# Patient Record
Sex: Male | Born: 1985
Health system: Southern US, Community
[De-identification: ages and names within clinical notes are randomized; demographics above are authoritative.]

## PROBLEM LIST (undated history)

## (undated) DIAGNOSIS — Z87442 Personal history of urinary calculi: Secondary | ICD-10-CM

## (undated) DIAGNOSIS — G4700 Insomnia, unspecified: Secondary | ICD-10-CM

## (undated) DIAGNOSIS — T7840XA Allergy, unspecified, initial encounter: Secondary | ICD-10-CM

## (undated) DIAGNOSIS — K219 Gastro-esophageal reflux disease without esophagitis: Secondary | ICD-10-CM

## (undated) HISTORY — DX: Gastro-esophageal reflux disease without esophagitis: K21.9

## (undated) HISTORY — DX: Personal history of urinary calculi: Z87.442

## (undated) HISTORY — DX: Allergy, unspecified, initial encounter: T78.40XA

---

## 2008-01-13 ENCOUNTER — Ambulatory Visit (HOSPITAL_COMMUNITY): Admission: EM | Admit: 2008-01-13 | Discharge: 2008-01-13 | Payer: Self-pay | Admitting: Emergency Medicine

## 2008-01-13 HISTORY — PX: FACIAL RECONSTRUCTION SURGERY: SHX631

## 2008-01-20 ENCOUNTER — Emergency Department (HOSPITAL_COMMUNITY): Admission: EM | Admit: 2008-01-20 | Discharge: 2008-01-20 | Payer: Self-pay | Admitting: Emergency Medicine

## 2010-06-07 NOTE — Op Note (Signed)
Timothy Fleming, Timothy Fleming                ACCOUNT NO.:  [id number]   MEDICAL RECORD NO.:  [id number]          PATIENT TYPE:  OBV   LOCATION:  2550                         FACILITY:  [facility]   PHYSICIAN:  Lucky Cowboy, MD         DATE OF BIRTH:  1985-12-20   DATE OF PROCEDURE:  01/13/2008  DATE OF DISCHARGE:  01/13/2008                               OPERATIVE REPORT   PREOPERATIVE DIAGNOSES:  Complex tip of nose laceration, right forehead  laceration in vertical direction, left lateral canthal lacerations  sparing the actual canthus, 1 cm deep to the muscle.   INDICATIONS:  The patient is a 25 year old male who was working on his  girlfriend's car in the [employer].  The car was suspended on  one jack and unfortunately became unstable falling on his nose, as he  was lying on the ground on his back side.  This resulted in a sharp  laceration up through both sides at the junction of the nasal ala and  the cheek.  It then transected the columella transecting the most  inferior portion of the medial crura footplate.  It also degloved the  dome from the septal cartilage.  There are lacerations extending on the  medial surfaces of the mucosa as well.  The left lateral eyelid  restoration was approximately 1 cm and through skin and underlying bone.  There was a functional component to it.  The right brow and forehead  laceration was 2 cm.   PROCEDURE:  The patient was taken to the operating room and placed on  the table in the supine position.  He was then placed under general  endotracheal anesthesia and the face was prepped with Betadine and  draped in the usual sterile fashion.  He was then rotated  counterclockwise 90 degrees.  Then 1% lidocaine with a 1:100,000 of  epinephrine was used to inject the perinasal space to ensure hemostasis.  Once this was performed, the area was copiously irrigated with normal  saline.  At this point, all cartilaginous structures were reapproximated  in a  simple interrupted fashion using 4-0 Monocryl.  This was also used  to approximate the deep subcutaneous tissues in the nasal ala and nasal  dome as well.  Other sutures used were 4-0 chromic as well as 5-0 and 6-  0 Prolene.  In this manner, the entire nose was brought back into  appropriate approximation.  On the left side, he did have an extension  of the laceration of 3 cm which was closed in a simple interrupted  fashion using 6-0 Prolene.  The forehead was closed in a simple  interrupted fashion using 5-0 Prolene.  The left eye was closed by  reapproximating subcutaneous muscle tissue using 4-0 Vicryl.  The skin  was closed in a interrupted stitch using 5-0 Prolene.  Bacitracin was  applied.  This was done to all wounds.  The patient was awakened from  anesthesia and taken to the postanesthesia care unit in stable  condition.  There were no complications.      Sera  Gerilyn Pilgrim, MD  Electronically Signed     SJ/MEDQ  D:  01/13/2008  T:  01/14/2008  Job:  [id number]   cc:   [facility]  Cherylynn Ridges, M.D.  Esther Hardy

## 2010-08-17 ENCOUNTER — Emergency Department: Payer: Self-pay | Admitting: Emergency Medicine

## 2010-10-28 LAB — POCT I-STAT 4, (NA,K, GLUC, HGB,HCT)
Hemoglobin: 15.6 g/dL (ref 13.0–17.0)
Potassium: 4.1 mEq/L (ref 3.5–5.1)
Sodium: 141 mEq/L (ref 135–145)

## 2012-04-05 ENCOUNTER — Emergency Department: Payer: Self-pay | Admitting: Emergency Medicine

## 2012-04-06 LAB — CBC
HCT: 44.8 % (ref 40.0–52.0)
HGB: 15.2 g/dL (ref 13.0–18.0)
Platelet: 407 10*3/uL (ref 150–440)
RDW: 13.3 % (ref 11.5–14.5)

## 2012-04-06 LAB — COMPREHENSIVE METABOLIC PANEL
BUN: 14 mg/dL (ref 7–18)
Bilirubin,Total: 0.6 mg/dL (ref 0.2–1.0)
Calcium, Total: 9.1 mg/dL (ref 8.5–10.1)
Creatinine: 1.27 mg/dL (ref 0.60–1.30)
EGFR (African American): >60
Osmolality: 270 (ref 275–301)
Total Protein: 8.3 g/dL — ABNORMAL HIGH (ref 6.4–8.2)

## 2012-04-06 LAB — SEDIMENTATION RATE: Erythrocyte Sed Rate: 2 mm/hr (ref 0–15)

## 2012-04-16 ENCOUNTER — Emergency Department: Payer: Self-pay | Admitting: Emergency Medicine

## 2012-04-16 LAB — HEPATIC FUNCTION PANEL A (ARMC): SGPT (ALT): 58 U/L (ref 12–78)

## 2012-04-16 LAB — BASIC METABOLIC PANEL
Anion Gap: 8 (ref 7–16)
BUN: 17 mg/dL (ref 7–18)
Chloride: 102 mmol/L (ref 98–107)
Co2: 26 mmol/L (ref 21–32)
Creatinine: 1.15 mg/dL (ref 0.60–1.30)
Glucose: 93 mg/dL (ref 65–99)
Sodium: 136 mmol/L (ref 136–145)

## 2012-04-16 LAB — CBC WITH DIFFERENTIAL/PLATELET
HCT: 45.6 % (ref 40.0–52.0)
HGB: 15.1 g/dL (ref 13.0–18.0)
Lymphocyte #: 7.8 10*3/uL — ABNORMAL HIGH (ref 1.0–3.6)
Lymphocyte %: 38.7 %
MCH: 29.6 pg (ref 26.0–34.0)
MCV: 89 fL (ref 80–100)
Neutrophil #: 10 10*3/uL — ABNORMAL HIGH (ref 1.4–6.5)

## 2012-05-20 ENCOUNTER — Ambulatory Visit: Payer: Self-pay | Admitting: Rheumatology

## 2012-11-18 ENCOUNTER — Emergency Department: Payer: Self-pay | Admitting: Emergency Medicine

## 2012-11-18 LAB — BASIC METABOLIC PANEL
Anion Gap: 7 (ref 7–16)
BUN: 14 mg/dL (ref 7–18)
Chloride: 105 mmol/L (ref 98–107)
Co2: 24 mmol/L (ref 21–32)
Creatinine: 1.21 mg/dL (ref 0.60–1.30)
EGFR (Non-African Amer.): >60
Glucose: 106 mg/dL — ABNORMAL HIGH (ref 65–99)
Osmolality: 273 (ref 275–301)
Sodium: 136 mmol/L (ref 136–145)

## 2012-11-18 LAB — CBC
MCH: 30 pg (ref 26.0–34.0)
MCHC: 34.3 g/dL (ref 32.0–36.0)
RBC: 5.06 10*6/uL (ref 4.40–5.90)
RDW: 13.2 % (ref 11.5–14.5)

## 2012-11-18 LAB — URINALYSIS, COMPLETE
Bilirubin,UR: NEGATIVE
Glucose,UR: NEGATIVE mg/dL (ref 0–75)
Hyaline Cast: 17
Squamous Epithelial: NONE SEEN

## 2014-05-15 NOTE — Consult Note (Signed)
PATIENT NAME:  Timothy Fleming, Timothy Fleming MR#:  [id number] DATE OF BIRTH:  1986/01/13  DATE OF CONSULTATION:  04/16/2012  CONSULTING PHYSICIAN:  Gladstone Lighter, MD  REQUESTING PHYSICIAN: Dr. Eula Listen.  PRIMARY CARE PHYSICIAN: [facility].   REASON FOR CONSULTATION: HELP WITH POSSIBLE ALLERGIC REACTION and dyspnea.   HISTORY OF PRESENT ILLNESS: The patient is a young, 29 year old, Caucasian male with no prior past medical history who has been having a rash on his lower extremities for the past couple of weeks, presents to the ER secondary to difficulty breathing that started this morning. The patient was doing fine up until 2 weeks ago, started with a sore throat which was presumed to be strep throat. He was started on PENICILLIN ANTIBIOTIC, IMMEDIATELY DEVELOPED URTICARIA, A RASH. He was started on a Z-Pak, but was stopped. Nonblanching, pruritic painful burning rash of his lower extremities going onto his lower abdomen. He presented to the ER. He was started on prednisone for a possible vasculitis and was advised to follow up with his PCP. His PCP has started tapering on the prednisone, and the patient just finished his prednisone yesterday. He said while on the prednisone his old rash seemed to be fading off, but he still started developing a new rash. He still does not have any rash on his upper torso, his arms, and his face. The rash on the lower extremities is burning and hurts his knees and ankles at times when he walks, and is also itching at times. He was doing fine yesterday, and this morning went to work. Felt like his throat was closing up, so he immediately went home, but he did not find anybody at home, so he panicked and felt more difficulty swallowing, could not breathe.  He does have an EpiPen at home because [place], so he immediately injected EpiPen, called his PCPs office and was advised to come to the ER. He does not have any swelling of his  oropharyngeal structures, his lips, or his throat on exam. He was tachycardic, presumably secondary to his EpiPen, but otherwise stable clinically. After a dose of Solu-Medrol, he felt he was feeling better and is able to eat solid food at this time. His labs do not show any abnormalities; and according to the patient, his rash has not progressed in the past few days. Of note, the rash is photosensitive as well.  A medical consult has been requested to see if the patient needs admission at this time. He does not have any fever or other symptoms.   PAST MEDICAL HISTORY: 1.  Lactose intolerance when a child.  2.  Seasonal allergies.  3.  Recent possible diagnosis of vasculitis with lower extremity rash.   PAST SURGICAL HISTORY: Facial reconstruction surgery after trauma.   ALLERGIES TO MEDICATIONS:  1.  PENICILLIN ALLERGY.  2.  BEE STINGS.  3.  RASH TO CODEINE.   HOME MEDICATIONS:  1.  Takes Vicodin p.r.n. for his joint pain that started in the last week.  2.  Was on prednisone and stopped yesterday as he finished his taper for possible vasculitis.  3.  Ambien at bedtime in the last couple of days, which was started for insomnia.    SOCIAL HISTORY: Lives at home with his wife. Smokes about 1/2 to 1 pack per day.  Social drinking. Works for the [employer].   FAMILY HISTORY: Seasonal allergies, hay fever in the family, and also diabetes. No stroke or cancer.  REVIEW OF SYSTEMS:  CONSTITUTIONAL: No fever, fatigue, or weakness.  EYES: No blurred vision, double vision, glaucoma or cataracts.  ENT: No tinnitus, ear pain, hearing loss, epistaxis or discharge.  RESPIRATORY: No cough, wheeze, hemoptysis or COPD.  CARDIOVASCULAR: No chest pain, orthopnea, edema, arrhythmia, palpitations, or syncope.  GASTROINTESTINAL: No nausea, vomiting, diarrhea, abdominal pain, hematemesis, or melena.  GENITOURINARY: No dysuria, hematuria, urinary incontinence or frequency.  ENDOCRINE: No polyuria,  nocturia, thyroid problems, heat or cold intolerance.  HEMATOLOGY: No anemia, easy bruising or bleeding.  SKIN: Positive for rash. No other lesions or ulcers.   MUSCULOSKELETAL: Location at ankle and knee pain. No low back pain, shoulder pain, or arthritis or gout.  NEUROLOGIC: No numbness, weakness, CVA, TIA, or seizures.  PSYCHOLOGICAL: No anxiety, insomnia, depression.   PHYSICAL EXAMINATION: VITAL SIGNS: Temperature 98.2 degrees Fahrenheit, pulse 96, respirations 32, blood pressure 152/62, pulse ox 98% on room air.  GENERAL: Well-built, well-nourished male sitting in bed, not in any acute distress.  HEENT: Normocephalic, atraumatic. Pupils equal, round, reacting to light. Anicteric sclerae. Extraocular movements intact.  OROPHARYNX: Clear. No erythema, mass or exudates. No swelling of his uvula, posterior pharyngeal structures. His tongue and his lips are within normal limits.  NECK: Supple. No thyromegaly, JVD or carotid bruits. No lymphadenopathy. No stridor.  LUNGS: Clear to auscultation. No wheeze or crackles. No use of accessory muscles for breathing.  CARDIOVASCULAR: S1, S2 regular rate and rhythm. No murmurs, rubs, or gallops.  ABDOMEN: Soft, nontender, nondistended. No hepatosplenomegaly. Normal bowel sounds.  EXTREMITIES: No pedal edema. No clubbing or cyanosis; 2+ dorsalis pedis pulses palpable bilaterally.  SKIN: There is non-blanching, purpuric rash on both lower extremities and also infraumbilical on his abdomen. Nontender to touch, but does have sensory change, sensory abnormalities including burning pain, especially on ambulating.  LYMPHATICS: No cervical or inguinal lymphadenopathy.  NEUROLOGICAL: Cranial nerves intact. No focal motor or sensory deficits.  PSYCHOLOGICAL: The patient is awake, alert, oriented x3.   LABORATORY DATA: WBC 20.1, hemoglobin 15.1, hematocrit 35.6, platelet count 511.   Sodium 136, potassium 3.4, chloride 102, bicarbonate 26, BUN 17, creatinine  1.15, glucose 93, calcium of 9.3.   Total bilirubin 0.5, ALT 58, AST 28, alk phos 84, and albumin of 4.3.   RECOMMENDATIONS: A 29 year old male with no significant medical problems other than recent diagnosis of possible vasculitis based on a rash on his lower extremities, who was on prednisone, comes to the ER secondary to difficulty breathing.  1.  Difficulty breathing: Possibly secondary to allergic reaction, rebound after he stopped prednisone, but he does not have any swelling on exam. No lip, tongue, or pharyngeal tissue swelling. Probably mild difficulty breathing, got worsened by his anxiety. Now he is able to eat well, breathe better. No abnormality on physical examination, so I am going to restart his prednisone, but he might need a vasculitis workup and possible referral to either dermatology or rheumatology if the rash does not improve. He already has a followup appointment with his PCP in 5 days, so I advised to keep that up. His labs are stable, and he is being discharged from the ER.  2.  Leukocytosis: Is secondary to steroids. Monitor while off of steroids. 3.  Possible vasculitis: Again, as mentioned above, started on prednisone. Will need referral if the rash does not improve to either dermatology or rheumatology. The patient already had PCP followup in 5 days.   Plan has been  has been discussed with the family, whether to admit  him under observation versus discharge home. Family wanted him to be discharged as they can monitor him at home, and he is otherwise clinically stable for discharge.   Total time spent on consultation: Fifty minutes.       ____________________________ Gladstone Lighter, MD rk:dm D: 04/16/2012 14:56:00 ET T: 04/16/2012 15:30:34 ET JOB#: [id number]  cc: Nelda Severe. Burt Ek, MD Gladstone Lighter, MD, <Dictator>   Gladstone Lighter MD ELECTRONICALLY SIGNED 04/26/2012 14:44

## 2014-11-04 LAB — LIPID PANEL
Cholesterol: 191 mg/dL (ref 0–200)
HDL: 37 mg/dL (ref 35–70)
LDL CALC: 125 mg/dL
TRIGLYCERIDES: 145 mg/dL (ref 40–160)

## 2014-11-04 LAB — BASIC METABOLIC PANEL: GLUCOSE: 82 mg/dL

## 2014-11-10 ENCOUNTER — Encounter: Payer: Self-pay | Admitting: Family Medicine

## 2014-11-10 ENCOUNTER — Ambulatory Visit (INDEPENDENT_AMBULATORY_CARE_PROVIDER_SITE_OTHER): Payer: BLUE CROSS/BLUE SHIELD | Admitting: Family Medicine

## 2014-11-10 ENCOUNTER — Encounter (INDEPENDENT_AMBULATORY_CARE_PROVIDER_SITE_OTHER): Payer: Self-pay

## 2014-11-10 VITALS — BP 132/84 | HR 82 | Temp 98.0°F | Resp 18 | Ht 72.0 in | Wt 253.2 lb

## 2014-11-10 DIAGNOSIS — E669 Obesity, unspecified: Secondary | ICD-10-CM | POA: Diagnosis not present

## 2014-11-10 DIAGNOSIS — Z91038 Other insect allergy status: Secondary | ICD-10-CM | POA: Diagnosis not present

## 2014-11-10 DIAGNOSIS — Z23 Encounter for immunization: Secondary | ICD-10-CM | POA: Diagnosis not present

## 2014-11-10 DIAGNOSIS — F329 Major depressive disorder, single episode, unspecified: Secondary | ICD-10-CM

## 2014-11-10 DIAGNOSIS — J302 Other seasonal allergic rhinitis: Secondary | ICD-10-CM | POA: Diagnosis not present

## 2014-11-10 DIAGNOSIS — Z720 Tobacco use: Secondary | ICD-10-CM

## 2014-11-10 DIAGNOSIS — K219 Gastro-esophageal reflux disease without esophagitis: Secondary | ICD-10-CM

## 2014-11-10 DIAGNOSIS — F418 Other specified anxiety disorders: Secondary | ICD-10-CM | POA: Diagnosis not present

## 2014-11-10 DIAGNOSIS — R0683 Snoring: Secondary | ICD-10-CM | POA: Diagnosis not present

## 2014-11-10 DIAGNOSIS — G4700 Insomnia, unspecified: Secondary | ICD-10-CM | POA: Diagnosis not present

## 2014-11-10 DIAGNOSIS — Z87442 Personal history of urinary calculi: Secondary | ICD-10-CM | POA: Insufficient documentation

## 2014-11-10 DIAGNOSIS — Z91030 Bee allergy status: Secondary | ICD-10-CM

## 2014-11-10 DIAGNOSIS — F32A Depression, unspecified: Secondary | ICD-10-CM

## 2014-11-10 DIAGNOSIS — F419 Anxiety disorder, unspecified: Secondary | ICD-10-CM

## 2014-11-10 MED ORDER — TRAZODONE HCL 50 MG PO TABS: 50.0000 mg | ORAL_TABLET | Freq: Every day | ORAL | Status: DC

## 2014-11-10 MED ORDER — BUPROPION HCL ER (XL) 150 MG PO TB24: 150.0000 mg | ORAL_TABLET | Freq: Every day | ORAL | Status: DC

## 2014-11-10 MED ORDER — EPINEPHRINE 0.3 MG/0.3ML IJ SOAJ: 0.3000 mg | Freq: Once | INTRAMUSCULAR | Status: DC

## 2014-11-10 MED ORDER — OMEPRAZOLE 20 MG PO CPDR: 20.0000 mg | DELAYED_RELEASE_CAPSULE | Freq: Two times a day (BID) | ORAL | Status: DC

## 2014-11-10 NOTE — Progress Notes (Signed)
Name: Timothy Fleming   MRN: [id number]    DOB: [birth year]   Date:11/10/2014       Progress Note  Subjective  Chief Complaint  Chief Complaint  Patient presents with  . Establish Care  . Gastroesophageal Reflux    About to run out of medication and needs refill-well controlled with medication    HPI  GERD: he states symptoms have been controlled, taking Omeprazole every am, and sometimes in pm. He denies recent heartburn or regurgitation. He tries to avoid spicy food, only has it occasionally  Tobacco use: he is trying to quit, smoking about 2-3 cigarettes daily and switching to e-cigarettes with the intension of quitting. Explained that there are no studies about e-cigarettes and tobacco cessation, but there are with nicotine patch and gum, also Zyban and also Chantix. He states the patch causes a rash, does not like to chew anything but is willing to try Chantix again.  He tried in the past, but was also working with somebody that smoked and it did not work at the time.   Seasonal Allergic Rhinitis: usually in the Spring and Fall, usually nasal and chest congestion, with itchy and watery eyes.  He usually takes prn otc medication or flonase. Not using at this time  Obesity: he eats out every day at lunch.  He does not pack his food. His BMI is high, bp slightly up but no HTN yet  Snoring: ESS of 3 - states snores at times, wakes up feeling tired  Insomnia: he has a long history of difficulty falling and staying asleep. He states it has been worse over the past 10 months when he switch jobs. 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 However more stress under the new boss. Regretting the move. Always thinking about his work. He denies being snappy at home, wife has not noticed a change in mood, but he feels stressed out.   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   Patient Active Problem List   Diagnosis Date Noted  . GERD without esophagitis 11/10/2014  . Seasonal allergic rhinitis 11/10/2014  . History of nephrolithiasis 11/10/2014  . Obesity (BMI 30-39.9) 11/10/2014  . Tobacco abuse 11/10/2014  . Snoring 11/10/2014  . Insomnia 11/10/2014    Past Surgical History  Procedure Laterality Date  . Facial reconstruction surgery  01/13/2008    After a car fell on him while working on it    Family History  Problem Relation Age of Onset  . Varicose Veins Mother   . Endometriosis Mother   . Drug abuse Father     Social History   Social History  . Marital Status: Married    Spouse Name: N/A  . Number of Children: N/A  . Years of Education: N/A   Occupational History  . Not on file.   Social History Main Topics  . Smoking status: Current Every Day Smoker -- 0.25 packs/day for 15 years    Types: Cigarettes    Start date: 11/10/1999  . Smokeless tobacco: Never Used  . Alcohol Use: No  . Drug Use: No  . Sexual Activity:    Partners: Female   Other Topics Concern  . Not on file   Social History Narrative  . No narrative on file     Current outpatient prescriptions:  .  omeprazole (PRILOSEC) 20 MG capsule, Take 1 capsule (20 mg total) by mouth 2 (two) times daily before a meal., Disp: 60 capsule, Rfl: 5 .  EPINEPHrine (EPIPEN 2-PAK) 0.3 mg/0.3 mL IJ SOAJ injection, Inject 0.3 mLs (0.3 mg total) into the muscle once., Disp: 1 Device, Rfl: 1  Allergies  Allergen Reactions  . Bee Venom Anaphylaxis, Shortness Of Breath and Swelling  . Penicillins Hives    vasculitis     ROS  Constitutional: Negative for fever or weight change.  Respiratory: Negative for cough and shortness of breath.   Cardiovascular: Negative for chest pain or palpitations.  Gastrointestinal: Negative for abdominal pain, no bowel changes.  Musculoskeletal: Negative for gait problem or joint swelling.  Skin:  Negative for rash.  Neurological: Negative for dizziness or headache.  No other specific complaints in a complete review of systems (except as listed in HPI above).  Objective  Filed Vitals:   11/10/14 1529  BP: 132/84  Pulse: 82  Temp: 98 F (36.7 C)  TempSrc: Oral  Resp: 18  Height: 6' (1.829 m)  Weight: 253 lb 3.2 oz (114.851 kg)  SpO2: 97%    Body mass index is 34.33 kg/(m^2).  Physical Exam  Constitutional: Patient appears well-developed and well-nourished. No distress.  HENT: Head: Normocephalic and atraumatic. Ears: B TMs ok, no erythema or effusion; Nose: Nose normal. Mouth/Throat: Oropharynx is clear and moist. No oropharyngeal exudate.  Eyes: Conjunctivae and EOM are normal. Pupils are equal, round, and reactive to light. No scleral icterus.  Neck: Normal range of motion. Neck supple. No JVD present. No thyromegaly present.  Cardiovascular: Normal rate, regular rhythm and normal heart sounds.  No murmur heard. No BLE edema. Pulmonary/Chest: Effort normal and breath sounds normal. No respiratory distress. Abdominal: Soft. Bowel sounds are normal, no distension. There is no tenderness. no masses Musculoskeletal: Normal range of motion, no joint effusions. No gross deformities Neurological: he is alert and oriented to person, place, and time. No cranial nerve deficit. Coordination, balance, strength, speech and gait are normal.  Skin: Skin is warm and dry. No rash noted. No erythema.  Psychiatric: Patient has a normal mood and affect. behavior is normal. Judgment and thought content normal.  PHQ2/9: Depression screen PHQ 2/9 11/10/2014  Decreased Interest 0  Down, Depressed, Hopeless 0  PHQ - 2 Score 0    Fall Risk: Fall Risk  11/10/2014  Falls in the past year? No    Functional Status Survey: Is the patient deaf or have difficulty hearing?: No Does the patient have difficulty seeing, even when wearing glasses/contacts?: Yes (glasses) Does the patient have  difficulty concentrating, remembering, or making decisions?: No Does the patient have difficulty walking or climbing stairs?: No Does the patient have difficulty dressing or bathing?: No Does the patient have difficulty doing errands alone such as visiting a doctor's office or shopping?: No   Assessment & Plan  1. GERD without esophagitis  Advised to try weaning self off, possible side effects discussed with patient - omeprazole (PRILOSEC) 20 MG capsule; Take 1 capsule (20 mg total) by mouth 2 (two) times daily before a meal.  Dispense: 60 capsule; Refill: 5  2. Needs flu shot   - Flu Vaccine QUAD 36+ mos PF IM (Fluarix & Fluzone Quad PF)  3. Seasonal allergic rhinitis  Doing well with otc medication   4. Obesity (BMI 30-39.9)  Discussed with the patient the risk posed by an increased BMI. Discussed importance of portion control, calorie counting and at  least 150 minutes of physical activity weekly. Avoid sweet beverages and drink more water. Eat at least 6 servings of fruit and vegetables daily   5. Tobacco abuse  Discussed medication, but because of stress/mild depression, we will try Wellbutrin instead of Chantix for now. Advised to pick a quit date and motivate wife to quit smoking also  6. Snoring  ESS of 3 no need for sleep study at this time  7. Insomnia  - traZODone (DESYREL) 50 MG tablet; Take 1-2 tablets (50-100 mg total) by mouth at bedtime.  Dispense: 60 tablet; Refill: 0  8. Anxiety and depression  - buPROPion (WELLBUTRIN XL) 150 MG 24 hr tablet; Take 1 tablet (150 mg total) by mouth daily.  Dispense: 30 tablet; Refill: 0  9. Bee sting allergy  - EPINEPHrine (EPIPEN 2-PAK) 0.3 mg/0.3 mL IJ SOAJ injection; Inject 0.3 mLs (0.3 mg total) into the muscle once.  Dispense: 1 Device; Refill: 1

## 2014-11-12 ENCOUNTER — Encounter: Payer: Self-pay | Admitting: Family Medicine

## 2014-11-30 DIAGNOSIS — S63502A Unspecified sprain of left wrist, initial encounter: Secondary | ICD-10-CM | POA: Insufficient documentation

## 2014-12-02 ENCOUNTER — Other Ambulatory Visit: Payer: Self-pay | Admitting: Family Medicine

## 2014-12-02 DIAGNOSIS — G4700 Insomnia, unspecified: Secondary | ICD-10-CM

## 2014-12-02 DIAGNOSIS — F419 Anxiety disorder, unspecified: Secondary | ICD-10-CM

## 2014-12-02 DIAGNOSIS — Z720 Tobacco use: Secondary | ICD-10-CM

## 2014-12-02 DIAGNOSIS — F329 Major depressive disorder, single episode, unspecified: Secondary | ICD-10-CM

## 2014-12-02 DIAGNOSIS — F32A Depression, unspecified: Secondary | ICD-10-CM

## 2014-12-02 MED ORDER — BUPROPION HCL ER (XL) 150 MG PO TB24: 150.0000 mg | ORAL_TABLET | Freq: Every day | ORAL | Status: DC

## 2014-12-02 MED ORDER — TRAZODONE HCL 50 MG PO TABS: 50.0000 mg | ORAL_TABLET | Freq: Every day | ORAL | Status: DC

## 2014-12-02 NOTE — Telephone Encounter (Signed)
Patient had appointment but had to reschedule for December due to doctor not being in the office. He will be completely out of Wellbutrin and Trazadone by his appointment time. [facility]

## 2014-12-10 ENCOUNTER — Ambulatory Visit: Payer: BLUE CROSS/BLUE SHIELD | Admitting: Family Medicine

## 2015-01-08 ENCOUNTER — Encounter: Payer: Self-pay | Admitting: Family Medicine

## 2015-01-08 ENCOUNTER — Ambulatory Visit (INDEPENDENT_AMBULATORY_CARE_PROVIDER_SITE_OTHER): Payer: BLUE CROSS/BLUE SHIELD | Admitting: Family Medicine

## 2015-01-08 DIAGNOSIS — K219 Gastro-esophageal reflux disease without esophagitis: Secondary | ICD-10-CM | POA: Diagnosis not present

## 2015-01-08 DIAGNOSIS — F32A Depression, unspecified: Secondary | ICD-10-CM

## 2015-01-08 DIAGNOSIS — Z720 Tobacco use: Secondary | ICD-10-CM | POA: Diagnosis not present

## 2015-01-08 DIAGNOSIS — F418 Other specified anxiety disorders: Secondary | ICD-10-CM

## 2015-01-08 DIAGNOSIS — G4700 Insomnia, unspecified: Secondary | ICD-10-CM

## 2015-01-08 DIAGNOSIS — F419 Anxiety disorder, unspecified: Secondary | ICD-10-CM

## 2015-01-08 DIAGNOSIS — F329 Major depressive disorder, single episode, unspecified: Secondary | ICD-10-CM

## 2015-01-08 MED ORDER — BUPROPION HCL ER (XL) 300 MG PO TB24: 300.0000 mg | ORAL_TABLET | Freq: Every day | ORAL | Status: DC

## 2015-01-08 MED ORDER — TRAZODONE HCL 50 MG PO TABS: 50.0000 mg | ORAL_TABLET | Freq: Every day | ORAL | Status: DC

## 2015-01-08 MED ORDER — OMEPRAZOLE 20 MG PO CPDR: 20.0000 mg | DELAYED_RELEASE_CAPSULE | Freq: Two times a day (BID) | ORAL | Status: DC

## 2015-01-08 MED ORDER — OMEPRAZOLE 20 MG PO CPDR: 20.0000 mg | DELAYED_RELEASE_CAPSULE | Freq: Every day | ORAL | Status: DC

## 2015-01-08 NOTE — Progress Notes (Signed)
Name: Timothy Fleming   MRN: [id number]    DOB: 10/30/1985   Date:01/08/2015       Progress Note  Subjective  Chief Complaint  Chief Complaint  Patient presents with  . Medication Refill    follow-up  . Depression    injcreased stress at work, feels med is not working well  . Gastroesophageal Reflux    Patient states working great  . Insomnia    HPI  GERD: he states symptoms have been controlled, taking Omeprazole every am, he denies any heartburn or regurgitation.  Tobacco use: he is trying to quit, smoking about 1 cigarettes daily and  e-cigarettes with the intension of quitting. Explained that there are no studies about e-cigarettes and tobacco cessation, but there are with nicotine patch and gum, also Zyban and also Chantix. He states the patch causes a rash, does not like to chew anything but is willing to try Chantix again. He was started on Wellbutrin 2 months ago and states it seems to help decrease the urge to smoke.  Insomnia: he has a long history of difficulty falling and staying asleep. He states it has been worse over the past 1 years  when he switch jobs. 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 However more stress under the new boss. Regretting the move. Always thinking about his work. He denies being snappy at home, wife has not noticed a change in mood, but he feels stressed out. He states since started on Trazodone he is able to fall and stay asleep.  Anxiety/Depression: he states symptoms have gotten bad over the past few months, they had a meeting at work with different standards and he stood up and discussed why it could not be done, since than his USS has been giving him a hard time. He is still getting along with his immediate supervisor and his direct co-worker, but is feeling threatened and paranoid about losing his job. He is trying to switch positions at work.  Explained to him it is a city job and as long as he is following the rules he will be fine. His wife is very supportive. He is worrying all the time, no energy, lack of motivation when he gets home. He had one crying spell.   Patient Active Problem List   Diagnosis Date Noted  . Sprain of left wrist 11/30/2014  . GERD without esophagitis 11/10/2014  . Seasonal allergic rhinitis 11/10/2014  . History of nephrolithiasis 11/10/2014  . Obesity (BMI 30-39.9) 11/10/2014  . Tobacco abuse 11/10/2014  . Snoring 11/10/2014  . Insomnia 11/10/2014    Past Surgical History  Procedure Laterality Date  . Facial reconstruction surgery  01/13/2008    After a car fell on him while working on it    Family History  Problem Relation Age of Onset  . Varicose Veins Mother   . Endometriosis Mother   . Drug abuse Father     Social History   Social History  . Marital Status: Married    Spouse Name: N/A  . Number of Children: N/A  . Years of Education: N/A   Occupational History  . Not on file.   Social History Main Topics  . Smoking status: Current Every Day Smoker -- 0.25 packs/day for 15 years    Types: Cigarettes    Start date: 11/10/1999  . Smokeless tobacco: Never Used  . Alcohol  Use: No  . Drug Use: No  . Sexual Activity:    Partners: Female   Other Topics Concern  . Not on file   Social History Narrative     Current outpatient prescriptions:  .  buPROPion (WELLBUTRIN XL) 300 MG 24 hr tablet, Take 1 tablet (300 mg total) by mouth daily., Disp: 90 tablet, Rfl: 0 .  EPINEPHrine (EPIPEN 2-PAK) 0.3 mg/0.3 mL IJ SOAJ injection, Inject 0.3 mLs (0.3 mg total) into the muscle once., Disp: 1 Device, Rfl: 1 .  omeprazole (PRILOSEC) 20 MG capsule, Take 1 capsule (20 mg total) by mouth 2 (two) times daily before a meal., Disp: 180 capsule, Rfl: 1 .  traZODone (DESYREL) 50 MG tablet, Take 1-2 tablets (50-100 mg total) by mouth at bedtime., Disp: 180 tablet, Rfl: 0  Allergies  Allergen  Reactions  . Bee Venom Anaphylaxis, Shortness Of Breath and Swelling  . Penicillins Hives    vasculitis     ROS  Constitutional: Negative for fever, positive for mild  weight change.  Respiratory: Negative for cough and shortness of breath.   Cardiovascular: Negative for chest pain or palpitations.  Gastrointestinal: Negative for abdominal pain, no bowel changes.  Musculoskeletal: Negative for gait problem or joint swelling.  Skin: Negative for rash.  Neurological: Negative for dizziness or headache.  No other specific complaints in a complete review of systems (except as listed in HPI above).  Objective  Filed Vitals:   01/08/15 1551  BP: 128/82  Pulse: 84  Temp: 97.5 F (36.4 C)  TempSrc: Oral  Resp: 18  Height: 6' (1.829 m)  Weight: 255 lb 1.6 oz (115.713 kg)  SpO2: 96%    Body mass index is 34.59 kg/(m^2).  Physical Exam  Constitutional: Patient appears well-developed and well-nourished. Obese  No distress.  HEENT: head atraumatic, normocephalic, pupils equal and reactive to light neck supple, throat within normal limits Cardiovascular: Normal rate, regular rhythm and normal heart sounds.  No murmur heard. No BLE edema. Pulmonary/Chest: Effort normal and breath sounds normal. No respiratory distress. Abdominal: Soft.  There is no tenderness. Psychiatric: Patient has a normal mood and affect. behavior is normal. Judgment and thought content normal.  Recent Results (from the past 2160 hour(s))  Basic metabolic panel     Status: None   Collection Time: 11/04/14 12:00 AM  Result Value Ref Range   Glucose 82 mg/dL  Lipid panel     Status: None   Collection Time: 11/04/14 12:00 AM  Result Value Ref Range   Triglycerides 145 40 - 160 mg/dL   Cholesterol 161 0 - 096 mg/dL   HDL 37 35 - 70 mg/dL   LDL Cholesterol 045 mg/dL     WUJ8/1: Depression screen PHQ 2/9 11/10/2014  Decreased Interest 0  Down, Depressed, Hopeless 0  PHQ - 2 Score 0     Fall  Risk: Fall Risk  11/10/2014  Falls in the past year? No    Assessment & Plan  1. Insomnia  Doing well - traZODone (DESYREL) 50 MG tablet; Take 1-2 tablets (50-100 mg total) by mouth at bedtime.  Dispense: 180 tablet; Refill: 0  2. Tobacco abuse  Trying to quit - buPROPion (WELLBUTRIN XL) 300 MG 24 hr tablet; Take 1 tablet (300 mg total) by mouth daily.  Dispense: 90 tablet; Refill: 0  3. Anxiety and depression  We will try higher dose of Wellbutrin XL  - buPROPion (WELLBUTRIN XL) 300 MG 24 hr tablet; Take 1 tablet (300 mg total)  by mouth daily.  Dispense: 90 tablet; Refill: 0  4. GERD without esophagitis  Well controlled - omeprazole (PRILOSEC) 20 MG capsule; Take 1 capsule (20 mg total) by mouth daily.  Dispense: 90 capsule; Refill: 0

## 2015-01-20 ENCOUNTER — Ambulatory Visit (INDEPENDENT_AMBULATORY_CARE_PROVIDER_SITE_OTHER): Payer: BLUE CROSS/BLUE SHIELD | Admitting: Family Medicine

## 2015-01-20 ENCOUNTER — Encounter: Payer: Self-pay | Admitting: Family Medicine

## 2015-01-20 VITALS — BP 120/70 | HR 84 | Temp 97.6°F | Resp 18 | Ht 72.0 in | Wt 249.3 lb

## 2015-01-20 DIAGNOSIS — J0100 Acute maxillary sinusitis, unspecified: Secondary | ICD-10-CM

## 2015-01-20 DIAGNOSIS — J209 Acute bronchitis, unspecified: Secondary | ICD-10-CM | POA: Insufficient documentation

## 2015-01-20 DIAGNOSIS — J40 Bronchitis, not specified as acute or chronic: Secondary | ICD-10-CM | POA: Diagnosis not present

## 2015-01-20 MED ORDER — HYDROCOD POLST-CPM POLST ER 10-8 MG/5ML PO SUER: 5.0000 mL | Freq: Two times a day (BID) | ORAL | Status: DC | PRN

## 2015-01-20 MED ORDER — AZITHROMYCIN 500 MG PO TABS: 500.0000 mg | ORAL_TABLET | Freq: Every day | ORAL | Status: DC

## 2015-01-20 NOTE — Patient Instructions (Signed)

## 2015-01-20 NOTE — Progress Notes (Signed)
Name: Timothy Fleming   MRN: [id number]    DOB: 02/03/1985   Date:01/20/2015       Progress Note  Subjective  Chief Complaint  Chief Complaint  Patient presents with  . URI    cough, congested, vomiting, chills for 5 days    HPI  Patient is here today with concerns regarding the following symptoms congestion, ear pressure, sinus pressure, productive cough and achiness that started 5 days ago.  Associated with fatigue and malaise. Not associated with rash, recent travel, sick contacts, nausea, diarrhea. Did cough so hard she threw up once. Has tried the following home remedies: none. Did not go to work yesterday.    Past Medical History  Diagnosis Date  . GERD (gastroesophageal reflux disease)   . Allergy     Seasonal  . History of kidney stones     x4    Social History  Substance Use Topics  . Smoking status: Current Every Day Smoker -- 0.25 packs/day for 15 years    Types: Cigarettes    Start date: 11/10/1999  . Smokeless tobacco: Never Used  . Alcohol Use: No     Current outpatient prescriptions:  .  buPROPion (WELLBUTRIN XL) 300 MG 24 hr tablet, Take 1 tablet (300 mg total) by mouth daily., Disp: 90 tablet, Rfl: 0 .  EPINEPHrine (EPIPEN 2-PAK) 0.3 mg/0.3 mL IJ SOAJ injection, Inject 0.3 mLs (0.3 mg total) into the muscle once., Disp: 1 Device, Rfl: 1 .  omeprazole (PRILOSEC) 20 MG capsule, Take 1 capsule (20 mg total) by mouth daily., Disp: 90 capsule, Rfl: 0 .  traZODone (DESYREL) 50 MG tablet, Take 1-2 tablets (50-100 mg total) by mouth at bedtime., Disp: 180 tablet, Rfl: 0  Allergies  Allergen Reactions  . Bee Venom Anaphylaxis, Shortness Of Breath and Swelling  . Penicillins Hives    vasculitis    ROS  Positive for fatigue, nasal congestion, sinus pressure, ear fullness, cough as mentioned in HPI, otherwise all systems reviewed and are negative.  Objective  Filed Vitals:   01/20/15 1009  BP: 120/70  Pulse: 84  Temp: 97.6 F (36.4 C)  TempSrc: Oral   Resp: 18  Height: 6' (1.829 m)  Weight: 249 lb 4.8 oz (113.082 kg)  SpO2: 97%   Body mass index is 33.8 kg/(m^2).   Physical Exam  Constitutional: Patient appears well-developed and well-nourished. In no acute distress but does appear to be fatigued from acute illness. HEENT:  - Head: Normocephalic and atraumatic.  - Ears: RIGHT TM bulging with minimal clear exudate, LEFT TM bulging with minimal clear exudate.  - Nose: Nasal mucosa boggy and congested.  - Mouth/Throat: Oropharynx is moist with slight erythema of bilateral tonsils without hypertrophy or exudates. Post nasal drainage present.  - Eyes: Conjunctivae clear, EOM movements normal. PERRLA. No scleral icterus.  Neck: Normal range of motion. Neck supple. No JVD present. No thyromegaly present. No local lymphadenopathy. Cardiovascular: Regular rate, regular rhythm with no murmurs heard.  Pulmonary/Chest: Effort normal and breath sounds clear in all lung fields with exception to rhonchi in upper anterior airways. Musculoskeletal: Normal range of motion bilateral UE and LE, no joint effusions. Skin: Skin is warm and dry. No rash noted. Psychiatric: Patient has a normal mood and affect. Behavior is normal in office today. Judgment and thought content normal in office today.   Assessment & Plan  1. Acute maxillary sinusitis, recurrence not specified Etiologies include initial allergic rhinitis or viral infection progressing to superimposed bacterial infection.  Instructed patient on increasing hydration, nasal saline spray, steam inhalation, NSAID if tolerated and not contraindicated. If not already doing so start taking daily anti-histamine and use a steroid nasal spray.  - azithromycin (ZITHROMAX) 500 MG tablet; Take 1 tablet (500 mg total) by mouth daily.  Dispense: 10 tablet; Refill: 0 - chlorpheniramine-HYDROcodone (TUSSIONEX PENNKINETIC ER) 10-8 MG/5ML SUER; Take 5 mLs by mouth every 12 (twelve) hours as needed for cough.   Dispense: 115 mL; Refill: 0  2. Bronchitis with bronchospasm Rest, hydration, cough medication prn.  - azithromycin (ZITHROMAX) 500 MG tablet; Take 1 tablet (500 mg total) by mouth daily.  Dispense: 10 tablet; Refill: 0 - chlorpheniramine-HYDROcodone (TUSSIONEX PENNKINETIC ER) 10-8 MG/5ML SUER; Take 5 mLs by mouth every 12 (twelve) hours as needed for cough.  Dispense: 115 mL; Refill: 0

## 2015-02-19 ENCOUNTER — Encounter: Payer: Self-pay | Admitting: Family Medicine

## 2015-02-19 ENCOUNTER — Ambulatory Visit (INDEPENDENT_AMBULATORY_CARE_PROVIDER_SITE_OTHER): Payer: BLUE CROSS/BLUE SHIELD | Admitting: Family Medicine

## 2015-02-19 VITALS — BP 116/64 | HR 96 | Temp 98.1°F | Resp 18 | Ht 72.0 in | Wt 249.7 lb

## 2015-02-19 DIAGNOSIS — K219 Gastro-esophageal reflux disease without esophagitis: Secondary | ICD-10-CM

## 2015-02-19 DIAGNOSIS — R112 Nausea with vomiting, unspecified: Secondary | ICD-10-CM

## 2015-02-19 DIAGNOSIS — G4700 Insomnia, unspecified: Secondary | ICD-10-CM

## 2015-02-19 DIAGNOSIS — F320 Major depressive disorder, single episode, mild: Secondary | ICD-10-CM | POA: Diagnosis not present

## 2015-02-19 MED ORDER — SUVOREXANT 10 MG PO TABS: 1.0000 | ORAL_TABLET | Freq: Every evening | ORAL | Status: DC

## 2015-02-19 MED ORDER — DULOXETINE HCL 30 MG PO CPEP: 30.0000 mg | ORAL_CAPSULE | Freq: Every day | ORAL | Status: DC

## 2015-02-19 NOTE — Progress Notes (Signed)
Name: Timothy Fleming   MRN: [id number]    DOB: 06-25-1985   Date:02/19/2015       Progress Note  Subjective  Chief Complaint  Chief Complaint  Patient presents with  . Follow-up    6 week F/U  . Anxiety and Depression    Worst with stress from boss, medication is not helping symptoms, can only tell if he does not take-patient will get aggravated. Would like to try another kind of medication  . Insomnia    Medication helps him go to sleep but does not stay asleep, will still wake up 4-5 times a night.  . Gastroesophageal Reflux    Symptoms are well controlled, and has had been nausea thinks due to stress.  . Nicotine Dependence    went back to smoking due to work stress    HPI   GERD: he states symptoms have been controlled, taking Omeprazole every am, he denies any heartburn or regurgitation, he has been vomiting during work days, right after his lunch, he states seems to be related to stress, since it does happen at home or during the weekends.  Tobacco use: he is trying to quit, smoking about 1 cigarettes daily and e-cigarettes with the intension of quitting. Explained that there are no studies about e-cigarettes and tobacco cessation, but there are with nicotine patch and gum, also Zyban and also Chantix. He states the patch causes a rash, does not like to chew anything but is willing to try Chantix again. He was started on Wellbutrin 3 months ago and states it seems to help decrease the urge to smoke, but is very stressed an unable to quit so far.  Insomnia: he has a long history of difficulty falling and staying asleep. He states it has been worse over the past 1 years when he switch jobs. 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 However more stress under the new boss. Regretting the move. Always thinking about his work. He denies being snappy at home, wife has not noticed a change in  mood, but he feels stressed out. He states since started on Trazodone he is able to fall but not able to stay asleep. Discussed other options we will try Belsomra.  Major Depression: he states symptoms have gotten bad over the past few months, they had a meeting at work with different standards and he stood up and discussed why it could not be done, since than his USS has been giving him a hard time. He is still getting along with his immediate supervisor and his direct co-worker, but is feeling threatened and paranoid about losing his job. He is trying to switch positions at work, he had an interview a couple of weeks ago but did not get an answer about the position yet.  His wife is very supportive. He is worrying all the time, no energy, lack of motivation when he gets home, not sleeping well, he still has episodes of crying spells. He is taking Wellbutrin, we will try Cymbalta, discussed possible side effects. He denies suicidal thoughts or ideation.    Patient Active Problem List   Diagnosis Date Noted  . Bronchitis with bronchospasm 01/20/2015  . Sprain of left wrist 11/30/2014  . GERD without esophagitis 11/10/2014  . Seasonal allergic rhinitis 11/10/2014  . History of nephrolithiasis 11/10/2014  . Obesity (BMI 30-39.9) 11/10/2014  . Tobacco abuse 11/10/2014  .  Snoring 11/10/2014  . Insomnia 11/10/2014    Past Surgical History  Procedure Laterality Date  . Facial reconstruction surgery  01/13/2008    After a car fell on him while working on it    Family History  Problem Relation Age of Onset  . Varicose Veins Mother   . Endometriosis Mother   . Drug abuse Father     Social History   Social History  . Marital Status: Married    Spouse Name: N/A  . Number of Children: N/A  . Years of Education: N/A   Occupational History  . Not on file.   Social History Main Topics  . Smoking status: Current Every Day Smoker -- 0.25 packs/day for 15 years    Types: Cigarettes     Start date: 11/10/1999  . Smokeless tobacco: Never Used  . Alcohol Use: No  . Drug Use: No  . Sexual Activity:    Partners: Female   Other Topics Concern  . Not on file   Social History Narrative     Current outpatient prescriptions:  .  EPINEPHrine (EPIPEN 2-PAK) 0.3 mg/0.3 mL IJ SOAJ injection, Inject 0.3 mLs (0.3 mg total) into the muscle once., Disp: 1 Device, Rfl: 1 .  omeprazole (PRILOSEC) 20 MG capsule, Take 1 capsule (20 mg total) by mouth daily., Disp: 90 capsule, Rfl: 0 .  DULoxetine (CYMBALTA) 30 MG capsule, Take 1 capsule (30 mg total) by mouth daily., Disp: 60 capsule, Rfl: 0 .  Suvorexant (BELSOMRA) 10 MG TABS, Take 1-2 tablets by mouth every evening., Disp: 60 tablet, Rfl: 0  Allergies  Allergen Reactions  . Bee Venom Anaphylaxis, Shortness Of Breath and Swelling  . Penicillins Hives    vasculitis     ROS  Constitutional: Negative for fever or weight change.  Respiratory: Negative for cough and shortness of breath.   Cardiovascular: Negative for chest pain or palpitations.  Gastrointestinal: Negative for abdominal pain, no bowel changes.  Musculoskeletal: Negative for gait problem or joint swelling.  Skin: Negative for rash.  Neurological: Negative for dizziness or headache.  No other specific complaints in a complete review of systems (except as listed in HPI above).  Objective  Filed Vitals:   02/19/15 0930  BP: 116/64  Pulse: 96  Temp: 98.1 F (36.7 C)  TempSrc: Oral  Resp: 18  Height: 6' (1.829 m)  Weight: 249 lb 11.2 oz (113.263 kg)  SpO2: 97%    Body mass index is 33.86 kg/(m^2).  Physical Exam  Constitutional: Patient appears well-developed and well-nourished. Obese  No distress.  HEENT: head atraumatic, normocephalic, pupils equal and reactive to light, neck supple, throat within normal limits Cardiovascular: Normal rate, regular rhythm and normal heart sounds.  No murmur heard. No BLE edema. Pulmonary/Chest: Effort normal and breath  sounds normal. No respiratory distress. Abdominal: Soft.  There is RUQ pain Psychiatric: Patient has a normal mood and affect. behavior is normal. Judgment and thought content normal.  PHQ2/9: Depression screen [facility] 2/9 01/20/2015 11/10/2014  Decreased Interest 0 0  Down, Depressed, Hopeless 0 0  PHQ - 2 Score 0 0     Fall Risk: Fall Risk  01/20/2015 11/10/2014  Falls in the past year? No No     Assessment & Plan   1. Insomnia  - Suvorexant (BELSOMRA) 10 MG TABS; Take 1-2 tablets by mouth every evening.  Dispense: 60 tablet; Refill: 0  2. Mild major depression (HCC)  We will change to Cymbalta, titrate slowly, return in a few  weeks - DULoxetine (CYMBALTA) 30 MG capsule; Take 1 capsule (30 mg total) by mouth daily.  Dispense: 60 capsule; Refill: 0  3. GERD without esophagitis  Continue medication   4. Nausea and vomiting, vomiting of unspecified type  It may be secondary to stress, only happening at work, however he has pain on RUQ during palpation , we will check [place]  - US Abdomen Limited RUQ; Future

## 2015-02-19 NOTE — Addendum Note (Signed)
Addended by: Alba Cory F on: 02/19/2015 10:02 AM   Modules accepted: Orders

## 2015-02-25 ENCOUNTER — Telehealth: Payer: Self-pay | Admitting: Family Medicine

## 2015-02-25 MED ORDER — ZOLPIDEM TARTRATE 10 MG PO TABS: 10.0000 mg | ORAL_TABLET | Freq: Every evening | ORAL | Status: DC | PRN

## 2015-02-25 NOTE — Telephone Encounter (Signed)
Okay, changed. Please call it in

## 2015-02-25 NOTE — Telephone Encounter (Signed)
Patient wife states he has never tried Ambien and would like to try it due to Insurance requesting he try it first. Please fax into his pharmacy and let him know once it is ready for pick up. Thanks.

## 2015-02-25 NOTE — Telephone Encounter (Signed)
Patient's wife was returning Tiffany's call.  Please call back.

## 2015-03-08 ENCOUNTER — Ambulatory Visit
Admission: RE | Admit: 2015-03-08 | Discharge: 2015-03-08 | Disposition: A | Discharge status: Home / Self Care | Payer: BLUE CROSS/BLUE SHIELD | Source: Ambulatory Visit | Attending: Family Medicine | Admitting: Family Medicine

## 2015-03-08 ENCOUNTER — Other Ambulatory Visit: Payer: Self-pay | Admitting: Family Medicine

## 2015-03-08 DIAGNOSIS — K760 Fatty (change of) liver, not elsewhere classified: Secondary | ICD-10-CM | POA: Insufficient documentation

## 2015-03-08 DIAGNOSIS — R112 Nausea with vomiting, unspecified: Secondary | ICD-10-CM | POA: Insufficient documentation

## 2015-03-09 ENCOUNTER — Telehealth: Payer: Self-pay | Admitting: Gastroenterology

## 2015-03-09 NOTE — Telephone Encounter (Signed)
I have called patient to make an appointment per referral to see GI for Hepatic steatosis. Patient was not available and I spoke with wife. She stated that patient would like to wait to make an appointment due to him feeling much better. I have advised her to please call our office if he wishes to see [place] in the future.

## 2015-03-12 ENCOUNTER — Ambulatory Visit: Payer: BLUE CROSS/BLUE SHIELD | Admitting: Family Medicine

## 2015-03-19 ENCOUNTER — Ambulatory Visit (INDEPENDENT_AMBULATORY_CARE_PROVIDER_SITE_OTHER): Payer: BLUE CROSS/BLUE SHIELD | Admitting: Family Medicine

## 2015-03-19 ENCOUNTER — Encounter: Payer: Self-pay | Admitting: Family Medicine

## 2015-03-19 VITALS — BP 116/68 | HR 142 | Temp 98.2°F | Resp 18 | Ht 72.0 in | Wt 242.4 lb

## 2015-03-19 DIAGNOSIS — F320 Major depressive disorder, single episode, mild: Secondary | ICD-10-CM

## 2015-03-19 DIAGNOSIS — R6883 Chills (without fever): Secondary | ICD-10-CM | POA: Diagnosis not present

## 2015-03-19 DIAGNOSIS — R000 Tachycardia, unspecified: Secondary | ICD-10-CM

## 2015-03-19 DIAGNOSIS — R634 Abnormal weight loss: Secondary | ICD-10-CM | POA: Diagnosis not present

## 2015-03-19 MED ORDER — ATENOLOL 25 MG PO TABS: 12.5000 mg | ORAL_TABLET | Freq: Two times a day (BID) | ORAL | Status: DC

## 2015-03-19 MED ORDER — DULOXETINE HCL 60 MG PO CPEP: 60.0000 mg | ORAL_CAPSULE | Freq: Every day | ORAL | Status: DC

## 2015-03-19 NOTE — Progress Notes (Signed)
Name: Timothy Fleming   MRN: [id number]    DOB: May 10, 1985   Date:03/19/2015       Progress Note  Subjective  Chief Complaint  Chief Complaint  Patient presents with  . Follow-up    3 week F/U   . Insomnia    Patient states the Ambien has helped his symptoms, takes pill and will be asleep within 35 minutes. Improving all around sleep, sleeps a total of 8 or 9 hours nightly.   . Depression    Improving symptoms and not having trouble with worrying or keeping foods down.   . Excessive Sweating    Patient is just started to noticed symptoms of excessive sweating all over his body but states his feet are staying freezing cold. Also patient is yawning alot more and having heart palpations/racing.   . Gastroesophageal Reflux    Well controlled with medication    HPI  Insomnia: doing well on Ambien, denies side effects , sleeping all night  Major Depression: he is taking Cymbalta - currently on 60 mg daily , he states he is feeling better emotionally, no crying spells, not worrying as much, helping with his performance at work also. No side effects of medication  Palpitation and sweating: he states that for the past 5 days he has noticed that his heart is racing, always sweaty, feet feels cold. On our scale he also lost 8 lbs since last visit. Normal appetite, no nausea or vomiting, no change in bowel movements. He has one episode of sharp sensation on left side of chest, lasted about 5 minutes and resolved by itself.   GERD: only has regurgitation when he over eats or eats a greasy food.   Patient Active Problem List   Diagnosis Date Noted  . Sprain of left wrist 11/30/2014  . GERD without esophagitis 11/10/2014  . Seasonal allergic rhinitis 11/10/2014  . History of nephrolithiasis 11/10/2014  . Obesity (BMI 30-39.9) 11/10/2014  . Tobacco abuse 11/10/2014  . Snoring 11/10/2014  . Insomnia 11/10/2014    Past Surgical History  Procedure Laterality Date  . Facial reconstruction  surgery  01/13/2008    After a car fell on him while working on it    Family History  Problem Relation Age of Onset  . Varicose Veins Mother   . Endometriosis Mother   . Drug abuse Father     Social History   Social History  . Marital Status: Married    Spouse Name: N/A  . Number of Children: N/A  . Years of Education: N/A   Occupational History  . Not on file.   Social History Main Topics  . Smoking status: Current Every Day Smoker -- 0.25 packs/day for 15 years    Types: Cigarettes    Start date: 11/10/1999  . Smokeless tobacco: Never Used  . Alcohol Use: No  . Drug Use: No  . Sexual Activity:    Partners: Female   Other Topics Concern  . Not on file   Social History Narrative     Current outpatient prescriptions:  .  DULoxetine (CYMBALTA) 60 MG capsule, Take 1 capsule (60 mg total) by mouth daily. First week after that 2 daily, Disp: 30 capsule, Rfl: 2 .  EPINEPHrine (EPIPEN 2-PAK) 0.3 mg/0.3 mL IJ SOAJ injection, Inject 0.3 mLs (0.3 mg total) into the muscle once., Disp: 1 Device, Rfl: 1 .  omeprazole (PRILOSEC) 20 MG capsule, Take 1 capsule (20 mg total) by mouth daily., Disp: 90 capsule, Rfl: 0 .  zolpidem (AMBIEN) 10 MG tablet, Take 1 tablet (10 mg total) by mouth at bedtime as needed for sleep., Disp: 30 tablet, Rfl: 0  Allergies  Allergen Reactions  . Bee Venom Anaphylaxis, Shortness Of Breath and Swelling  . Penicillins Hives    vasculitis     ROS  Ten systems reviewed and is negative except as mentioned in HPI   Objective  Filed Vitals:   03/19/15 1443  BP: 116/68  Pulse: 142  Temp: 98.2 F (36.8 C)  TempSrc: Oral  Resp: 18  Height: 6' (1.829 m)  Weight: 242 lb 6.4 oz (109.952 kg)  SpO2: 97%    Body mass index is 32.87 kg/(m^2).  Physical Exam  Constitutional: Patient appears well-developed and well-nourished. Obese No distress. Warm and dry skin  HEENT: head atraumatic, normocephalic, pupils equal and reactive to light, , neck  supple, throat within normal limits Cardiovascular: Increase  rate, regular rhythm and normal heart sounds.  No murmur heard. No BLE edema. Pulmonary/Chest: Effort normal and breath sounds normal. No respiratory distress. Abdominal: Soft.  There is no tenderness. Psychiatric: Patient has a normal mood and affect. behavior is normal. Judgment and thought content normal.  PHQ2/9: Depression screen [facility] 2/9 01/20/2015 11/10/2014  Decreased Interest 0 0  Down, Depressed, Hopeless 0 0  PHQ - 2 Score 0 0     Fall Risk: Fall Risk  01/20/2015 11/10/2014  Falls in the past year? No No    Assessment & Plan  1. Tachycardia  We will try Atenolol, half dose twice daily, advised to stay hydrated, and get up slowly, go to [facility] if symptoms gets worse or if chest pain for longer than 10 minutes.  - atenolol (TENORMIN) 25 MG tablet; Take 0.5 tablets (12.5 mg total) by mouth 2 (two) times daily.  Dispense: 30 tablet; Refill: 0 - EKG 12-Lead - Thyroid Panel With TSH - CBC with Differential/Platelet - Comprehensive metabolic panel  2. Weight loss  With tachycardia, check TSH, CBC  3. Chills  tachycardia  4. Mild major depression (HCC)  - DULoxetine (CYMBALTA) 60 MG capsule; Take 1 capsule (60 mg total) by mouth daily. First week after that 2 daily  Dispense: 30 capsule; Refill: 2

## 2015-03-19 NOTE — Patient Instructions (Signed)
Hyperthyroidism Hyperthyroidism is when the thyroid is too active (overactive). Your thyroid is a large gland that is located in your neck. The thyroid helps to control how your body uses food (metabolism). When your thyroid is overactive, it produces too much of a hormone called thyroxine.  CAUSES Causes of hyperthyroidism may include:  Graves disease. This is when your immune system attacks the thyroid gland. This is the most common cause.  Inflammation of the thyroid gland.  Tumor in the thyroid gland or somewhere else.  Excessive use of thyroid medicines, including:  Prescription thyroid supplement.  Herbal supplements that mimic thyroid hormones.  Solid or fluid-filled lumps within your thyroid gland (thyroid nodules).  Excessive ingestion of iodine. RISK FACTORS  Being male.  Having a family history of thyroid conditions. SIGNS AND SYMPTOMS Signs and symptoms of hyperthyroidism may include:  Nervousness.  Inability to tolerate heat.  Unexplained weight loss.  Diarrhea.  Change in the texture of hair or skin.  Heart skipping beats or making extra beats.  Rapid heart rate.  Loss of menstruation.  Shaky hands.  Fatigue.  Restlessness.  Increased appetite.  Sleep problems.  Enlarged thyroid gland or nodules. DIAGNOSIS  Diagnosis of hyperthyroidism may include:  Medical history and physical exam.  Blood tests.  Ultrasound tests. TREATMENT Treatment may include:  Medicines to control your thyroid.  Surgery to remove your thyroid.  Radiation therapy. HOME CARE INSTRUCTIONS   Take medicines only as directed by your health care provider.  Do not use any tobacco products, including cigarettes, chewing tobacco, or electronic cigarettes. If you need help quitting, ask your health care provider.  Do not exercise or do physical activity until your health care provider approves.  Keep all follow-up appointments as directed by your health care  provider. This is important. SEEK MEDICAL CARE IF:  Your symptoms do not get better with treatment.  You have fever.  You are taking thyroid replacement medicine and you:  Have depression.  Feel mentally and physically slow.  Have weight gain. SEEK IMMEDIATE MEDICAL CARE IF:   You have decreased alertness or a change in your awareness.  You have abdominal pain.  You feel dizzy.  You have a rapid heartbeat.  You have an irregular heartbeat.   This information is not intended to replace advice given to you by your health care provider. Make sure you discuss any questions you have with your health care provider.   Document Released: 01/09/2005 Document Revised: 01/30/2014 Document Reviewed: 05/27/2013 Elsevier Interactive Patient Education 2016 [employer]  

## 2015-03-20 LAB — CBC WITH DIFFERENTIAL/PLATELET
BASOS: 0 %
Basophils Absolute: 0 10*3/uL (ref 0.0–0.2)
EOS (ABSOLUTE): 0.2 10*3/uL (ref 0.0–0.4)
EOS: 1 %
HEMATOCRIT: 44.5 % (ref 37.5–51.0)
Hemoglobin: 15.9 g/dL (ref 12.6–17.7)
IMMATURE GRANS (ABS): 0.1 10*3/uL (ref 0.0–0.1)
IMMATURE GRANULOCYTES: 1 %
LYMPHS: 21 %
Lymphocytes Absolute: 3.2 10*3/uL — ABNORMAL HIGH (ref 0.7–3.1)
MCH: 31.1 pg (ref 26.6–33.0)
MCHC: 35.7 g/dL (ref 31.5–35.7)
MCV: 87 fL (ref 79–97)
MONOCYTES: 9 %
MONOS ABS: 1.5 10*3/uL — AB (ref 0.1–0.9)
NEUTROS PCT: 68 %
Neutrophils Absolute: 10.5 10*3/uL — ABNORMAL HIGH (ref 1.4–7.0)
Platelets: 418 10*3/uL — ABNORMAL HIGH (ref 150–379)
RBC: 5.11 x10E6/uL (ref 4.14–5.80)
RDW: 12.4 % (ref 12.3–15.4)
WBC: 15.4 10*3/uL — AB (ref 3.4–10.8)

## 2015-03-20 LAB — COMPREHENSIVE METABOLIC PANEL
ALBUMIN: 4.9 g/dL (ref 3.5–5.5)
ALK PHOS: 73 IU/L (ref 39–117)
ALT: 32 IU/L (ref 0–44)
AST: 23 IU/L (ref 0–40)
Albumin/Globulin Ratio: 1.8 (ref 1.1–2.5)
BILIRUBIN TOTAL: 0.5 mg/dL (ref 0.0–1.2)
BUN / CREAT RATIO: 8 (ref 8–19)
BUN: 9 mg/dL (ref 6–20)
CHLORIDE: 101 mmol/L (ref 96–106)
CO2: 22 mmol/L (ref 18–29)
CREATININE: 1.13 mg/dL (ref 0.76–1.27)
Calcium: 10 mg/dL (ref 8.7–10.2)
GFR calc non Af Amer: 87 mL/min/{1.73_m2} (ref >59)
GFR, EST AFRICAN AMERICAN: 100 mL/min/{1.73_m2} (ref >59)
GLOBULIN, TOTAL: 2.7 g/dL (ref 1.5–4.5)
GLUCOSE: 97 mg/dL (ref 65–99)
Potassium: 4.8 mmol/L (ref 3.5–5.2)
SODIUM: 144 mmol/L (ref 134–144)
Total Protein: 7.6 g/dL (ref 6.0–8.5)

## 2015-03-20 LAB — THYROID PANEL WITH TSH
Free Thyroxine Index: 2.3 (ref 1.2–4.9)
T3 Uptake Ratio: 27 % (ref 24–39)
T4, Total: 8.4 ug/dL (ref 4.5–12.0)
TSH: 1.49 u[IU]/mL (ref 0.450–4.500)

## 2015-03-21 ENCOUNTER — Other Ambulatory Visit: Payer: Self-pay | Admitting: Family Medicine

## 2015-03-21 DIAGNOSIS — R61 Generalized hyperhidrosis: Secondary | ICD-10-CM

## 2015-03-21 DIAGNOSIS — R634 Abnormal weight loss: Secondary | ICD-10-CM

## 2015-03-21 DIAGNOSIS — D72829 Elevated white blood cell count, unspecified: Secondary | ICD-10-CM

## 2015-03-23 ENCOUNTER — Ambulatory Visit
Admission: RE | Admit: 2015-03-23 | Discharge: 2015-03-23 | Disposition: A | Discharge status: Home / Self Care | Payer: BLUE CROSS/BLUE SHIELD | Source: Ambulatory Visit | Attending: Family Medicine | Admitting: Family Medicine

## 2015-03-23 ENCOUNTER — Other Ambulatory Visit: Payer: Self-pay | Admitting: Family Medicine

## 2015-03-23 DIAGNOSIS — R634 Abnormal weight loss: Secondary | ICD-10-CM | POA: Insufficient documentation

## 2015-03-23 DIAGNOSIS — D72829 Elevated white blood cell count, unspecified: Secondary | ICD-10-CM | POA: Diagnosis present

## 2015-03-24 LAB — CBC WITH DIFFERENTIAL/PLATELET
BASOS: 0 %
Basophils Absolute: 0 10*3/uL (ref 0.0–0.2)
EOS (ABSOLUTE): 0.2 10*3/uL (ref 0.0–0.4)
EOS: 2 %
HEMATOCRIT: 43.9 % (ref 37.5–51.0)
Hemoglobin: 14.9 g/dL (ref 12.6–17.7)
IMMATURE GRANS (ABS): 0.1 10*3/uL (ref 0.0–0.1)
IMMATURE GRANULOCYTES: 1 %
LYMPHS: 27 %
Lymphocytes Absolute: 2.5 10*3/uL (ref 0.7–3.1)
MCH: 30.1 pg (ref 26.6–33.0)
MCHC: 33.9 g/dL (ref 31.5–35.7)
MCV: 89 fL (ref 79–97)
Monocytes Absolute: 1 10*3/uL — ABNORMAL HIGH (ref 0.1–0.9)
Monocytes: 11 %
NEUTROS PCT: 59 %
Neutrophils Absolute: 5.5 10*3/uL (ref 1.4–7.0)
Platelets: 355 10*3/uL (ref 150–379)
RBC: 4.95 x10E6/uL (ref 4.14–5.80)
RDW: 12.8 % (ref 12.3–15.4)
WBC: 9.3 10*3/uL (ref 3.4–10.8)

## 2015-04-04 ENCOUNTER — Other Ambulatory Visit: Payer: Self-pay | Admitting: Family Medicine

## 2015-04-05 ENCOUNTER — Ambulatory Visit: Payer: BLUE CROSS/BLUE SHIELD | Admitting: Internal Medicine

## 2015-04-16 ENCOUNTER — Ambulatory Visit (INDEPENDENT_AMBULATORY_CARE_PROVIDER_SITE_OTHER): Payer: BLUE CROSS/BLUE SHIELD | Admitting: Family Medicine

## 2015-04-16 ENCOUNTER — Encounter: Payer: Self-pay | Admitting: Family Medicine

## 2015-04-16 VITALS — BP 118/74 | HR 100 | Temp 98.8°F | Resp 18 | Ht 72.0 in | Wt 247.7 lb

## 2015-04-16 DIAGNOSIS — F320 Major depressive disorder, single episode, mild: Secondary | ICD-10-CM

## 2015-04-16 DIAGNOSIS — R000 Tachycardia, unspecified: Secondary | ICD-10-CM | POA: Diagnosis not present

## 2015-04-16 DIAGNOSIS — F410 Panic disorder [episodic paroxysmal anxiety] without agoraphobia: Secondary | ICD-10-CM | POA: Diagnosis not present

## 2015-04-16 DIAGNOSIS — R072 Precordial pain: Secondary | ICD-10-CM

## 2015-04-16 MED ORDER — ALPRAZOLAM 0.5 MG PO TABS: 0.5000 mg | ORAL_TABLET | Freq: Every day | ORAL | Status: DC | PRN

## 2015-04-16 MED ORDER — ATENOLOL 25 MG PO TABS: 12.5000 mg | ORAL_TABLET | Freq: Two times a day (BID) | ORAL | Status: DC

## 2015-04-16 NOTE — Progress Notes (Signed)
Name: Timothy Fleming   MRN: [id number]    DOB: 06-Jun-1985   Date:04/16/2015       Progress Note  Subjective  Chief Complaint  Chief Complaint  Patient presents with  . Follow-up    patient was dx with having a panic attack about 2 weeks ago. was given xanax 0.5mg . has not had one since then.  . Medication Refill    needs Remus Loffler if it has not already been sent in.  Marland Kitchen Heart Problem    patient stated that since the panic attack, his heart feels like it was overworked. still feels funny.    HPI  Depression/GAD with panic attack: he was doing well Cymbalta, not worrying as much and able to be more productive but 10 days ago developed acute onset of SOB at work, co-worker called EMS and he was transported to [facility] and diagnosed with panic attack. Had EKG ( not available for review - care everywhere did not work ) but per patient it was normal. He left AMA, and went to local urgent care because of the long wait and was given alprazolam to take prn. He has only taken 4 pills in the past 10 days. Symptoms coincided with the lack of Ambien  ( because Belsomra was filled and he could not get the Ambien until today ) , he has not been able to sleep well even with otc Zyquil and if feeling more tired than usual. He is also concerned about a dull aching/soreness on left side of chest that has persisted since the first panic attack. No SOB, no calf tenderness, but he has a mild cough, that is dry from a tickle in the back of his throat.    Patient Active Problem List   Diagnosis Date Noted  . Sprain of left wrist 11/30/2014  . GERD without esophagitis 11/10/2014  . Seasonal allergic rhinitis 11/10/2014  . History of nephrolithiasis 11/10/2014  . Obesity (BMI 30-39.9) 11/10/2014  . Tobacco abuse 11/10/2014  . Snoring 11/10/2014  . Insomnia 11/10/2014    Past Surgical History  Procedure Laterality Date  . Facial reconstruction surgery  01/13/2008    After a car fell on him while working on it     Family History  Problem Relation Age of Onset  . Varicose Veins Mother   . Endometriosis Mother   . Drug abuse Father     Social History   Social History  . Marital Status: Married    Spouse Name: N/A  . Number of Children: N/A  . Years of Education: N/A   Occupational History  . Not on file.   Social History Main Topics  . Smoking status: Current Every Day Smoker -- 0.25 packs/day for 15 years    Types: Cigarettes    Start date: 11/10/1999  . Smokeless tobacco: Never Used  . Alcohol Use: No  . Drug Use: No  . Sexual Activity:    Partners: Female   Other Topics Concern  . Not on file   Social History Narrative     Current outpatient prescriptions:  .  ALPRAZolam (XANAX) 0.5 MG tablet, Take 1 tablet (0.5 mg total) by mouth daily as needed for anxiety., Disp: 30 tablet, Rfl: 0 .  atenolol (TENORMIN) 25 MG tablet, Take 0.5 tablets (12.5 mg total) by mouth 2 (two) times daily., Disp: 30 tablet, Rfl: 2 .  DULoxetine (CYMBALTA) 60 MG capsule, Take 1 capsule (60 mg total) by mouth daily. First week after that 2 daily, Disp:  30 capsule, Rfl: 2 .  EPINEPHrine (EPIPEN 2-PAK) 0.3 mg/0.3 mL IJ SOAJ injection, Inject 0.3 mLs (0.3 mg total) into the muscle once., Disp: 1 Device, Rfl: 1 .  omeprazole (PRILOSEC) 20 MG capsule, Take 1 capsule (20 mg total) by mouth daily., Disp: 90 capsule, Rfl: 0 .  zolpidem (AMBIEN) 10 MG tablet, take 1 tablet by mouth at bedtime if needed for sleep, Disp: 30 tablet, Rfl: 0  Allergies  Allergen Reactions  . Bee Venom Anaphylaxis, Shortness Of Breath and Swelling  . Penicillins Hives    vasculitis     ROS  Ten systems reviewed and is negative except as mentioned in HPI   Objective  Filed Vitals:   04/16/15 1455  BP: 118/74  Pulse: 100  Temp: 98.8 F (37.1 C)  TempSrc: Oral  Resp: 18  Height: 6' (1.829 m)  Weight: 247 lb 11.2 oz (112.356 kg)  SpO2: 98%    Body mass index is 33.59 kg/(m^2).  Physical Exam  Constitutional:  Patient appears well-developed and well-nourished. Obese  No distress.  HEENT: head atraumatic, normocephalic, pupils equal and reactive to light, neck supple, throat within normal limits Cardiovascular: Normal rate, regular rhythm and normal heart sounds.  No murmur heard. No BLE edema. Pulmonary/Chest: Effort normal and breath sounds normal. No respiratory distress. Abdominal: Soft.  There is no tenderness. Psychiatric: Patient has a normal mood and affect. behavior is normal. Judgment and thought content normal.  Recent Results (from the past 2160 hour(s))  Thyroid Panel With TSH     Status: None   Collection Time: 03/19/15  3:21 PM  Result Value Ref Range   TSH 1.490 0.450 - 4.500 uIU/mL   T4, Total 8.4 4.5 - 12.0 ug/dL   T3 Uptake Ratio 27 24 - 39 %   Free Thyroxine Index 2.3 1.2 - 4.9  CBC with Differential/Platelet     Status: Abnormal   Collection Time: 03/19/15  3:21 PM  Result Value Ref Range   WBC 15.4 (H) 3.4 - 10.8 x10E3/uL   RBC 5.11 4.14 - 5.80 x10E6/uL   Hemoglobin 15.9 12.6 - 17.7 g/dL   Hematocrit 16.1 09.6 - 51.0 %   MCV 87 79 - 97 fL   MCH 31.1 26.6 - 33.0 pg   MCHC 35.7 31.5 - 35.7 g/dL   RDW 04.5 40.9 - 81.1 %   Platelets 418 (H) 150 - 379 x10E3/uL   Neutrophils 68 %   Lymphs 21 %   Monocytes 9 %   Eos 1 %   Basos 0 %   Neutrophils Absolute 10.5 (H) 1.4 - 7.0 x10E3/uL   Lymphocytes Absolute 3.2 (H) 0.7 - 3.1 x10E3/uL   Monocytes Absolute 1.5 (H) 0.1 - 0.9 x10E3/uL   EOS (ABSOLUTE) 0.2 0.0 - 0.4 x10E3/uL   Basophils Absolute 0.0 0.0 - 0.2 x10E3/uL   Immature Granulocytes 1 %   Immature Grans (Abs) 0.1 0.0 - 0.1 x10E3/uL  Comprehensive metabolic panel     Status: None   Collection Time: 03/19/15  3:21 PM  Result Value Ref Range   Glucose 97 65 - 99 mg/dL   BUN 9 6 - 20 mg/dL   Creatinine, Ser 9.14 0.76 - 1.27 mg/dL   GFR calc non Af Amer 87 >59 mL/min/1.73   GFR calc Af Amer 100 >59 mL/min/1.73   BUN/Creatinine Ratio 8 8 - 19   Sodium 144 134 - 144  mmol/L   Potassium 4.8 3.5 - 5.2 mmol/L   Chloride 101 96 -  106 mmol/L   CO2 22 18 - 29 mmol/L   Calcium 10.0 8.7 - 10.2 mg/dL   Total Protein 7.6 6.0 - 8.5 g/dL   Albumin 4.9 3.5 - 5.5 g/dL   Globulin, Total 2.7 1.5 - 4.5 g/dL   Albumin/Globulin Ratio 1.8 1.1 - 2.5    Comment: **Effective April 05, 2015 the reference interval**   for A/G Ratio will be changing to:              Age                Male          Male           0 -  7 days       1.1 - 2.3       1.1 - 2.3           8 - 30 days       1.2 - 2.8       1.2 - 2.8           1 -  6 months     1.3 - 3.6       1.3 - 3.6    7 months -  5 years      1.5 - 2.6       1.5 - 2.6              > 5 years      1.2 - 2.2       1.2 - 2.2    Bilirubin Total 0.5 0.0 - 1.2 mg/dL   Alkaline Phosphatase 73 39 - 117 IU/L   AST 23 0 - 40 IU/L   ALT 32 0 - 44 IU/L  CBC with Differential/Platelet     Status: Abnormal   Collection Time: 03/23/15  8:20 AM  Result Value Ref Range   WBC 9.3 3.4 - 10.8 x10E3/uL   RBC 4.95 4.14 - 5.80 x10E6/uL   Hemoglobin 14.9 12.6 - 17.7 g/dL   Hematocrit 16.1 09.6 - 51.0 %   MCV 89 79 - 97 fL   MCH 30.1 26.6 - 33.0 pg   MCHC 33.9 31.5 - 35.7 g/dL   RDW 04.5 40.9 - 81.1 %   Platelets 355 150 - 379 x10E3/uL   Neutrophils 59 %   Lymphs 27 %   Monocytes 11 %   Eos 2 %   Basos 0 %   Neutrophils Absolute 5.5 1.4 - 7.0 x10E3/uL   Lymphocytes Absolute 2.5 0.7 - 3.1 x10E3/uL   Monocytes Absolute 1.0 (H) 0.1 - 0.9 x10E3/uL   EOS (ABSOLUTE) 0.2 0.0 - 0.4 x10E3/uL   Basophils Absolute 0.0 0.0 - 0.2 x10E3/uL   Immature Granulocytes 1 %   Immature Grans (Abs) 0.1 0.0 - 0.1 x10E3/uL     PHQ2/9: Depression screen [facility] 2/9 04/16/2015 01/20/2015 11/10/2014  Decreased Interest 0 0 0  Down, Depressed, Hopeless 1 0 0  PHQ - 2 Score 1 0 0    Fall Risk: Fall Risk  04/16/2015 01/20/2015 11/10/2014  Falls in the past year? No No No    Functional Status Survey: Is the patient deaf or have difficulty hearing?: No Does  the patient have difficulty seeing, even when wearing glasses/contacts?: No Does the patient have difficulty concentrating, remembering, or making decisions?: No Does the patient have difficulty walking or climbing stairs?: No Does the patient have difficulty dressing or bathing?: No Does the patient have difficulty doing  errands alone such as visiting a doctor's office or shopping?: No   Assessment & Plan  1. Mild major depression (HCC)  Continue Duloxetine  2. Panic attack  Explained he can only take it for panic attacks, must last at least 3 months - ALPRAZolam (XANAX) 0.5 MG tablet; Take 1 tablet (0.5 mg total) by mouth daily as needed for anxiety.  Dispense: 30 tablet; Refill: 0  3. Tachycardia  Improved with Atenolol  - atenolol (TENORMIN) 25 MG tablet; Take 0.5 tablets (12.5 mg total) by mouth 2 (two) times daily.  Dispense: 30 tablet; Refill: 2  4. Precordial pain  - EKG 12-Lead

## 2015-05-07 ENCOUNTER — Other Ambulatory Visit: Payer: Self-pay | Admitting: Family Medicine

## 2015-05-07 NOTE — Telephone Encounter (Signed)
Patient requesting refill. 

## 2015-05-11 ENCOUNTER — Other Ambulatory Visit: Payer: Self-pay | Admitting: Family Medicine

## 2015-05-12 NOTE — Telephone Encounter (Signed)
Patient requesting refill. 

## 2015-05-20 ENCOUNTER — Ambulatory Visit (INDEPENDENT_AMBULATORY_CARE_PROVIDER_SITE_OTHER): Payer: BLUE CROSS/BLUE SHIELD | Admitting: Family Medicine

## 2015-05-20 ENCOUNTER — Encounter: Payer: Self-pay | Admitting: Family Medicine

## 2015-05-20 VITALS — BP 116/72 | HR 101 | Temp 98.8°F | Resp 18 | Ht 72.0 in | Wt 248.6 lb

## 2015-05-20 DIAGNOSIS — R21 Rash and other nonspecific skin eruption: Secondary | ICD-10-CM | POA: Diagnosis not present

## 2015-05-20 DIAGNOSIS — F320 Major depressive disorder, single episode, mild: Secondary | ICD-10-CM | POA: Diagnosis not present

## 2015-05-20 DIAGNOSIS — G4700 Insomnia, unspecified: Secondary | ICD-10-CM

## 2015-05-20 DIAGNOSIS — Z872 Personal history of diseases of the skin and subcutaneous tissue: Secondary | ICD-10-CM

## 2015-05-20 DIAGNOSIS — R000 Tachycardia, unspecified: Secondary | ICD-10-CM | POA: Diagnosis not present

## 2015-05-20 DIAGNOSIS — F410 Panic disorder [episodic paroxysmal anxiety] without agoraphobia: Secondary | ICD-10-CM | POA: Diagnosis not present

## 2015-05-20 MED ORDER — ATENOLOL 25 MG PO TABS: 12.5000 mg | ORAL_TABLET | Freq: Two times a day (BID) | ORAL | Status: DC

## 2015-05-20 MED ORDER — PREDNISONE 10 MG (48) PO TBPK: ORAL_TABLET | Freq: Every day | ORAL | Status: DC

## 2015-05-20 MED ORDER — ZOLPIDEM TARTRATE 10 MG PO TABS: ORAL_TABLET | ORAL | Status: DC

## 2015-05-20 MED ORDER — DULOXETINE HCL 60 MG PO CPEP: ORAL_CAPSULE | ORAL | Status: DC

## 2015-05-20 MED ORDER — ALPRAZOLAM 0.5 MG PO TABS: 0.5000 mg | ORAL_TABLET | Freq: Every day | ORAL | Status: DC | PRN

## 2015-05-20 NOTE — Progress Notes (Signed)
Name: Timothy Fleming   MRN: [id number]    DOB: Jun 12, 1985   Date:05/20/2015       Progress Note  Subjective  Chief Complaint  Chief Complaint  Patient presents with  . Depression    mild major. patient did not receive enough quantity for him to take BID  . Tachycardia  . precordial pain  . Panic Attack  . Rash    patient has noticed a rash bilateral (predominantly on left) feet. some itching, but no burning. hx of vasculitis    HPI  Depression/GAD with panic attack: he has been doing well on Cymbalta, not worrying as much and able to be more productive . He is still under a lot of stress at work, but able to function. He has noticed that he has been taking Alprazolam almost daily for anxiety, advised to make 45 pills last 3 months because of potential of addiction  Insomnia: doing well on Ambien , able to fall and stay asleep  Tachycardia: improved with Atenolol, takes when he gets at work because it makes him a little sleepy when he first takes it  Rash: he had a severe episode of vasculitis in 2014 and had to be hospitalized in 2014. He has noticed a rash on dorsal aspect of his left foot this am, mild pruritus this am, not progressing like it did in the past. No fatigue or fever associated with it. No blood in stools or urine.   Patient Active Problem List   Diagnosis Date Noted  . History of vasculitis of skin 05/20/2015  . Sprain of left wrist 11/30/2014  . GERD without esophagitis 11/10/2014  . Seasonal allergic rhinitis 11/10/2014  . History of nephrolithiasis 11/10/2014  . Obesity (BMI 30-39.9) 11/10/2014  . Tobacco abuse 11/10/2014  . Snoring 11/10/2014  . Insomnia 11/10/2014    Past Surgical History  Procedure Laterality Date  . Facial reconstruction surgery  01/13/2008    After a car fell on him while working on it    Family History  Problem Relation Age of Onset  . Varicose Veins Mother   . Endometriosis Mother   . Drug abuse Father     Social History    Social History  . Marital Status: Married    Spouse Name: N/A  . Number of Children: N/A  . Years of Education: N/A   Occupational History  . Not on file.   Social History Main Topics  . Smoking status: Current Every Day Smoker -- 0.25 packs/day for 15 years    Types: Cigarettes    Start date: 11/10/1999  . Smokeless tobacco: Never Used  . Alcohol Use: No  . Drug Use: No  . Sexual Activity:    Partners: Female   Other Topics Concern  . Not on file   Social History Narrative     Current outpatient prescriptions:  .  ALPRAZolam (XANAX) 0.5 MG tablet, Take 1 tablet (0.5 mg total) by mouth daily as needed for anxiety., Disp: 30 tablet, Rfl: 0 .  atenolol (TENORMIN) 25 MG tablet, Take 0.5 tablets (12.5 mg total) by mouth 2 (two) times daily., Disp: 90 tablet, Rfl: 0 .  DULoxetine (CYMBALTA) 60 MG capsule, Daily, Disp: 90 capsule, Rfl: 0 .  omeprazole (PRILOSEC) 20 MG capsule, Take 1 capsule (20 mg total) by mouth daily., Disp: 90 capsule, Rfl: 0 .  zolpidem (AMBIEN) 10 MG tablet, take 1 tablet by mouth at bedtime if needed for sleep, Disp: 90 tablet, Rfl: 0 .  EPINEPHrine (EPIPEN 2-PAK) 0.3 mg/0.3 mL IJ SOAJ injection, Inject 0.3 mLs (0.3 mg total) into the muscle once. (Patient not taking: Reported on 05/20/2015), Disp: 1 Device, Rfl: 1 .  predniSONE (STERAPRED UNI-PAK 48 TAB) 10 MG (48) TBPK tablet, Take by mouth daily., Disp: 48 tablet, Rfl: 0  Allergies  Allergen Reactions  . Bee Venom Anaphylaxis, Shortness Of Breath and Swelling  . Penicillins Hives    vasculitis     ROS  Ten systems reviewed and is negative except as mentioned in HPI  Chest pain resolved  Objective  Filed Vitals:   05/20/15 1131  BP: 116/72  Pulse: 101  Temp: 98.8 F (37.1 C)  TempSrc: Oral  Resp: 18  Height: 6' (1.829 m)  Weight: 248 lb 9.6 oz (112.764 kg)  SpO2: 97%    Body mass index is 33.71 kg/(m^2).  Physical Exam   Constitutional: Patient appears well-developed and  well-nourished. Obese No distress.  HEENT: head atraumatic, normocephalic, pupils equal and reactive to light,, neck supple, throat within normal limits Cardiovascular: Normal rate, regular rhythm and normal heart sounds.  No murmur heard. No BLE edema. Pulmonary/Chest: Effort normal and breath sounds normal. No respiratory distress. Abdominal: Soft.  There is no tenderness. Psychiatric: Patient has a normal mood and affect. behavior is normal. Judgment and thought content normal. Skin: petechiae on left dorsal foot, it does not blanch.   Recent Results (from the past 2160 hour(s))  Thyroid Panel With TSH     Status: None   Collection Time: 03/19/15  3:21 PM  Result Value Ref Range   TSH 1.490 0.450 - 4.500 uIU/mL   T4, Total 8.4 4.5 - 12.0 ug/dL   T3 Uptake Ratio 27 24 - 39 %   Free Thyroxine Index 2.3 1.2 - 4.9  CBC with Differential/Platelet     Status: Abnormal   Collection Time: 03/19/15  3:21 PM  Result Value Ref Range   WBC 15.4 (H) 3.4 - 10.8 x10E3/uL   RBC 5.11 4.14 - 5.80 x10E6/uL   Hemoglobin 15.9 12.6 - 17.7 g/dL   Hematocrit 60.4 54.0 - 51.0 %   MCV 87 79 - 97 fL   MCH 31.1 26.6 - 33.0 pg   MCHC 35.7 31.5 - 35.7 g/dL   RDW 98.1 19.1 - 47.8 %   Platelets 418 (H) 150 - 379 x10E3/uL   Neutrophils 68 %   Lymphs 21 %   Monocytes 9 %   Eos 1 %   Basos 0 %   Neutrophils Absolute 10.5 (H) 1.4 - 7.0 x10E3/uL   Lymphocytes Absolute 3.2 (H) 0.7 - 3.1 x10E3/uL   Monocytes Absolute 1.5 (H) 0.1 - 0.9 x10E3/uL   EOS (ABSOLUTE) 0.2 0.0 - 0.4 x10E3/uL   Basophils Absolute 0.0 0.0 - 0.2 x10E3/uL   Immature Granulocytes 1 %   Immature Grans (Abs) 0.1 0.0 - 0.1 x10E3/uL  Comprehensive metabolic panel     Status: None   Collection Time: 03/19/15  3:21 PM  Result Value Ref Range   Glucose 97 65 - 99 mg/dL   BUN 9 6 - 20 mg/dL   Creatinine, Ser 2.95 0.76 - 1.27 mg/dL   GFR calc non Af Amer 87 >59 mL/min/1.73   GFR calc Af Amer 100 >59 mL/min/1.73   BUN/Creatinine Ratio 8 8 - 19    Sodium 144 134 - 144 mmol/L   Potassium 4.8 3.5 - 5.2 mmol/L   Chloride 101 96 - 106 mmol/L   CO2 22 18 - 29  mmol/L   Calcium 10.0 8.7 - 10.2 mg/dL   Total Protein 7.6 6.0 - 8.5 g/dL   Albumin 4.9 3.5 - 5.5 g/dL   Globulin, Total 2.7 1.5 - 4.5 g/dL   Albumin/Globulin Ratio 1.8 1.1 - 2.5    Comment: **Effective April 05, 2015 the reference interval**   for A/G Ratio will be changing to:              Age                Male          Male           0 -  7 days       1.1 - 2.3       1.1 - 2.3           8 - 30 days       1.2 - 2.8       1.2 - 2.8           1 -  6 months     1.3 - 3.6       1.3 - 3.6    7 months -  5 years      1.5 - 2.6       1.5 - 2.6              > 5 years      1.2 - 2.2       1.2 - 2.2    Bilirubin Total 0.5 0.0 - 1.2 mg/dL   Alkaline Phosphatase 73 39 - 117 IU/L   AST 23 0 - 40 IU/L   ALT 32 0 - 44 IU/L  CBC with Differential/Platelet     Status: Abnormal   Collection Time: 03/23/15  8:20 AM  Result Value Ref Range   WBC 9.3 3.4 - 10.8 x10E3/uL   RBC 4.95 4.14 - 5.80 x10E6/uL   Hemoglobin 14.9 12.6 - 17.7 g/dL   Hematocrit 16.143.9 09.637.5 - 51.0 %   MCV 89 79 - 97 fL   MCH 30.1 26.6 - 33.0 pg   MCHC 33.9 31.5 - 35.7 g/dL   RDW 04.512.8 40.912.3 - 81.115.4 %   Platelets 355 150 - 379 x10E3/uL   Neutrophils 59 %   Lymphs 27 %   Monocytes 11 %   Eos 2 %   Basos 0 %   Neutrophils Absolute 5.5 1.4 - 7.0 x10E3/uL   Lymphocytes Absolute 2.5 0.7 - 3.1 x10E3/uL   Monocytes Absolute 1.0 (H) 0.1 - 0.9 x10E3/uL   EOS (ABSOLUTE) 0.2 0.0 - 0.4 x10E3/uL   Basophils Absolute 0.0 0.0 - 0.2 x10E3/uL   Immature Granulocytes 1 %   Immature Grans (Abs) 0.1 0.0 - 0.1 x10E3/uL      PHQ2/9: [facility] 2/9 05/20/2015 04/16/2015 01/20/2015 11/10/2014  Decreased Interest 0 0 0 0  Down, Depressed, Hopeless 1 1 0 0  PHQ - 2 Score 1 1 0 0     Fall Risk: Fall Risk  05/20/2015 04/16/2015 01/20/2015 11/10/2014  Falls in the past year? No No No No      Functional Status  Survey: Is the patient deaf or have difficulty hearing?: No Does the patient have difficulty seeing, even when wearing glasses/contacts?: No Does the patient have difficulty concentrating, remembering, or making decisions?: No Does the patient have difficulty walking or climbing stairs?: No Does the patient have difficulty dressing or bathing?: No Does the patient have difficulty  doing errands alone such as visiting a doctor's office or shopping?: No    Assessment & Plan  1. Mild major depression (HCC)  Doing better, still stressed at work  - DULoxetine (CYMBALTA) 60 MG capsule; Daily  Dispense: 90 capsule; Refill: 0  2. History of vasculitis of skin  - predniSONE (STERAPRED UNI-PAK 48 TAB) 10 MG (48) TBPK tablet; Take by mouth daily.  Dispense: 48 tablet; Refill: 0  3. Rash  Because of history of vasculitis we will start prednisone taper right away, get records from [facility] - admitted in 2014 - predniSONE (STERAPRED UNI-PAK 48 TAB) 10 MG (48) TBPK tablet; Take by mouth daily.  Dispense: 48 tablet; Refill: 0  4. Tachycardia  - atenolol (TENORMIN) 25 MG tablet; Take 0.5 tablets (12.5 mg total) by mouth 2 (two) times daily.  Dispense: 90 tablet; Refill: 0  5. Insomnia  - zolpidem (AMBIEN) 10 MG tablet; take 1 tablet by mouth at bedtime if needed for sleep  Dispense: 90 tablet; Refill: 0

## 2015-07-08 ENCOUNTER — Encounter: Payer: Self-pay | Admitting: Family Medicine

## 2015-07-08 ENCOUNTER — Ambulatory Visit (INDEPENDENT_AMBULATORY_CARE_PROVIDER_SITE_OTHER): Payer: BLUE CROSS/BLUE SHIELD | Admitting: Family Medicine

## 2015-07-08 VITALS — BP 122/64 | HR 78 | Temp 98.3°F | Resp 18 | Wt 252.9 lb

## 2015-07-08 DIAGNOSIS — M6248 Contracture of muscle, other site: Secondary | ICD-10-CM

## 2015-07-08 DIAGNOSIS — M62838 Other muscle spasm: Secondary | ICD-10-CM

## 2015-07-08 DIAGNOSIS — M62830 Muscle spasm of back: Secondary | ICD-10-CM

## 2015-07-08 DIAGNOSIS — F320 Major depressive disorder, single episode, mild: Secondary | ICD-10-CM | POA: Diagnosis not present

## 2015-07-08 MED ORDER — METAXALONE 800 MG PO TABS: 800.0000 mg | ORAL_TABLET | Freq: Three times a day (TID) | ORAL | Status: DC

## 2015-07-08 MED ORDER — NAPROXEN 500 MG PO TABS: 500.0000 mg | ORAL_TABLET | Freq: Two times a day (BID) | ORAL | Status: DC

## 2015-07-08 NOTE — Progress Notes (Signed)
Name: Timothy Fleming   MRN: [id number]    DOB: 04/03/1985   Date:07/08/2015       Progress Note  Subjective  Chief Complaint  Chief Complaint  Patient presents with  . Shoulder Injury    patient presents today with an shoulder/back injury about 2 weeks ago. he is not sure how it happened. patient has tried otc Aleve but it has not helped.   . Back Pain    patient stated that the pain is between his shoulder blades then goes to arm.    HPI  Neck and Back Spasm: he states that 2 weeks ago he developed pain on left side of neck and upper back. Happened suddenly and not sure if triggered by activity at work or at home. He has been taking otc medication without any help. Pain is described as dull constant pain with episodes of acute sharp pain. Still going to work but picking up debris makes symptoms worse. No weakness or tingling on back of left hand.   Depression: feeling more stressed, got wrote up at work because some sewage backed up in a ladies house, and he is more stressed at work. He is still taking medication as prescribed. Denies suicidal thoughts or ideation  Patient Active Problem List   Diagnosis Date Noted  . History of vasculitis of skin 05/20/2015  . Sprain of left wrist 11/30/2014  . GERD without esophagitis 11/10/2014  . Seasonal allergic rhinitis 11/10/2014  . History of nephrolithiasis 11/10/2014  . Obesity (BMI 30-39.9) 11/10/2014  . Tobacco abuse 11/10/2014  . Snoring 11/10/2014  . Insomnia 11/10/2014    Past Surgical History  Procedure Laterality Date  . Facial reconstruction surgery  01/13/2008    After a car fell on him while working on it    Family History  Problem Relation Age of Onset  . Varicose Veins Mother   . Endometriosis Mother   . Drug abuse Father     Social History   Social History  . Marital Status: Married    Spouse Name: N/A  . Number of Children: N/A  . Years of Education: N/A   Occupational History  . Not on file.   Social  History Main Topics  . Smoking status: Current Every Day Smoker -- 0.25 packs/day for 15 years    Types: Cigarettes    Start date: 11/10/1999  . Smokeless tobacco: Never Used  . Alcohol Use: No  . Drug Use: No  . Sexual Activity:    Partners: Female   Other Topics Concern  . Not on file   Social History Narrative     Current outpatient prescriptions:  .  ALPRAZolam (XANAX) 0.5 MG tablet, Take 1 tablet (0.5 mg total) by mouth daily as needed for anxiety., Disp: 45 tablet, Rfl: 0 .  atenolol (TENORMIN) 25 MG tablet, Take 0.5 tablets (12.5 mg total) by mouth 2 (two) times daily., Disp: 90 tablet, Rfl: 0 .  DULoxetine (CYMBALTA) 60 MG capsule, Daily, Disp: 90 capsule, Rfl: 0 .  EPINEPHrine (EPIPEN 2-PAK) 0.3 mg/0.3 mL IJ SOAJ injection, Inject 0.3 mLs (0.3 mg total) into the muscle once. (Patient not taking: Reported on 05/20/2015), Disp: 1 Device, Rfl: 1 .  metaxalone (SKELAXIN) 800 MG tablet, Take 1 tablet (800 mg total) by mouth 3 (three) times daily., Disp: 90 tablet, Rfl: 0 .  naproxen (NAPROSYN) 500 MG tablet, Take 1 tablet (500 mg total) by mouth 2 (two) times daily with a meal., Disp: 30 tablet, Rfl: 0 .  omeprazole (PRILOSEC) 20 MG capsule, Take 1 capsule (20 mg total) by mouth daily., Disp: 90 capsule, Rfl: 0 .  zolpidem (AMBIEN) 10 MG tablet, take 1 tablet by mouth at bedtime if needed for sleep, Disp: 90 tablet, Rfl: 0  Allergies  Allergen Reactions  . Bee Venom Anaphylaxis, Shortness Of Breath and Swelling  . Penicillins Hives    vasculitis     ROS  Ten systems reviewed and is negative except as mentioned in HPI   Objective  Filed Vitals:   07/08/15 1132  BP: 122/64  Pulse: 78  Temp: 98.3 F (36.8 C)  TempSrc: Oral  Resp: 18  Weight: 252 lb 14.4 oz (114.715 kg)  SpO2: 97%    Body mass index is 34.29 kg/(m^2).  Physical Exam  Constitutional: Patient appears well-developed and well-nourished. Obese  No distress.  HEENT: head atraumatic, normocephalic,  pupils equal and reactive to light,  neck supple, throat within normal limits Cardiovascular: Normal rate, regular rhythm and normal heart sounds.  No murmur heard. No BLE edema. Pulmonary/Chest: Effort normal and breath sounds normal. No respiratory distress. Abdominal: Soft.  There is no tenderness. Psychiatric: Patient has a normal mood and affect. behavior is normal. Judgment and thought content normal. Muscular Skeletal: spasms and pain during palpation of left trapezium muscle from nuchal area to mid back and left shoulder, no rashes, normal rom of shoulder but pain with rom of neck  PHQ2/9: Depression screen [facility] 2/9 07/08/2015 05/20/2015 04/16/2015 01/20/2015 11/10/2014  Decreased Interest 0 0 0 0 0  Down, Depressed, Hopeless 0 1 1 0 0  PHQ - 2 Score 0 1 1 0 0     Fall Risk: Fall Risk  07/08/2015 05/20/2015 04/16/2015 01/20/2015 11/10/2014  Falls in the past year? No No No No No     Functional Status Survey: Is the patient deaf or have difficulty hearing?: No Does the patient have difficulty seeing, even when wearing glasses/contacts?: No Does the patient have difficulty concentrating, remembering, or making decisions?: No Does the patient have difficulty walking or climbing stairs?: No Does the patient have difficulty dressing or bathing?: No Does the patient have difficulty doing errands alone such as visiting a doctor's office or shopping?: No    Assessment & Plan  1. Muscle spasms of neck  - metaxalone (SKELAXIN) 800 MG tablet; Take 1 tablet (800 mg total) by mouth 3 (three) times daily.  Dispense: 90 tablet; Refill: 0 - naproxen (NAPROSYN) 500 MG tablet; Take 1 tablet (500 mg total) by mouth 2 (two) times daily with a meal.  Dispense: 30 tablet; Refill: 0 - Ambulatory referral to Chiropractic Time off work until next Tuesday so he can get manipulation prior to return to work, also explained that Skelaxin may cause drowsiness.   2. Muscle spasm of back  - metaxalone  (SKELAXIN) 800 MG tablet; Take 1 tablet (800 mg total) by mouth 3 (three) times daily.  Dispense: 90 tablet; Refill: 0 - naproxen (NAPROSYN) 500 MG tablet; Take 1 tablet (500 mg total) by mouth 2 (two) times daily with a meal.  Dispense: 30 tablet; Refill: 0 - Ambulatory referral to Chiropractic  3. Mild major depression (HCC)  Continue medication

## 2015-08-17 ENCOUNTER — Other Ambulatory Visit: Payer: Self-pay | Admitting: Family Medicine

## 2015-08-17 DIAGNOSIS — F320 Major depressive disorder, single episode, mild: Secondary | ICD-10-CM

## 2015-08-17 DIAGNOSIS — F410 Panic disorder [episodic paroxysmal anxiety] without agoraphobia: Secondary | ICD-10-CM

## 2015-08-18 ENCOUNTER — Ambulatory Visit: Payer: BLUE CROSS/BLUE SHIELD | Admitting: Family Medicine

## 2015-08-18 NOTE — Telephone Encounter (Signed)
Patient requesting refill. 

## 2015-08-20 NOTE — Telephone Encounter (Signed)
Spoke with wife and she scheduled appt for husband for August.

## 2015-09-03 ENCOUNTER — Ambulatory Visit (INDEPENDENT_AMBULATORY_CARE_PROVIDER_SITE_OTHER): Payer: BLUE CROSS/BLUE SHIELD | Admitting: Family Medicine

## 2015-09-03 ENCOUNTER — Encounter: Payer: Self-pay | Admitting: Family Medicine

## 2015-09-03 VITALS — BP 120/68 | HR 99 | Temp 98.5°F | Resp 18 | Ht 72.0 in | Wt 254.7 lb

## 2015-09-03 DIAGNOSIS — M62830 Muscle spasm of back: Secondary | ICD-10-CM | POA: Diagnosis not present

## 2015-09-03 DIAGNOSIS — F410 Panic disorder [episodic paroxysmal anxiety] without agoraphobia: Secondary | ICD-10-CM | POA: Diagnosis not present

## 2015-09-03 DIAGNOSIS — F320 Major depressive disorder, single episode, mild: Secondary | ICD-10-CM

## 2015-09-03 DIAGNOSIS — G4700 Insomnia, unspecified: Secondary | ICD-10-CM

## 2015-09-03 DIAGNOSIS — F321 Major depressive disorder, single episode, moderate: Secondary | ICD-10-CM

## 2015-09-03 DIAGNOSIS — M6248 Contracture of muscle, other site: Secondary | ICD-10-CM | POA: Diagnosis not present

## 2015-09-03 DIAGNOSIS — K219 Gastro-esophageal reflux disease without esophagitis: Secondary | ICD-10-CM

## 2015-09-03 DIAGNOSIS — M62838 Other muscle spasm: Secondary | ICD-10-CM

## 2015-09-03 MED ORDER — DULOXETINE HCL 30 MG PO CPEP: 90.0000 mg | ORAL_CAPSULE | Freq: Every day | ORAL | 0 refills | Status: DC

## 2015-09-03 MED ORDER — ALPRAZOLAM 0.5 MG PO TABS: 0.5000 mg | ORAL_TABLET | Freq: Every day | ORAL | 0 refills | Status: DC | PRN

## 2015-09-03 MED ORDER — ZOLPIDEM TARTRATE 10 MG PO TABS: ORAL_TABLET | ORAL | 0 refills | Status: DC

## 2015-09-03 NOTE — Progress Notes (Signed)
Name: Timothy SnookCasey L Brenes   MRN: [id number]    DOB: 07/02/1985   Date:09/03/2015       Progress Note  Subjective  Chief Complaint  Chief Complaint  Patient presents with  . Medication Refill  . Depression    Patient states it is worst, patient wife still has not found a job and he is worrying about small things. Patient feels like he needs a increase in his medications and states he was denied a transfer at work due to his current position needing him, but him and his boss do not get along.   . Insomnia    Medication helps but unable to take when he is on call due to having to be able to wake up.  . Gastroesophageal Reflux    Stable  . Hypertension    Well controlled and wife wanted to know if he could be taken off of medication    HPI  Neck and Back Spasm:pain is now for the past 4 months, he  developed pain on left side of neck and upper back. Happened suddenly and not sure if triggered by activity at work or at home. He has been taking otc medication without any help. Pain is described as dull constant pain with episodes of acute sharp pain. Still going to work but picking up debris makes symptoms worse. No weakness or tingling on back of left hand. He was seen chiropractor but just went for a few days, but unable to afford the co-pays  Major Depression: he is getting worse again, he is taking medication daily, but is getting snappy at home, wife states very pessimistic about everything, and that he is not the same. He feels tired all the time, anhedonia, he has not missed work recently because of it. Always worried getting in trouble at work. Very paranoid at work, he states they keep shifting who he can ride with at work.   HTN: controlled now, wife would like to have him off medication. He is only taking half pill daily in am's because he forgets to take evening dose, explained to stop taking it.   Insomnia: he is able to sleep well on Ambien, but unable to sleep at all when he can't take  it - for example when he is on call.   GERD: doing well at this time, taking Omeprazole daily, discussed risk of medication. Discussed risk of colitis, Alzheimers dementia, he will try to wean self off.     Patient Active Problem List   Diagnosis Date Noted  . History of vasculitis of skin 05/20/2015  . Sprain of left wrist 11/30/2014  . GERD without esophagitis 11/10/2014  . Seasonal allergic rhinitis 11/10/2014  . History of nephrolithiasis 11/10/2014  . Obesity (BMI 30-39.9) 11/10/2014  . Tobacco abuse 11/10/2014  . Snoring 11/10/2014  . Insomnia 11/10/2014    Past Surgical History:  Procedure Laterality Date  . FACIAL RECONSTRUCTION SURGERY  01/13/2008   After a car fell on him while working on it    Family History  Problem Relation Age of Onset  . Varicose Veins Mother   . Endometriosis Mother   . Drug abuse Father     Social History   Social History  . Marital status: Married    Spouse name: N/A  . Number of children: N/A  . Years of education: N/A   Occupational History  . Not on file.   Social History Main Topics  . Smoking status: Current Every Day Smoker  Packs/day: 0.25    Years: 15.00    Types: Cigarettes    Start date: 11/10/1999  . Smokeless tobacco: Never Used  . Alcohol use No  . Drug use: No  . Sexual activity: Yes    Partners: Female   Other Topics Concern  . Not on file   Social History Narrative  . No narrative on file     Current Outpatient Prescriptions:  .  ALPRAZolam (XANAX) 0.5 MG tablet, Take 1 tablet (0.5 mg total) by mouth daily as needed for anxiety., Disp: 45 tablet, Rfl: 0 .  atenolol (TENORMIN) 25 MG tablet, Take 0.5 tablets (12.5 mg total) by mouth 2 (two) times daily., Disp: 90 tablet, Rfl: 0 .  DULoxetine (CYMBALTA) 60 MG capsule, take 1 capsule by mouth once daily, Disp: 90 capsule, Rfl: 0 .  EPINEPHrine (EPIPEN 2-PAK) 0.3 mg/0.3 mL IJ SOAJ injection, Inject 0.3 mLs (0.3 mg total) into the muscle once., Disp: 1  Device, Rfl: 1 .  omeprazole (PRILOSEC) 20 MG capsule, Take 1 capsule (20 mg total) by mouth daily., Disp: 90 capsule, Rfl: 0 .  zolpidem (AMBIEN) 10 MG tablet, take 1 tablet by mouth at bedtime if needed for sleep, Disp: 90 tablet, Rfl: 0  Allergies  Allergen Reactions  . Bee Venom Anaphylaxis, Shortness Of Breath and Swelling  . Penicillins Hives    vasculitis     ROS  Constitutional: Negative for fever or weight change.  Respiratory: Negative for cough and shortness of breath.   Cardiovascular: Negative for chest pain or palpitations.  Gastrointestinal: Negative for abdominal pain, no bowel changes.  Musculoskeletal: Negative for gait problem or joint swelling.  Skin: Negative for rash.  Neurological: Negative for dizziness or headache.  No other specific complaints in a complete review of systems (except as listed in HPI above).  Objective  Vitals:   09/03/15 1121  BP: 120/68  Pulse: 99  Resp: 18  Temp: 98.5 F (36.9 C)  TempSrc: Oral  SpO2: 96%  Weight: 254 lb 11.2 oz (115.5 kg)  Height: 6' (1.829 m)    Body mass index is 34.54 kg/m.  Physical Exam  Constitutional: Patient appears well-developed and well-nourished. Obese No distress.  HEENT: head atraumatic, normocephalic, pupils equal and reactive to light, neck supple, throat within normal limits Cardiovascular: Normal rate, regular rhythm and normal heart sounds.  No murmur heard. No BLE edema. Pulmonary/Chest: Effort normal and breath sounds normal. No respiratory distress. Abdominal: Soft.  There is no tenderness. Psychiatric: Patient has a normal mood and affect. behavior is normal. Judgment and thought content normal.  PHQ2/9: Depression screen [facility] 2/9 07/08/2015 05/20/2015 04/16/2015 01/20/2015 11/10/2014  Decreased Interest 0 0 0 0 0  Down, Depressed, Hopeless 0 1 1 0 0  PHQ - 2 Score 0 1 1 0 0     Fall Risk: Fall Risk  07/08/2015 05/20/2015 04/16/2015 01/20/2015 11/10/2014  Falls in the past year?  No No No No No    Assessment & Plan  1. Muscle spasms of neck  Advised against muscle relaxer, advised massage at home, likely related to depression and feeling tense at work - ALPRAZolam Prudy Feeler) 0.5 MG tablet; Take 1 tablet (0.5 mg total) by mouth daily as needed for anxiety.  Dispense: 45 tablet; Refill: 0 - DULoxetine (CYMBALTA) 30 MG capsule; Take 3 capsules (90 mg total) by mouth daily.  Dispense: 90 capsule; Refill: 0  2. Muscle spasm of back  - ALPRAZolam (XANAX) 0.5 MG tablet; Take 1 tablet (0.5  mg total) by mouth daily as needed for anxiety.  Dispense: 45 tablet; Refill: 0 - DULoxetine (CYMBALTA) 30 MG capsule; Take 3 capsules (90 mg total) by mouth daily.  Dispense: 90 capsule; Refill: 0  3. Moderate major depression (HCC)  We will try higher dose of Cymbalta - ALPRAZolam (XANAX) 0.5 MG tablet; Take 1 tablet (0.5 mg total) by mouth daily as needed for anxiety.  Dispense: 45 tablet; Refill: 0 - DULoxetine (CYMBALTA) 30 MG capsule; Take 3 capsules (90 mg total) by mouth daily.  Dispense: 90 capsule; Refill: 0  4. Insomnia  - zolpidem (AMBIEN) 10 MG tablet; take 1 tablet by mouth at bedtime if needed for sleep  Dispense: 90 tablet; Refill: 0  5. GERD without esophagitis  Wean off Prisolec and try Ranitidine instead   6. Panic attack  - ALPRAZolam (XANAX) 0.5 MG tablet; Take 1 tablet (0.5 mg total) by mouth daily as needed for anxiety.  Dispense: 45 tablet; Refill: 0

## 2015-10-04 ENCOUNTER — Encounter: Payer: Self-pay | Admitting: Family Medicine

## 2015-10-04 ENCOUNTER — Ambulatory Visit (INDEPENDENT_AMBULATORY_CARE_PROVIDER_SITE_OTHER): Payer: BLUE CROSS/BLUE SHIELD | Admitting: Family Medicine

## 2015-10-04 VITALS — BP 118/64 | HR 92 | Temp 98.3°F | Resp 18 | Ht 72.0 in | Wt 253.3 lb

## 2015-10-04 DIAGNOSIS — G4700 Insomnia, unspecified: Secondary | ICD-10-CM | POA: Diagnosis not present

## 2015-10-04 DIAGNOSIS — K219 Gastro-esophageal reflux disease without esophagitis: Secondary | ICD-10-CM | POA: Diagnosis not present

## 2015-10-04 DIAGNOSIS — L739 Follicular disorder, unspecified: Secondary | ICD-10-CM

## 2015-10-04 DIAGNOSIS — L853 Xerosis cutis: Secondary | ICD-10-CM

## 2015-10-04 DIAGNOSIS — Z23 Encounter for immunization: Secondary | ICD-10-CM | POA: Diagnosis not present

## 2015-10-04 DIAGNOSIS — L850 Acquired ichthyosis: Secondary | ICD-10-CM | POA: Diagnosis not present

## 2015-10-04 DIAGNOSIS — F320 Major depressive disorder, single episode, mild: Secondary | ICD-10-CM

## 2015-10-04 MED ORDER — AMMONIUM LACTATE 12 % EX CREA: TOPICAL_CREAM | CUTANEOUS | 0 refills | Status: DC | PRN

## 2015-10-04 MED ORDER — DULOXETINE HCL 30 MG PO CPEP: 90.0000 mg | ORAL_CAPSULE | Freq: Every day | ORAL | 1 refills | Status: DC

## 2015-10-04 NOTE — Progress Notes (Signed)
Name: Timothy Fleming   MRN: [id number]    DOB: 25-Nov-1985   Date:10/04/2015       Progress Note  Subjective  Chief Complaint  Chief Complaint  Patient presents with  . Depression    1 month follow up  . Flu Vaccine  . Mass    under chin for about 3 weeks    HPI   Major Depression: he was getting worse again, he was  getting snappy at home, wife had told him that he was  very pessimistic about everything, and that he is not the same back in 08/2015, we increased dose of Cymbalta to 90 mg daily and he states his mood is better, having a better outlook about his job situation, trying to apply for another position. He has not been chewing fingers nails and his nails are longer now.   HTN: controlled now, he is off bp medication and bp is at goal, no chest pain or palpitation.   Insomnia: he is able to sleep well on Ambien - able to fall and stay asleep,  but unable to sleep at all when he can't take it   Folicullitis: he noticed a bump on his neck that was tender and had some drainage, he used hot compresses and neosporin, no longer painful but still has a scab and keeps touching it. No fever or chills.   Dry hands: he uses alcohol solution constantly at work and hands has been dry and pills at times.    Patient Active Problem List   Diagnosis Date Noted  . History of vasculitis of skin 05/20/2015  . Sprain of left wrist 11/30/2014  . GERD without esophagitis 11/10/2014  . Seasonal allergic rhinitis 11/10/2014  . History of nephrolithiasis 11/10/2014  . Obesity (BMI 30-39.9) 11/10/2014  . Tobacco abuse 11/10/2014  . Snoring 11/10/2014  . Insomnia 11/10/2014    Past Surgical History:  Procedure Laterality Date  . FACIAL RECONSTRUCTION SURGERY  01/13/2008   After a car fell on him while working on it    Family History  Problem Relation Age of Onset  . Varicose Veins Mother   . Endometriosis Mother   . Drug abuse Father     Social History   Social History  . Marital  status: Married    Spouse name: N/A  . Number of children: N/A  . Years of education: N/A   Occupational History  . Not on file.   Social History Main Topics  . Smoking status: Current Some Day Smoker    Packs/day: 0.25    Years: 15.00    Types: Cigarettes    Start date: 11/10/1999  . Smokeless tobacco: Never Used     Comment: e cig mostly  . Alcohol use No  . Drug use: No  . Sexual activity: Yes    Partners: Female   Other Topics Concern  . Not on file   Social History Narrative  . No narrative on file     Current Outpatient Prescriptions:  .  ALPRAZolam (XANAX) 0.5 MG tablet, Take 1 tablet (0.5 mg total) by mouth daily as needed for anxiety., Disp: 45 tablet, Rfl: 0 .  ammonium lactate (LAC-HYDRIN) 12 % cream, Apply topically as needed for dry skin., Disp: 385 g, Rfl: 0 .  DULoxetine (CYMBALTA) 30 MG capsule, Take 3 capsules (90 mg total) by mouth daily., Disp: 90 capsule, Rfl: 1 .  EPINEPHrine (EPIPEN 2-PAK) 0.3 mg/0.3 mL IJ SOAJ injection, Inject 0.3 mLs (0.3 mg total)  into the muscle once., Disp: 1 Device, Rfl: 1 .  omeprazole (PRILOSEC) 20 MG capsule, Take 1 capsule (20 mg total) by mouth daily., Disp: 90 capsule, Rfl: 0 .  zolpidem (AMBIEN) 10 MG tablet, take 1 tablet by mouth at bedtime if needed for sleep, Disp: 90 tablet, Rfl: 0  Allergies  Allergen Reactions  . Bee Venom Anaphylaxis, Shortness Of Breath and Swelling  . Penicillins Hives    vasculitis     ROS  Ten systems reviewed and is negative except as mentioned in HPI   Objective  Vitals:   10/04/15 1126  BP: 118/64  Pulse: 92  Resp: 18  Temp: 98.3 F (36.8 C)  SpO2: 98%  Weight: 253 lb 5 oz (114.9 kg)  Height: 6' (1.829 m)    Body mass index is 34.36 kg/m.  Physical Exam  Constitutional: Patient appears well-developed and well-nourished. Obese  No distress.  HEENT: head atraumatic, normocephalic, pupils equal and reactive to light,  neck supple, throat within normal  limits Cardiovascular: Normal rate, regular rhythm and normal heart sounds.  No murmur heard. No BLE edema. Pulmonary/Chest: Effort normal and breath sounds normal. No respiratory distress. Abdominal: Soft.  There is no tenderness. Psychiatric: Patient has a normal mood and affect. behavior is normal. Judgment and thought content normal. Skin: dry and pilling, nails are growing, also has small scab on his neck, no redness or increase in warmth, no drainage  PHQ2/9: [facility] 2/9 07/08/2015 05/20/2015 04/16/2015 01/20/2015 11/10/2014  Decreased Interest 0 0 0 0 0  Down, Depressed, Hopeless 0 1 1 0 0  PHQ - 2 Score 0 1 1 0 0     Fall Risk: Fall Risk  07/08/2015 05/20/2015 04/16/2015 01/20/2015 11/10/2014  Falls in the past year? No No No No No    Assessment & Plan  1. Insomnia  Continue medication   2. Need for influenza vaccination  - Flu Vaccine QUAD 36+ mos PF IM (Fluarix & Fluzone Quad PF)   3. Mild major depression (HCC)  - DULoxetine (CYMBALTA) 30 MG capsule; Take 3 capsules (90 mg total) by mouth daily.  Dispense: 90 capsule; Refill: 1  4. GERD without esophagitis  stable  5. Dry skin dermatitis  - ammonium lactate (LAC-HYDRIN) 12 % cream; Apply topically as needed for dry skin.  Dispense: 385 g; Refill: 0  6. Folliculitis  Resolved, only some induration and scab, advised to stop touching it

## 2015-12-06 ENCOUNTER — Ambulatory Visit: Payer: BLUE CROSS/BLUE SHIELD | Admitting: Family Medicine

## 2015-12-09 ENCOUNTER — Other Ambulatory Visit: Payer: Self-pay | Admitting: Family Medicine

## 2015-12-09 DIAGNOSIS — M62830 Muscle spasm of back: Secondary | ICD-10-CM

## 2015-12-09 DIAGNOSIS — M62838 Other muscle spasm: Secondary | ICD-10-CM

## 2015-12-09 DIAGNOSIS — F321 Major depressive disorder, single episode, moderate: Secondary | ICD-10-CM

## 2015-12-09 DIAGNOSIS — G4700 Insomnia, unspecified: Secondary | ICD-10-CM

## 2015-12-09 DIAGNOSIS — F410 Panic disorder [episodic paroxysmal anxiety] without agoraphobia: Secondary | ICD-10-CM

## 2015-12-10 NOTE — Telephone Encounter (Signed)
Patient requesting refill of alprazolam and ambien to [facility].

## 2015-12-13 ENCOUNTER — Other Ambulatory Visit: Payer: Self-pay | Admitting: Family Medicine

## 2015-12-13 DIAGNOSIS — F410 Panic disorder [episodic paroxysmal anxiety] without agoraphobia: Secondary | ICD-10-CM

## 2015-12-13 DIAGNOSIS — F321 Major depressive disorder, single episode, moderate: Secondary | ICD-10-CM

## 2015-12-13 DIAGNOSIS — M62830 Muscle spasm of back: Secondary | ICD-10-CM

## 2015-12-13 DIAGNOSIS — M62838 Other muscle spasm: Secondary | ICD-10-CM

## 2015-12-13 DIAGNOSIS — G4700 Insomnia, unspecified: Secondary | ICD-10-CM

## 2015-12-13 NOTE — Telephone Encounter (Signed)
[facility]

## 2015-12-22 NOTE — Telephone Encounter (Signed)
Wife scheduled appointment for 01/12/16.

## 2016-01-04 ENCOUNTER — Encounter: Payer: Self-pay | Admitting: Family Medicine

## 2016-01-04 ENCOUNTER — Ambulatory Visit (INDEPENDENT_AMBULATORY_CARE_PROVIDER_SITE_OTHER): Payer: BLUE CROSS/BLUE SHIELD | Admitting: Family Medicine

## 2016-01-04 VITALS — BP 118/68 | HR 100 | Temp 98.0°F | Resp 16 | Ht 72.0 in | Wt 253.2 lb

## 2016-01-04 DIAGNOSIS — G4709 Other insomnia: Secondary | ICD-10-CM | POA: Diagnosis not present

## 2016-01-04 DIAGNOSIS — M25562 Pain in left knee: Secondary | ICD-10-CM | POA: Diagnosis not present

## 2016-01-04 DIAGNOSIS — K219 Gastro-esophageal reflux disease without esophagitis: Secondary | ICD-10-CM

## 2016-01-04 DIAGNOSIS — F321 Major depressive disorder, single episode, moderate: Secondary | ICD-10-CM

## 2016-01-04 MED ORDER — MELOXICAM 15 MG PO TABS: 15.0000 mg | ORAL_TABLET | Freq: Every day | ORAL | 0 refills | Status: DC

## 2016-01-04 MED ORDER — OMEPRAZOLE 20 MG PO CPDR: 20.0000 mg | DELAYED_RELEASE_CAPSULE | Freq: Every day | ORAL | 0 refills | Status: DC

## 2016-01-04 MED ORDER — ZOLPIDEM TARTRATE 10 MG PO TABS: ORAL_TABLET | ORAL | 2 refills | Status: DC

## 2016-01-04 NOTE — Progress Notes (Signed)
Name: Timothy SnookCasey L Harps   MRN: [id number]    DOB: 03/11/1985   Date:01/04/2016       Progress Note  Subjective  Chief Complaint  Chief Complaint  Patient presents with  . Knee Pain    pt went to do a squat on Sunday and heard a pop and has had gradually worsening pain in his left knee    HPI  Major Depression: he was getting worse again, he was  getting snappy at home, wife had told him that he was  very pessimistic about everything, and that he is not the same back in 08/2015, we increased dose of Cymbalta to 90 mg daily and he states his mood is better, having a better outlook about his job situation, trying to apply for another position. He has not been chewing fingers nails and his nails are longer now.   HTN: controlled now, he is off bp medication and bp is at goal, no chest pain or palpitation.   Insomnia: he is able to sleep well on Ambien - able to fall and stay asleep,  but unable to sleep at all when he can't take it   Left knee pain: he was shopping four days ago and felt like his left knee was stiff, so he squatted down to make it pop and felt a louder popping sound on left lateral knee and has a constant pain on left lateral knee since, difficulty bearing weight, no effusion, but had a bruise the day of the injury. He has decrease rom secondary to pain   Patient Active Problem List   Diagnosis Date Noted  . History of vasculitis of skin 05/20/2015  . Sprain of left wrist 11/30/2014  . GERD without esophagitis 11/10/2014  . Seasonal allergic rhinitis 11/10/2014  . History of nephrolithiasis 11/10/2014  . Obesity (BMI 30-39.9) 11/10/2014  . Tobacco abuse 11/10/2014  . Snoring 11/10/2014  . Insomnia 11/10/2014    Past Surgical History:  Procedure Laterality Date  . FACIAL RECONSTRUCTION SURGERY  01/13/2008   After a car fell on him while working on it    Family History  Problem Relation Age of Onset  . Varicose Veins Mother   . Endometriosis Mother   . Drug  abuse Father     Social History   Social History  . Marital status: Married    Spouse name: N/A  . Number of children: N/A  . Years of education: N/A   Occupational History  . Not on file.   Social History Main Topics  . Smoking status: Current Some Day Smoker    Packs/day: 0.25    Years: 15.00    Types: Cigarettes    Start date: 11/10/1999  . Smokeless tobacco: Never Used     Comment: e cig mostly  . Alcohol use No  . Drug use: No  . Sexual activity: Yes    Partners: Female   Other Topics Concern  . Not on file   Social History Narrative  . No narrative on file     Current Outpatient Prescriptions:  .  ALPRAZolam (XANAX) 0.5 MG tablet, take 1 tablet by mouth once daily anxiety, Disp: 45 tablet, Rfl: 0 .  ammonium lactate (LAC-HYDRIN) 12 % cream, Apply topically as needed for dry skin., Disp: 385 g, Rfl: 0 .  DULoxetine (CYMBALTA) 30 MG capsule, Take 3 capsules (90 mg total) by mouth daily., Disp: 90 capsule, Rfl: 1 .  EPINEPHrine (EPIPEN 2-PAK) 0.3 mg/0.3 mL IJ SOAJ injection, Inject  0.3 mLs (0.3 mg total) into the muscle once., Disp: 1 Device, Rfl: 1 .  omeprazole (PRILOSEC) 20 MG capsule, Take 1 capsule (20 mg total) by mouth daily., Disp: 90 capsule, Rfl: 0 .  zolpidem (AMBIEN) 10 MG tablet, take 1 tablet by mouth at bedtime for sleep, Disp: 30 tablet, Rfl: 0  Allergies  Allergen Reactions  . Bee Venom Anaphylaxis, Shortness Of Breath and Swelling  . Penicillins Hives    vasculitis     ROS  Constitutional: Negative for fever or weight change.  Respiratory: Negative for cough and shortness of breath.   Cardiovascular: Negative for chest pain or palpitations.  Gastrointestinal: Negative for abdominal pain, no bowel changes.  Musculoskeletal: Positive  for gait problem and right  joint swelling.  Skin: Negative for rash.  Neurological: Negative for dizziness or headache.  No other specific complaints in a complete review of systems (except as listed in HPI  above).  Objective  Vitals:   01/04/16 1506  BP: 118/68  Pulse: 100  Resp: 16  Temp: 98 F (36.7 C)  TempSrc: Oral  SpO2: 98%  Weight: 253 lb 4 oz (114.9 kg)  Height: 6' (1.829 m)    Body mass index is 34.35 kg/m.  Physical Exam   Constitutional: Patient appears well-developed and well-nourished. Obese  No distress.  HEENT: head atraumatic, normocephalic, pupils equal and reactive to light, neck supple, throat within normal limits Cardiovascular: Normal rate, regular rhythm and normal heart sounds.  No murmur heard. No BLE edema. Pulmonary/Chest: Effort normal and breath sounds normal. No respiratory distress. Abdominal: Soft.  There is no tenderness. Psychiatric: Patient has a normal mood and affect. behavior is normal. Judgment and thought content normal. Muscular Skeletal: he is unable to relax to check ligaments, his left posterior lateral aspect of knee is very sore to touch, mild crepitus with extension of left knee, no effusion or increase in warmth   PHQ2/9: Depression screen [facility] 07/08/2015 05/20/2015 04/16/2015 01/20/2015 11/10/2014  Decreased Interest 0 0 0 0 0  Down, Depressed, Hopeless 0 1 1 0 0  PHQ - 2 Score 0 1 1 0 0     Fall Risk: Fall Risk  07/08/2015 05/20/2015 04/16/2015 01/20/2015 11/10/2014  Falls in the past year? No No No No No     Assessment & Plan  1. Acute pain of left knee  - meloxicam (MOBIC) 15 MG tablet; Take 1 tablet (15 mg total) by mouth daily.  Dispense: 30 tablet; Refill: 0 It seems to be a strain, but it could a ligament tear ( less likely ), discussed Ortho referral, MRI knee or try nsaids and he chose to see Ortho - Ambulatory referral to Orthopedic Surgery  2. GERD without esophagitis  - omeprazole (PRILOSEC) 20 MG capsule; Take 1 capsule (20 mg total) by mouth daily.  Dispense: 90 capsule; Refill: 0  3. Moderate major depression (HCC)  Doing better , he is weaning self off , down from 90 to 60 mg and is doing well,  different supervisors  4. Other insomnia  Doing well on medication  - zolpidem (AMBIEN) 10 MG tablet; take 1 tablet by mouth at bedtime for sleep  Dispense: 30 tablet; Refill: 2

## 2016-01-12 ENCOUNTER — Ambulatory Visit: Payer: BLUE CROSS/BLUE SHIELD | Admitting: Family Medicine

## 2016-02-21 ENCOUNTER — Other Ambulatory Visit: Payer: Self-pay | Admitting: Family Medicine

## 2016-02-21 DIAGNOSIS — F320 Major depressive disorder, single episode, mild: Secondary | ICD-10-CM

## 2016-02-21 MED ORDER — DULOXETINE HCL 60 MG PO CPEP: 60.0000 mg | ORAL_CAPSULE | Freq: Every day | ORAL | 2 refills | Status: DC

## 2016-02-21 NOTE — Telephone Encounter (Signed)
Patient stated he is only taking 1/day at (60 mg)  Please send in new Rx.

## 2016-02-21 NOTE — Telephone Encounter (Signed)
Is he taking two capsules now? I can switch to 60 mg

## 2016-04-13 ENCOUNTER — Other Ambulatory Visit: Payer: Self-pay | Admitting: Family Medicine

## 2016-04-13 DIAGNOSIS — G4709 Other insomnia: Secondary | ICD-10-CM

## 2016-04-13 NOTE — Telephone Encounter (Signed)
Patient requesting refill of Ambien to [facility]. 

## 2016-04-14 NOTE — Telephone Encounter (Signed)
Spoke with wife and she will have pt to return call to schedule appointment.

## 2016-04-18 ENCOUNTER — Ambulatory Visit (INDEPENDENT_AMBULATORY_CARE_PROVIDER_SITE_OTHER): Payer: BLUE CROSS/BLUE SHIELD | Admitting: Family Medicine

## 2016-04-18 ENCOUNTER — Encounter: Payer: Self-pay | Admitting: Family Medicine

## 2016-04-18 VITALS — BP 114/64 | HR 95 | Temp 98.4°F | Resp 18 | Ht 72.0 in | Wt 254.2 lb

## 2016-04-18 DIAGNOSIS — K219 Gastro-esophageal reflux disease without esophagitis: Secondary | ICD-10-CM | POA: Diagnosis not present

## 2016-04-18 DIAGNOSIS — F410 Panic disorder [episodic paroxysmal anxiety] without agoraphobia: Secondary | ICD-10-CM

## 2016-04-18 DIAGNOSIS — F320 Major depressive disorder, single episode, mild: Secondary | ICD-10-CM | POA: Diagnosis not present

## 2016-04-18 DIAGNOSIS — G4709 Other insomnia: Secondary | ICD-10-CM | POA: Diagnosis not present

## 2016-04-18 MED ORDER — DULOXETINE HCL 60 MG PO CPEP: 60.0000 mg | ORAL_CAPSULE | Freq: Every day | ORAL | 1 refills | Status: DC

## 2016-04-18 MED ORDER — SUVOREXANT 10 MG PO TABS: 1.0000 | ORAL_TABLET | Freq: Every day | ORAL | 2 refills | Status: DC

## 2016-04-18 MED ORDER — OMEPRAZOLE 20 MG PO CPDR: 20.0000 mg | DELAYED_RELEASE_CAPSULE | Freq: Every day | ORAL | 1 refills | Status: DC

## 2016-04-18 NOTE — Progress Notes (Signed)
Name: Timothy Fleming   MRN: [id number]    DOB: 1985-01-25   Date:04/18/2016       Progress Note  Subjective  Chief Complaint  Chief Complaint  Patient presents with  . Medication Refill    3 month F/U  . Depression    Stable, feels like he is losing some of his memories. Work stress always has him in fear of losing his job.   . Insomnia    Needs refill of medication  . 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   HTN: controlled now, he is off bp medication and bp is at goal, no chest pain or palpitation.   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 He has noticed some amnesia on Ambien and we will try belsomra again   GERD: taking Omeprazole, usually triggered by spicy food, like last night , he ate chips and salsa and it caused burning sensation   Patient Active Problem List   Diagnosis Date Noted  . History of vasculitis of skin 05/20/2015  . Sprain of left wrist 11/30/2014  . GERD without esophagitis 11/10/2014  . Seasonal allergic rhinitis 11/10/2014  . History of nephrolithiasis 11/10/2014  . Obesity (BMI 30-39.9) 11/10/2014  . Tobacco abuse 11/10/2014  . Snoring 11/10/2014  . Insomnia 11/10/2014    Past Surgical History:  Procedure Laterality Date  . FACIAL RECONSTRUCTION SURGERY  01/13/2008   After a car fell on him while working on it    Family History  Problem  Relation Age of Onset  . Varicose Veins Mother   . Endometriosis Mother   . Drug abuse Father     Social History   Social History  . Marital status: Married    Spouse name: N/A  . Number of children: N/A  . Years of education: N/A   Occupational History  . Not on file.   Social History Main Topics  . Smoking status: Current Some Day Smoker    Packs/day: 0.25    Years: 15.00    Types: Cigarettes    Start date: 11/10/1999  . Smokeless tobacco: Never Used     Comment: e cig mostly  . Alcohol use No  . Drug use: No  . Sexual activity: Yes    Partners: Female   Other Topics Concern  . Not on file   Social History Narrative   He is married has one son.    He was addicted to video games, but is back playing guitar and going to the gym since 2018     Current Outpatient Prescriptions:  .  ALPRAZolam (XANAX) 0.5 MG tablet, take 1 tablet by mouth once daily anxiety, Disp: 45 tablet, Rfl: 0 .  ammonium lactate (LAC-HYDRIN) 12 % cream, Apply topically as needed for dry skin., Disp:  385 g, Rfl: 0 .  DULoxetine (CYMBALTA) 60 MG capsule, Take 1 capsule (60 mg total) by mouth daily., Disp: 90 capsule, Rfl: 1 .  EPINEPHrine (EPIPEN 2-PAK) 0.3 mg/0.3 mL IJ SOAJ injection, Inject 0.3 mLs (0.3 mg total) into the muscle once., Disp: 1 Device, Rfl: 1 .  omeprazole (PRILOSEC) 20 MG capsule, Take 1 capsule (20 mg total) by mouth daily., Disp: 90 capsule, Rfl: 1 .  Suvorexant (BELSOMRA) 10 MG TABS, Take 1 tablet by mouth daily. In place of Ambien, Disp: 30 tablet, Rfl: 2  Allergies  Allergen Reactions  . Bee Venom Anaphylaxis, Shortness Of Breath and Swelling  . Penicillins Hives    vasculitis     ROS  Constitutional: Negative for fever or weight change.  Respiratory: Negative for cough and shortness of breath.   Cardiovascular: Negative for chest pain or palpitations.  Gastrointestinal: Negative for abdominal pain, no bowel changes.  Musculoskeletal: Negative for gait problem or  joint swelling.  Skin: Negative for rash.  Neurological: Negative for dizziness or headache.  No other specific complaints in a complete review of systems (except as listed in HPI above).  Objective  Vitals:   04/18/16 1543  BP: 114/64  Pulse: 95  Resp: 18  Temp: 98.4 F (36.9 C)  TempSrc: Oral  SpO2: 95%  Weight: 254 lb 3.2 oz (115.3 kg)  Height: 6' (1.829 m)    Body mass index is 34.48 kg/m.  Physical Exam  Constitutional: Patient appears well-developed and well-nourished. Obese No distress.  HEENT: head atraumatic, normocephalic, pupils equal and reactive to light,  neck supple, throat within normal limits Cardiovascular: Normal rate, regular rhythm and normal heart sounds.  No murmur heard. No BLE edema. Pulmonary/Chest: Effort normal and breath sounds normal. No respiratory distress. Abdominal: Soft.  There is no tenderness. Psychiatric: Patient has a normal mood and affect. behavior is normal. Judgment and thought content normal.  PHQ2/9: [facility] screen [facility] 04/18/2016 07/08/2015 05/20/2015 04/16/2015 01/20/2015  Decreased Interest 0 0 0 0 0  Down, Depressed, Hopeless 0 0 1 1 0  PHQ - 2 Score 0 0 1 1 0     Fall Risk: Fall Risk  04/18/2016 07/08/2015 05/20/2015 04/16/2015 01/20/2015  Falls in the past year? No No No No No     Functional Status Survey: Is the patient deaf or have difficulty hearing?: No Does the patient have difficulty seeing, even when wearing glasses/contacts?: No Does the patient have difficulty concentrating, remembering, or making decisions?: No Does the patient have difficulty walking or climbing stairs?: No Does the patient have difficulty dressing or bathing?: No Does the patient have difficulty doing errands alone such as visiting a doctor's office or shopping?: No    Assessment & Plan  1. GERD without esophagitis  - omeprazole (PRILOSEC) 20 MG capsule; Take 1 capsule (20 mg total) by mouth daily.  Dispense: 90 capsule; Refill:  1  2. Other insomnia  He could not tolerate Trazodone, and Ambien is causing memory loss, we will try getting Belsomra again  - Suvorexant (BELSOMRA) 10 MG TABS; Take 1 tablet by mouth daily. In place of Ambien  Dispense: 30 tablet; Refill: 2  3. Mild major depression (HCC)  - DULoxetine (CYMBALTA) 60 MG capsule; Take 1 capsule (60 mg total) by mouth daily.  Dispense: 90 capsule; Refill: 1  4. Panic attack  Very seldom now, still has alprazolam at home

## 2016-04-20 ENCOUNTER — Telehealth: Payer: Self-pay | Admitting: Family Medicine

## 2016-04-20 NOTE — Telephone Encounter (Signed)
I explained to him that it may take a week, he needs to take 10 mg for a few days and can go up to two pills, 20 mg after that

## 2016-04-20 NOTE — Telephone Encounter (Signed)
Pt states Dr Carlynn PurlSowles put in on new sleeping medication and he took it last night and states it did not work and is asking for something different. Please advise. Pt can be reached on the home # or [phone]

## 2016-04-24 NOTE — Telephone Encounter (Signed)
Left voicemail for patient to return my call. 

## 2016-05-08 ENCOUNTER — Other Ambulatory Visit: Payer: Self-pay | Admitting: Family Medicine

## 2016-05-08 DIAGNOSIS — M62838 Other muscle spasm: Secondary | ICD-10-CM

## 2016-05-08 DIAGNOSIS — F321 Major depressive disorder, single episode, moderate: Secondary | ICD-10-CM

## 2016-05-08 DIAGNOSIS — F410 Panic disorder [episodic paroxysmal anxiety] without agoraphobia: Secondary | ICD-10-CM

## 2016-05-08 DIAGNOSIS — M62830 Muscle spasm of back: Secondary | ICD-10-CM

## 2016-06-21 ENCOUNTER — Telehealth: Payer: Self-pay | Admitting: Family Medicine

## 2016-06-21 NOTE — Telephone Encounter (Signed)
Pt wife asking for you to give her a call. About her husband. Said patient stopped his depression medication all at once and she thinks that he is having with drawls.

## 2016-06-21 NOTE — Telephone Encounter (Signed)
Spoke with wife he stopped Cymbalta cold [place] and informed her this is not a good medication to stop suddenly. Patient usually have to consult with Dr. Carlynn PurlSowles when wanting to stop medication. Informed Anner CreteKimberely Mcburney that Dr. Carlynn PurlSowles recommends taking some of the beads every day from the capsule. Until he is out of them to titrate down with no symptoms.

## 2016-07-19 ENCOUNTER — Ambulatory Visit (INDEPENDENT_AMBULATORY_CARE_PROVIDER_SITE_OTHER): Payer: BLUE CROSS/BLUE SHIELD | Admitting: Family Medicine

## 2016-07-19 ENCOUNTER — Encounter: Payer: Self-pay | Admitting: Family Medicine

## 2016-07-19 VITALS — BP 112/68 | HR 87 | Temp 98.5°F | Resp 16 | Ht 72.0 in | Wt 263.3 lb

## 2016-07-19 DIAGNOSIS — F321 Major depressive disorder, single episode, moderate: Secondary | ICD-10-CM | POA: Diagnosis not present

## 2016-07-19 DIAGNOSIS — L249 Irritant contact dermatitis, unspecified cause: Secondary | ICD-10-CM | POA: Diagnosis not present

## 2016-07-19 DIAGNOSIS — F410 Panic disorder [episodic paroxysmal anxiety] without agoraphobia: Secondary | ICD-10-CM | POA: Diagnosis not present

## 2016-07-19 DIAGNOSIS — K219 Gastro-esophageal reflux disease without esophagitis: Secondary | ICD-10-CM | POA: Diagnosis not present

## 2016-07-19 DIAGNOSIS — G4709 Other insomnia: Secondary | ICD-10-CM

## 2016-07-19 MED ORDER — TRIAMCINOLONE ACETONIDE 0.1 % EX CREA: 1.0000 "application " | TOPICAL_CREAM | Freq: Two times a day (BID) | CUTANEOUS | 0 refills | Status: DC

## 2016-07-19 MED ORDER — ALPRAZOLAM 0.5 MG PO TABS: ORAL_TABLET | ORAL | 0 refills | Status: DC

## 2016-07-19 MED ORDER — RANITIDINE HCL 150 MG PO TABS: 150.0000 mg | ORAL_TABLET | Freq: Every day | ORAL | 1 refills | Status: DC

## 2016-07-19 MED ORDER — QUETIAPINE FUMARATE 25 MG PO TABS: 25.0000 mg | ORAL_TABLET | Freq: Every day | ORAL | 1 refills | Status: DC

## 2016-07-19 NOTE — Progress Notes (Signed)
Name: Timothy Fleming   MRN: [id number]    DOB: April 27, 1985   Date:07/19/2016       Progress Note  Subjective  Chief Complaint  Chief Complaint  Patient presents with  . Foot Swelling    Right foot feel like it has fluid   . 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 Notes rash is very itchy and he has been using hydrocortisone cream with some relief. Also notes ball of right foot feels like a "bubble" is under the pad of the foot.  Major Depression: he was struggling throughout 2017, he is feeling really great and is trying to wean himself off - breaking open capsules and removing a certain number of beads each day. Notes some numbness in hands with tapering down - when this happens, he slows down the taper.  Discussed going very slow to avoid SE's. 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  HTN: controlled now, he is off bp medication and bp is at goal, no chest pain or palpitation, no swelling or shortness of breath.   Panic attack: doing much better at work, is going for a promotion.  Takes Xanax 0-3 per week depending on stress levels - needs refill today - last refill was 05/08/2016.  He is agreeable to decrease Xanax use and will be coming off of this along with the Cymbalta  Insomnia: Doesn't like Belsomra as much as Ambien, but he does not want to use Ambien again; he tried Trazodone but made him foggy, Ambien caused memory issues.  He'd like to try Seroquel today because he is still waking in the middle of the night.  Discussed use of Seroquel and leaving enough time to sleep while adjusting to the medication.    GERD: taking Omeprazole, usually triggered by spicy food, like last night , he ate chips and salsa and it caused burning sensation. Discussed PPI's - Willing to try alternating with Ranitidine to eventually taper down.  Patient Active Problem List   Diagnosis Date Noted  . History of vasculitis of skin 05/20/2015  . Sprain of left wrist 11/30/2014  . GERD without esophagitis 11/10/2014  . Seasonal allergic rhinitis 11/10/2014  . History of nephrolithiasis 11/10/2014  . Obesity (BMI 30-39.9) 11/10/2014  . Tobacco abuse 11/10/2014  . Snoring 11/10/2014  . Insomnia 11/10/2014    Past Surgical History:  Procedure Laterality Date  . FACIAL RECONSTRUCTION SURGERY  01/13/2008   After a car fell on him while working on it    Family History  Problem Relation Age of Onset  . Varicose Veins Mother   . Endometriosis Mother   . Drug abuse Father     Social History   Social History  . Marital status: Married    Spouse name: N/A  . Number of children: N/A  . Years of education: N/A   Occupational History  . Not on file.   Social History Main Topics  . Smoking status: Current Some Day Smoker    Packs/day: 0.25  Years: 15.00    Types: Cigarettes    Start date: 11/10/1999  . Smokeless tobacco: Never Used     Comment: e cig mostly  . Alcohol use No  . Drug use: No  . Sexual activity: Yes    Partners: Female   Other Topics Concern  . Not on file   Social History Narrative   He is married has one son.    He was addicted to video games, but is back playing guitar and going to the gym since 2018    Current Outpatient Prescriptions:  .  ALPRAZolam (XANAX) 0.5 MG tablet, take 1 tablet by mouth once daily for anxiety, Disp: 30 tablet, Rfl: 0 .  ammonium lactate (LAC-HYDRIN) 12 % cream, Apply topically as needed for dry skin., Disp: 385 g, Rfl: 0 .  DULoxetine (CYMBALTA) 60 MG capsule, Take 1 capsule (60 mg total) by mouth daily., Disp: 90 capsule, Rfl: 1 .  EPINEPHrine  (EPIPEN 2-PAK) 0.3 mg/0.3 mL IJ SOAJ injection, Inject 0.3 mLs (0.3 mg total) into the muscle once., Disp: 1 Device, Rfl: 1 .  omeprazole (PRILOSEC) 20 MG capsule, Take 1 capsule (20 mg total) by mouth daily., Disp: 90 capsule, Rfl: 1 .  Suvorexant (BELSOMRA) 10 MG TABS, Take 1 tablet by mouth daily. In place of Ambien, Disp: 30 tablet, Rfl: 2  Allergies  Allergen Reactions  . Bee Venom Anaphylaxis, Shortness Of Breath and Swelling  . Penicillins Hives    vasculitis     ROS  Constitutional: Negative for fever or weight change.  Respiratory: Negative for cough and shortness of breath.   Cardiovascular: Negative for chest pain or palpitations.  Gastrointestinal: Negative for abdominal pain, no bowel changes.  Musculoskeletal: Negative for gait problem or joint swelling.  Skin: Negative for rash.  Neurological: Negative for dizziness or headache.  No other specific complaints in a complete review of systems (except as listed in HPI above).  Objective  Vitals:   07/19/16 1558  BP: 112/68  Pulse: 87  Resp: 16  Temp: 98.5 F (36.9 C)  TempSrc: Oral  SpO2: 99%  Weight: 263 lb 4.8 oz (119.4 kg)  Height: 6' (1.829 m)    Body mass index is 35.71 kg/m.  Physical Exam Constitutional: Patient appears well-developed and well-nourished. Obese No distress.  HEENT: head atraumatic, normocephalic, pupils equal and reactive to light, neck supple, throat within normal limits Cardiovascular: Normal rate, regular rhythm and normal heart sounds.  No murmur heard. No BLE edema. Pulmonary/Chest: Effort normal and breath sounds normal. No respiratory distress. Abdominal: Soft.  There is no tenderness. Psychiatric: Patient has a normal mood and affect. behavior is normal. Judgment and thought content normal. Skin: erythematous macular rash to bilateral anterior feet. No excoriation or bleeding or discharge.  No results found for this or any previous visit (from the past 2160  hour(s)).  PHQ2/9: Depression screen [facility] 2/[facility] 07/19/2016 04/18/2016 07/08/2015 05/20/2015 04/16/2015  Decreased Interest 0 0 0 0 0  Down, Depressed, Hopeless 0 0 0 1 1  PHQ - 2 Score 0 0 0 1 1    Fall Risk: Fall Risk  07/19/2016 04/18/2016 07/08/2015 05/20/2015 04/16/2015  Falls in the past year? No No No No No   Functional Status Survey: Is the patient deaf or have difficulty hearing?: No Does the patient have difficulty seeing, even when wearing glasses/contacts?: Yes Does the patient have difficulty concentrating, remembering, or making decisions?: No Does the patient have difficulty walking or climbing stairs?: No Does the  patient have difficulty dressing or bathing?: No Does the patient have difficulty doing errands alone such as visiting a doctor's office or shopping?: No   Assessment & Plan  1. Panic attack  Resolving, he will take medication prn only for panic attack - ALPRAZolam (XANAX) 0.5 MG tablet; take 1 tablet by mouth once daily for anxiety  Dispense: 15 tablet; Refill: 0  2. Moderate major depression (HCC)  He is weaning self off Duloxetine and last rx of Alprazolam lasted 30 days, he states he feels confident weaning self off even more - ALPRAZolam (XANAX) 0.5 MG tablet; take 1 tablet by mouth once daily for anxiety  Dispense: 15 tablet; Refill: 0  3. Irritant contact dermatitis, unspecified trigger  Advised to take extra pair of socks to work and change it if he notices moisture.  - triamcinolone cream (KENALOG) 0.1 %; Apply 1 application topically 2 (two) times daily.  Dispense: 30 g; Refill: 0  4. GERD without esophagitis  He is willing to try weaning self off and monitor, we will add Ranitidine to alternate with PPI  5. Other insomnia  He did not like Belsomra, still waking up during the night, Ambien caused amnesia and Trazodone sedation. Discussed possible side effects of medication, advised to start on Friday and not right before bed time to avoid daytime  sedation - QUEtiapine (SEROQUEL) 25 MG tablet; Take 1 tablet (25 mg total) by mouth at bedtime.  Dispense: 30 tablet; Refill: 1

## 2016-09-15 ENCOUNTER — Ambulatory Visit (INDEPENDENT_AMBULATORY_CARE_PROVIDER_SITE_OTHER): Payer: 59 | Admitting: Family Medicine

## 2016-09-15 ENCOUNTER — Encounter: Payer: Self-pay | Admitting: Family Medicine

## 2016-09-15 ENCOUNTER — Encounter: Payer: BLUE CROSS/BLUE SHIELD | Admitting: Family Medicine

## 2016-09-15 VITALS — BP 112/66 | HR 72 | Temp 98.6°F | Resp 16 | Ht 72.0 in | Wt 250.8 lb

## 2016-09-15 DIAGNOSIS — G4709 Other insomnia: Secondary | ICD-10-CM

## 2016-09-15 DIAGNOSIS — F320 Major depressive disorder, single episode, mild: Secondary | ICD-10-CM

## 2016-09-15 DIAGNOSIS — Z23 Encounter for immunization: Secondary | ICD-10-CM

## 2016-09-15 DIAGNOSIS — Z131 Encounter for screening for diabetes mellitus: Secondary | ICD-10-CM | POA: Diagnosis not present

## 2016-09-15 DIAGNOSIS — Z13220 Encounter for screening for lipoid disorders: Secondary | ICD-10-CM

## 2016-09-15 DIAGNOSIS — R69 Illness, unspecified: Secondary | ICD-10-CM | POA: Diagnosis not present

## 2016-09-15 DIAGNOSIS — K219 Gastro-esophageal reflux disease without esophagitis: Secondary | ICD-10-CM

## 2016-09-15 DIAGNOSIS — Z0000 Encounter for general adult medical examination without abnormal findings: Secondary | ICD-10-CM

## 2016-09-15 LAB — CBC WITH DIFFERENTIAL/PLATELET
BASOS ABS: 0 {cells}/uL (ref 0–200)
Basophils Relative: 0 %
EOS PCT: 3 %
Eosinophils Absolute: 246 cells/uL (ref 15–500)
HCT: 44.4 % (ref 38.5–50.0)
Hemoglobin: 14.9 g/dL (ref 13.2–17.1)
Lymphocytes Relative: 28 %
Lymphs Abs: 2296 cells/uL (ref 850–3900)
MCH: 30.2 pg (ref 27.0–33.0)
MCHC: 33.6 g/dL (ref 32.0–36.0)
MCV: 90.1 fL (ref 80.0–100.0)
MONOS PCT: 11 %
MPV: 9.3 fL (ref 7.5–12.5)
Monocytes Absolute: 902 cells/uL (ref 200–950)
NEUTROS PCT: 58 %
Neutro Abs: 4756 cells/uL (ref 1500–7800)
PLATELETS: 331 10*3/uL (ref 140–400)
RBC: 4.93 MIL/uL (ref 4.20–5.80)
RDW: 13.7 % (ref 11.0–15.0)
WBC: 8.2 10*3/uL (ref 3.8–10.8)

## 2016-09-15 MED ORDER — OMEPRAZOLE 20 MG PO CPDR: 20.0000 mg | DELAYED_RELEASE_CAPSULE | Freq: Every day | ORAL | 1 refills | Status: DC

## 2016-09-15 MED ORDER — QUETIAPINE FUMARATE 25 MG PO TABS: 25.0000 mg | ORAL_TABLET | Freq: Every day | ORAL | 1 refills | Status: DC

## 2016-09-15 NOTE — Progress Notes (Signed)
Name: Timothy Fleming   MRN: [id number]    DOB: April 15, 1985   Date:09/15/2016       Progress Note  Subjective  Chief Complaint  Chief Complaint  Patient presents with  . Annual Exam    HPI  Well male: married, sexually active, normal libido, no ED, no bladder problems, we will check labs, discussed life style modification  Major Depression: he was struggling throughout 2017, he felt much better Summer of 2018, however stress at work is going up and is still working on remodeling his house, weaned self off Duloxetine, he states Duloxetine causes nausea. Discussed trying another medication but he wants to hold off for now.   Insomnia: Doesn't like Belsomra as much as Ambien, but he does not want to use Ambien again; he tried Trazodone but made him foggy, Ambien caused memory issues. He is now on Seroquel and is working well for him, no side effects.   GERD:  He went off Omeprazole but states Ranitidine is not controlling symptoms, having to take it multiple times a day, recurrence of heartburn and regurgitation, has been waking up at night with symptoms. He is aware of long term risk of PPI but needs to resume medication.    Patient Active Problem List   Diagnosis Date Noted  . History of vasculitis of skin 05/20/2015  . Sprain of left wrist 11/30/2014  . GERD without esophagitis 11/10/2014  . Seasonal allergic rhinitis 11/10/2014  . History of nephrolithiasis 11/10/2014  . Obesity (BMI 30-39.9) 11/10/2014  . Tobacco abuse 11/10/2014  . Snoring 11/10/2014  . Insomnia 11/10/2014    Past Surgical History:  Procedure Laterality Date  . FACIAL RECONSTRUCTION SURGERY  01/13/2008   After a car fell on him while working on it    Family History  Problem Relation Age of Onset  . Varicose Veins Mother   . Endometriosis Mother   . Drug abuse Father     Social History   Social History  . Marital status: Married    Spouse name: N/A  . Number of children: N/A  . Years of  education: N/A   Occupational History  . Not on file.   Social History Main Topics  . Smoking status: Current Some Day Smoker    Packs/day: 0.25    Years: 15.00    Types: Cigarettes    Start date: 11/10/1999  . Smokeless tobacco: Never Used     Comment: e cig mostly  . Alcohol use No  . Drug use: No  . Sexual activity: Yes    Partners: Female   Other Topics Concern  . Not on file   Social History Narrative   He is married has one son.    He was addicted to video games, but is back playing guitar and going to the gym since 2018     Current Outpatient Prescriptions:  .  ammonium lactate (LAC-HYDRIN) 12 % cream, Apply topically as needed for dry skin., Disp: 385 g, Rfl: 0 .  EPINEPHrine (EPIPEN 2-PAK) 0.3 mg/0.3 mL IJ SOAJ injection, Inject 0.3 mLs (0.3 mg total) into the muscle once., Disp: 1 Device, Rfl: 1 .  QUEtiapine (SEROQUEL) 25 MG tablet, Take 1 tablet (25 mg total) by mouth at bedtime., Disp: 90 tablet, Rfl: 1 .  triamcinolone cream (KENALOG) 0.1 %, Apply 1 application topically 2 (two) times daily., Disp: 30 g, Rfl: 0 .  omeprazole (PRILOSEC) 20 MG capsule, Take 1 capsule (20 mg total) by mouth daily., Disp: 90  capsule, Rfl: 1  Allergies  Allergen Reactions  . Bee Venom Anaphylaxis, Shortness Of Breath and Swelling  . Penicillins Hives    vasculitis     ROS  Constitutional: Negative for fever, positive for  weight change.  Respiratory: Negative for cough and shortness of breath.   Cardiovascular: Negative for chest pain or palpitations.  Gastrointestinal: Negative for abdominal pain, no bowel changes.  Musculoskeletal: Negative for gait problem or joint swelling.  Skin: Negative for rash.  Neurological: Negative for dizziness or headache.  No other specific complaints in a complete review of systems (except as listed in HPI above).  Objective  Vitals:   09/15/16 1051  BP: 112/66  Pulse: 72  Resp: 16  Temp: 98.6 F (37 C)  TempSrc: Oral  SpO2: 98%   Weight: 250 lb 12.8 oz (113.8 kg)  Height: 6' (1.829 m)    Body mass index is 34.01 kg/m.  Physical Exam  Constitutional: Patient appears well-developed and obese.  No distress.  HENT: Head: Normocephalic and atraumatic. Ears: B TMs ok, no erythema or effusion; Nose: Nose normal. Mouth/Throat: Oropharynx is clear and moist. No oropharyngeal exudate.  Eyes: Conjunctivae and EOM are normal. Pupils are equal, round, and reactive to light. No scleral icterus.  Neck: Normal range of motion. Neck supple. No JVD present. No thyromegaly present.  Cardiovascular: Normal rate, regular rhythm and normal heart sounds.  No murmur heard. No BLE edema. Pulmonary/Chest: Effort normal and breath sounds normal. No respiratory distress. Abdominal: Soft. Bowel sounds are normal, no distension. There is no tenderness. no masses MALE GENITALIA: Normal descended testes bilaterally, no masses palpated, no hernias, no lesions, no discharge RECTAL: not done Musculoskeletal: Normal range of motion, no joint effusions. No gross deformities Neurological: he is alert and oriented to person, place, and time. No cranial nerve deficit. Coordination, balance, strength, speech and gait are normal.  Skin: Skin is warm and dry. No rash noted. No erythema.  Psychiatric: Patient has a normal mood and affect. behavior is normal. Judgment and thought content normal.  PHQ2/9: Depression screen [facility] 2/9 09/15/2016 07/19/2016 04/18/2016 07/08/2015 05/20/2015  Decreased Interest 3 0 0 0 0  Down, Depressed, Hopeless 2 0 0 0 1  PHQ - 2 Score 5 0 0 0 1  Altered sleeping 0 - - - -  Tired, decreased energy 3 - - - -  Change in appetite 2 - - - -  Feeling bad or failure about yourself  1 - - - -  Trouble concentrating 0 - - - -  Moving slowly or fidgety/restless 1 - - - -  Suicidal thoughts 0 - - - -  PHQ-9 Score 12 - - - -  Difficult doing work/chores Somewhat difficult - - - -     Fall Risk: Fall Risk  09/15/2016 07/19/2016  04/18/2016 07/08/2015 05/20/2015  Falls in the past year? Yes No No No No  Number falls in past yr: 1 - - - -  Injury with Fall? No - - - -     Assessment & Plan  1. Well adult exam  Discussed importance of 150 minutes of physical activity weekly, eat two servings of fish weekly, eat one serving of tree nuts ( cashews, pistachios, pecans, almonds.Marland Kitchen) every other day, eat 6 servings of fruit/vegetables daily and drink plenty of water and avoid sweet beverages.  - Hemoglobin A1c - Lipid panel - COMPLETE METABOLIC PANEL WITH GFR - CBC with Differential/Platelet - TSH  2. Other insomnia  -  QUEtiapine (SEROQUEL) 25 MG tablet; Take 1 tablet (25 mg total) by mouth at bedtime.  Dispense: 90 tablet; Refill: 1  3. GERD without esophagitis  - omeprazole (PRILOSEC) 20 MG capsule; Take 1 capsule (20 mg total) by mouth daily.  Dispense: 90 capsule; Refill: 1  4. Major depression mild (HCC)  Weaned self off Cymbalta, PHQ9 went up, explained he is not in remission and discuss going on a different medication but he wants to come off all medications  5. Need for influenza vaccination  - Flu Vaccine QUAD 36+ mos IM  6. Lipid screening  - Lipid panel  7. Diabetes mellitus screening  - Hemoglobin A1c - Insulin, fasting

## 2016-09-15 NOTE — Patient Instructions (Signed)
Preventive Care 18-39 Years, Male Preventive care refers to lifestyle choices and visits with your health care provider that can promote health and wellness. What does preventive care include?  A yearly physical exam. This is also called an annual well check.  Dental exams once or twice a year.  Routine eye exams. Ask your health care provider how often you should have your eyes checked.  Personal lifestyle choices, including: ? Daily care of your teeth and gums. ? Regular physical activity. ? Eating a healthy diet. ? Avoiding tobacco and drug use. ? Limiting alcohol use. ? Practicing safe sex. What happens during an annual well check? The services and screenings done by your health care provider during your annual well check will depend on your age, overall health, lifestyle risk factors, and family history of disease. Counseling Your health care provider may ask you questions about your:  Alcohol use.  Tobacco use.  Drug use.  Emotional well-being.  Home and relationship well-being.  Sexual activity.  Eating habits.  Work and work environment.  Screening You may have the following tests or measurements:  Height, weight, and BMI.  Blood pressure.  Lipid and cholesterol levels. These may be checked every 5 years starting at age 20.  Diabetes screening. This is done by checking your blood sugar (glucose) after you have not eaten for a while (fasting).  Skin check.  Hepatitis C blood test.  Hepatitis B blood test.  Sexually transmitted disease (STD) testing.  Discuss your test results, treatment options, and if necessary, the need for more tests with your health care provider. Vaccines Your health care provider may recommend certain vaccines, such as:  Influenza vaccine. This is recommended every year.  Tetanus, diphtheria, and acellular pertussis (Tdap, Td) vaccine. You may need a Td booster every 10 years.  Varicella vaccine. You may need this if you  have not been vaccinated.  HPV vaccine. If you are 26 or younger, you may need three doses over 6 months.  Measles, mumps, and rubella (MMR) vaccine. You may need at least one dose of MMR.You may also need a second dose.  Pneumococcal 13-valent conjugate (PCV13) vaccine. You may need this if you have certain conditions and have not been vaccinated.  Pneumococcal polysaccharide (PPSV23) vaccine. You may need one or two doses if you smoke cigarettes or if you have certain conditions.  Meningococcal vaccine. One dose is recommended if you are age 19-21 years and a first-year college student living in a residence hall, or if you have one of several medical conditions. You may also need additional booster doses.  Hepatitis A vaccine. You may need this if you have certain conditions or if you travel or work in places where you may be exposed to hepatitis A.  Hepatitis B vaccine. You may need this if you have certain conditions or if you travel or work in places where you may be exposed to hepatitis B.  Haemophilus influenzae type b (Hib) vaccine. You may need this if you have certain risk factors.  Talk to your health care provider about which screenings and vaccines you need and how often you need them. This information is not intended to replace advice given to you by your health care provider. Make sure you discuss any questions you have with your health care provider. Document Released: 03/07/2001 Document Revised: 09/29/2015 Document Reviewed: 11/10/2014 Elsevier Interactive Patient Education  2017 Elsevier Inc.  

## 2016-09-16 LAB — COMPLETE METABOLIC PANEL WITH GFR
ALBUMIN: 4.6 g/dL (ref 3.6–5.1)
ALK PHOS: 65 U/L (ref 40–115)
ALT: 17 U/L (ref 9–46)
AST: 15 U/L (ref 10–40)
BILIRUBIN TOTAL: 0.5 mg/dL (ref 0.2–1.2)
BUN: 13 mg/dL (ref 7–25)
CALCIUM: 9.5 mg/dL (ref 8.6–10.3)
CO2: 26 mmol/L (ref 20–32)
Chloride: 105 mmol/L (ref 98–110)
Creat: 0.92 mg/dL (ref 0.60–1.35)
GFR, Est African American: >89 mL/min (ref >=60)
GLUCOSE: 87 mg/dL (ref 65–99)
Potassium: 4.7 mmol/L (ref 3.5–5.3)
SODIUM: 138 mmol/L (ref 135–146)
TOTAL PROTEIN: 7.2 g/dL (ref 6.1–8.1)

## 2016-09-16 LAB — LIPID PANEL
CHOL/HDL RATIO: 4.3 ratio (ref <5.0)
CHOLESTEROL: 160 mg/dL (ref <200)
HDL: 37 mg/dL — AB (ref >40)
LDL Cholesterol: 97 mg/dL (ref <100)
Triglycerides: 132 mg/dL (ref <150)
VLDL: 26 mg/dL (ref <30)

## 2016-09-16 LAB — HEMOGLOBIN A1C
Hgb A1c MFr Bld: 5.3 %
Mean Plasma Glucose: 105 mg/dL

## 2016-09-16 LAB — TSH: TSH: 1.33 m[IU]/L (ref 0.40–4.50)

## 2016-09-18 LAB — INSULIN, FASTING: INSULIN FASTING, SERUM: 14.6 u[IU]/mL (ref 2.0–19.6)

## 2016-09-28 ENCOUNTER — Other Ambulatory Visit: Payer: Self-pay

## 2016-10-17 ENCOUNTER — Encounter: Payer: Self-pay | Admitting: Emergency Medicine

## 2016-10-17 ENCOUNTER — Ambulatory Visit: Payer: 59 | Admitting: Family Medicine

## 2016-10-17 ENCOUNTER — Ambulatory Visit
Admission: EM | Admit: 2016-10-17 | Discharge: 2016-10-17 | Disposition: A | Discharge status: Home / Self Care | Payer: 59 | Attending: Emergency Medicine | Admitting: Emergency Medicine

## 2016-10-17 DIAGNOSIS — K529 Noninfective gastroenteritis and colitis, unspecified: Secondary | ICD-10-CM

## 2016-10-17 DIAGNOSIS — R197 Diarrhea, unspecified: Secondary | ICD-10-CM | POA: Diagnosis not present

## 2016-10-17 DIAGNOSIS — R112 Nausea with vomiting, unspecified: Secondary | ICD-10-CM

## 2016-10-17 LAB — CBC WITH DIFFERENTIAL/PLATELET
BASOS ABS: 0.1 10*3/uL (ref 0–0.1)
Basophils Relative: 0 %
Eosinophils Absolute: 0 10*3/uL (ref 0–0.7)
Eosinophils Relative: 0 %
HCT: 42.8 % (ref 40.0–52.0)
HEMOGLOBIN: 14.6 g/dL (ref 13.0–18.0)
LYMPHS ABS: 1.1 10*3/uL (ref 1.0–3.6)
LYMPHS PCT: 8 %
MCH: 29.8 pg (ref 26.0–34.0)
MCHC: 34.2 g/dL (ref 32.0–36.0)
MCV: 87 fL (ref 80.0–100.0)
Monocytes Absolute: 1.3 10*3/uL — ABNORMAL HIGH (ref 0.2–1.0)
Monocytes Relative: 9 %
NEUTROS PCT: 83 %
Neutro Abs: 11.2 10*3/uL — ABNORMAL HIGH (ref 1.4–6.5)
Platelets: 281 10*3/uL (ref 150–440)
RBC: 4.92 MIL/uL (ref 4.40–5.90)
RDW: 13 % (ref 11.5–14.5)
WBC: 13.6 10*3/uL — AB (ref 3.8–10.6)

## 2016-10-17 LAB — URINALYSIS, COMPLETE (UACMP) WITH MICROSCOPIC
BILIRUBIN URINE: NEGATIVE
Bacteria, UA: NONE SEEN
Glucose, UA: NEGATIVE mg/dL
HGB URINE DIPSTICK: NEGATIVE
KETONES UR: NEGATIVE mg/dL
LEUKOCYTES UA: NEGATIVE
Nitrite: NEGATIVE
RBC / HPF: NONE SEEN RBC/hpf (ref 0–5)
SQUAMOUS EPITHELIAL / LPF: NONE SEEN
Specific Gravity, Urine: 1.025 (ref 1.005–1.030)
pH: 6 (ref 5.0–8.0)

## 2016-10-17 LAB — COMPREHENSIVE METABOLIC PANEL
ALT: 18 U/L (ref 17–63)
AST: 21 U/L (ref 15–41)
Albumin: 4.4 g/dL (ref 3.5–5.0)
Alkaline Phosphatase: 66 U/L (ref 38–126)
Anion gap: 10 (ref 5–15)
BILIRUBIN TOTAL: 1.2 mg/dL (ref 0.3–1.2)
BUN: 12 mg/dL (ref 6–20)
CHLORIDE: 100 mmol/L — AB (ref 101–111)
CO2: 26 mmol/L (ref 22–32)
Calcium: 8.9 mg/dL (ref 8.9–10.3)
Creatinine, Ser: 1.18 mg/dL (ref 0.61–1.24)
Glucose, Bld: 101 mg/dL — ABNORMAL HIGH (ref 65–99)
POTASSIUM: 3.5 mmol/L (ref 3.5–5.1)
Sodium: 136 mmol/L (ref 135–145)
TOTAL PROTEIN: 8 g/dL (ref 6.5–8.1)

## 2016-10-17 LAB — LIPASE, BLOOD: LIPASE: 22 U/L (ref 11–51)

## 2016-10-17 MED ORDER — IBUPROFEN 600 MG PO TABS: 600.0000 mg | ORAL_TABLET | Freq: Four times a day (QID) | ORAL | 0 refills | Status: DC | PRN

## 2016-10-17 MED ORDER — ONDANSETRON 8 MG PO TBDP: 8.0000 mg | ORAL_TABLET | Freq: Three times a day (TID) | ORAL | 0 refills | Status: DC | PRN

## 2016-10-17 MED ORDER — ACETAMINOPHEN 500 MG PO TABS
1000.0000 mg | ORAL_TABLET | Freq: Once | ORAL | Status: AC
  Administered 2016-10-17: 1000 mg via ORAL

## 2016-10-17 MED ORDER — ONDANSETRON HCL 4 MG/2ML IJ SOLN
4.0000 mg | Freq: Once | INTRAMUSCULAR | Status: AC
  Administered 2016-10-17: 4 mg via INTRAVENOUS

## 2016-10-17 MED ORDER — DIPHENOXYLATE-ATROPINE 2.5-0.025 MG PO TABS: 1.0000 | ORAL_TABLET | Freq: Four times a day (QID) | ORAL | 0 refills | Status: DC | PRN

## 2016-10-17 MED ORDER — SODIUM CHLORIDE 0.9 % IV BOLUS (SEPSIS)
1000.0000 mL | Freq: Once | INTRAVENOUS | Status: AC
  Administered 2016-10-17: 1000 mL via INTRAVENOUS

## 2016-10-17 NOTE — ED Triage Notes (Signed)
Patient c/o nausea, vomiting and diarrhea since 8pm last night. Patient is also having body/joint aches. Patient has had chills but no fever. Patient has tried Pepto Bismol without relief.

## 2016-10-17 NOTE — Discharge Instructions (Signed)
Continue pushing electrolyte containing fluids until your urine is clear. Take 1 g of Tylenol with 600 mg ibuprofen 3-4 times a day as needed for pain and fever. Lomotil will slow down the diarrhea .Zofran will help with the nausea and we'll also make you constipated so it will slow the diarrhea down as well.

## 2016-10-17 NOTE — ED Provider Notes (Signed)
HPI  SUBJECTIVE:  Timothy Fleming is a 31 y.o. male who presents with more than 15 episodes of nonbilious, nonbloody emesis and nonbloody, watery diarrhea starting at 2000 last night. States that he has been vomiting and having diarrhea every 30 min-hour. States that the vomiting, diarrhea is slowing down but now reports generalized weakness, body aches, slight headache, mild photophobia. Reports crampy abdominal pain before vomiting and diarrhea which gets better afterwards. He denies any other abdominal pain. No distension.  No fevers at home, neck stiffness.. No nasal congestion, sinus pain or pressure, ear pain. No coughing, wheezing, chest pain, shortness of breath. Patient does report some dysuria, but no urgency, frequency, cloudy or odorous urine, hematuria or change in urine output. He reports bilateral achy sore constant nonradiating low back pain that he thinks is from vomiting. States that he is hungry. He tried Pepto-Bismol but vomited this up. No alleviating factors. Symptoms are worse with drinking soda. No associated with movement. No rash. No pain with movement. States the car ride over here was not painful. No raw or uncooked foods, questionable leftovers. He did have [employer] yesterday. No petting zoo exposure, exposure to reptiles, chickens, raw eggs. No travel, antibiotics. no penile rash, discharge, testicular swelling or pain. He is in a monogamous relationship with his wife. He has a past medical history of nonobstructing nephrolithiasis but states that his symptoms do not feel like this. 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 No history of abdominal surgeries, gallbladder disease, pancreatitis, ulcerative colitis, Crohn's, alcohol abuse, NSAID use, hypertension. NWG:[id number], Danna Hefty, MD     Past Medical History:  Diagnosis Date  . Allergy    Seasonal  . GERD (gastroesophageal reflux disease)   . History of kidney stones    x4     Past Surgical History:  Procedure Laterality Date  . FACIAL RECONSTRUCTION SURGERY  01/13/2008   After a car fell on him while working on it    Family History  Problem Relation Age of Onset  . Varicose Veins Mother   . Endometriosis Mother   . Drug abuse Father     Social History  Substance Use Topics  . Smoking status: Current Some Day Smoker    Packs/day: 0.25    Years: 15.00    Types: Cigarettes    Start date: 11/10/1999  . Smokeless tobacco: Never Used     Comment: e cig mostly  . Alcohol use No    No current facility-administered medications for this encounter.   Current Outpatient Prescriptions:  .  omeprazole (PRILOSEC) 20 MG capsule, Take 20 mg by mouth daily., Disp: , Rfl:  .  QUEtiapine (SEROQUEL) 25 MG tablet, Take 1 tablet (25 mg total) by mouth at bedtime., Disp: 90 tablet, Rfl: 1 .  ammonium lactate (LAC-HYDRIN) 12 % cream, Apply topically as needed for dry skin., Disp: 385 g, Rfl: 0 .  diphenoxylate-atropine (LOMOTIL) 2.5-0.025 MG tablet, Take 1 tablet by mouth 4 (four) times daily as needed for diarrhea or loose stools., Disp: 30 tablet, Rfl: 0 .  EPINEPHrine (EPIPEN 2-PAK) 0.3 mg/0.3 mL IJ SOAJ injection, Inject 0.3 mLs (0.3 mg total) into the muscle once., Disp: 1 Device, Rfl: 1 .  ibuprofen (ADVIL,MOTRIN) 600 MG tablet, Take 1 tablet (600 mg total) by mouth every 6 (six) hours as needed., Disp: 30 tablet, Rfl: 0 .  ondansetron (ZOFRAN ODT) 8 MG disintegrating tablet, Take 1 tablet (8 mg total) by mouth every 8 (eight) hours as  needed for nausea or vomiting., Disp: 20 tablet, Rfl: 0  Allergies  Allergen Reactions  . Bee Venom Anaphylaxis, Shortness Of Breath and Swelling  . Penicillins Hives    vasculitis     ROS  As noted in HPI.   Physical Exam  BP 125/60 (BP Location: Left Arm)   Pulse 100   Temp 99.5 F (37.5 C) (Oral)   Resp 16   Ht  (1.88 m)   Wt 255 lb (115.7 kg)   SpO2 97%   BMI 32.74 kg/m   Constitutional: Well  developed, well nourished, Appears ill Eyes: PERRL, EOMI, conjunctiva normal bilaterally HENT: Normocephalic, atraumatic,mucus membranes moist Respiratory: Clear to auscultation bilaterally, no rales, no wheezing, no rhonchi Neck: no meningismus Cardiovascular: Normal rate and rhythm, no murmurs, no gallops, no rubs GI: Soft, nondistended, normal bowel sounds, diffuse tenderness, no rebound, no guarding . negative Murphy, negative McBurney. Back: Left and right-sided paralumbar tenderness, questionable left CVA tenderness. No right CVA tenderness. skin: No rash, skin intact Musculoskeletal: No edema, no tenderness, no deformities Neurologic: Alert & oriented x 3, CN II-XII grossly intact, no motor deficits, sensation grossly intact Psychiatric: Speech and behavior appropriate   ED Course   Medications  sodium chloride 0.9 % bolus 1,000 mL (0 mLs Intravenous Stopped 10/17/16 1851)  acetaminophen (TYLENOL) tablet 1,000 mg (1,000 mg Oral Given 10/17/16 1744)  ondansetron (ZOFRAN) injection 4 mg (4 mg Intravenous Given 10/17/16 1730)    Orders Placed This Encounter  Procedures  . Lipase, blood    Standing Status:   Standing    Number of Occurrences:   1  . Comprehensive metabolic panel    Standing Status:   Standing    Number of Occurrences:   1  . CBC with Differential    Standing Status:   Standing    Number of Occurrences:   1  . Urinalysis, Complete w Microscopic    Standing Status:   Standing    Number of Occurrences:   1  . Enteric precautions (UV disinfection) C difficile, Norovirus    Standing Status:   Standing    Number of Occurrences:   1  . Insert peripheral IV    Standing Status:   Standing    Number of Occurrences:   1   Results for orders placed or performed during the hospital encounter of 10/17/16 (from the past 24 hour(s))  Lipase, blood     Status: None   Collection Time: 10/17/16  5:17 PM  Result Value Ref Range   Lipase 22 11 - 51 U/L  Comprehensive  metabolic panel     Status: Abnormal   Collection Time: 10/17/16  5:17 PM  Result Value Ref Range   Sodium 136 135 - 145 mmol/L   Potassium 3.5 3.5 - 5.1 mmol/L   Chloride 100 (L) 101 - 111 mmol/L   CO2 26 22 - 32 mmol/L   Glucose, Bld 101 (H) 65 - 99 mg/dL   BUN 12 6 - 20 mg/dL   Creatinine, Ser 1.61 0.61 - 1.24 mg/dL   Calcium 8.9 8.9 - 09.6 mg/dL   Total Protein 8.0 6.5 - 8.1 g/dL   Albumin 4.4 3.5 - 5.0 g/dL   AST 21 15 - 41 U/L   ALT 18 17 - 63 U/L   Alkaline Phosphatase 66 38 - 126 U/L   Total Bilirubin 1.2 0.3 - 1.2 mg/dL   GFR calc non Af Amer >60 >60 mL/min   GFR calc Af  Amer >60 >60 mL/min   Anion gap 10 5 - 15  CBC with Differential     Status: Abnormal   Collection Time: 10/17/16  5:17 PM  Result Value Ref Range   WBC 13.6 (H) 3.8 - 10.6 K/uL   RBC 4.92 4.40 - 5.90 MIL/uL   Hemoglobin 14.6 13.0 - 18.0 g/dL   HCT 40.9 81.1 - 91.4 %   MCV 87.0 80.0 - 100.0 fL   MCH 29.8 26.0 - 34.0 pg   MCHC 34.2 32.0 - 36.0 g/dL   RDW 78.2 95.6 - 21.3 %   Platelets 281 150 - 440 K/uL   Neutrophils Relative % 83 %   Neutro Abs 11.2 (H) 1.4 - 6.5 K/uL   Lymphocytes Relative 8 %   Lymphs Abs 1.1 1.0 - 3.6 K/uL   Monocytes Relative 9 %   Monocytes Absolute 1.3 (H) 0.2 - 1.0 K/uL   Eosinophils Relative 0 %   Eosinophils Absolute 0.0 0 - 0.7 K/uL   Basophils Relative 0 %   Basophils Absolute 0.1 0 - 0.1 K/uL  Urinalysis, Complete w Microscopic     Status: Abnormal   Collection Time: 10/17/16  5:17 PM  Result Value Ref Range   Color, Urine YELLOW YELLOW   APPearance CLEAR CLEAR   Specific Gravity, Urine 1.025 1.005 - 1.030   pH 6.0 5.0 - 8.0   Glucose, UA NEGATIVE NEGATIVE mg/dL   Hgb urine dipstick NEGATIVE NEGATIVE   Bilirubin Urine NEGATIVE NEGATIVE   Ketones, ur NEGATIVE NEGATIVE mg/dL   Protein, ur TRACE (A) NEGATIVE mg/dL   Nitrite NEGATIVE NEGATIVE   Leukocytes, UA NEGATIVE NEGATIVE   Squamous Epithelial / LPF NONE SEEN NONE SEEN   WBC, UA 0-5 0 - 5 WBC/hpf   RBC /  HPF NONE SEEN 0 - 5 RBC/hpf   Bacteria, UA NONE SEEN NONE SEEN   Mucus PRESENT    No results found.  ED Clinical Impression  Gastroenteritis   ED Assessment/Plan  Patient appears ill but he has no evidence of a surgical abdomen. He is febrile To 101.2 so will give 1000 mg of Tylenol by mouth to see if he tolerates this. Also 4 mg of Zofran IV, 1 L normal saline, will check CBC, CMP, lipase and UA. Presentation is consistent with a viral gastroenteritis although bacterial gastroenteritis is in the differential. We'll check for UA given his CVA tenderness however he also does have muscular tenderness on the side and states that he has is sore from the emesis and diarrhea. We'll send off a stool sample if he is able to provide one, but discussed with him this is not completely necessary since this is been present less than 24 hours and seems to be slowing down.   Labs reviewed. No UTI. minimal leukocytosis, normal CMP, lipase.   1830-on reevaluation, patient states that he feels significantly better. Afebrile repeat temperature. VSS.  Bodyaches resolving with the Tylenol. He appears improved. He has drunk about a liter of Pedialyte in addition to getting a liter of IV fluids. He has no CVA tenderness on repeat exam but bilateral paralumbar tenderness so think that the back pain is less from a UTI and more from vomiting.  We will not send off stool testing as patient was unable to give [place] a sample here, and it has been less than 24 hours of the onset of the illness. This is most likely viral versus food poisoning from the restaurant.Maryclare Labrador send home with  Zofran, Lomotil, 600 mg of ibuprofen combined with 1 g of Tylenol 3-4 times a day as needed for fever, body aches. push electrolyte containing fluids. Patient to follow-up with his PMD as needed, to the ER if he gets worse.  Discussed labs, MDM, plan and followup with patient. Discussed sn/sx that should prompt return to the ED. Patient agrees  with plan.   Meds ordered this encounter  Medications  . omeprazole (PRILOSEC) 20 MG capsule    Sig: Take 20 mg by mouth daily.  . sodium chloride 0.9 % bolus 1,000 mL  . acetaminophen (TYLENOL) tablet 1,000 mg  . ondansetron (ZOFRAN) injection 4 mg  . ibuprofen (ADVIL,MOTRIN) 600 MG tablet    Sig: Take 1 tablet (600 mg total) by mouth every 6 (six) hours as needed.    Dispense:  30 tablet    Refill:  0  . diphenoxylate-atropine (LOMOTIL) 2.5-0.025 MG tablet    Sig: Take 1 tablet by mouth 4 (four) times daily as needed for diarrhea or loose stools.    Dispense:  30 tablet    Refill:  0  . ondansetron (ZOFRAN ODT) 8 MG disintegrating tablet    Sig: Take 1 tablet (8 mg total) by mouth every 8 (eight) hours as needed for nausea or vomiting.    Dispense:  20 tablet    Refill:  0    *This clinic note was created using Scientist, clinical (histocompatibility and immunogenetics). Therefore, there may be occasional mistakes despite careful proofreading.  ?   Domenick Gong, MD 10/17/16 2132

## 2016-11-30 ENCOUNTER — Ambulatory Visit: Payer: 59 | Admitting: Family Medicine

## 2016-11-30 ENCOUNTER — Encounter: Payer: Self-pay | Admitting: Family Medicine

## 2016-11-30 VITALS — BP 124/86 | HR 99 | Temp 98.3°F | Resp 18 | Ht 74.0 in | Wt 260.9 lb

## 2016-11-30 DIAGNOSIS — R197 Diarrhea, unspecified: Secondary | ICD-10-CM

## 2016-11-30 DIAGNOSIS — F321 Major depressive disorder, single episode, moderate: Secondary | ICD-10-CM | POA: Diagnosis not present

## 2016-11-30 DIAGNOSIS — R1031 Right lower quadrant pain: Secondary | ICD-10-CM

## 2016-11-30 DIAGNOSIS — R69 Illness, unspecified: Secondary | ICD-10-CM | POA: Diagnosis not present

## 2016-11-30 LAB — CBC
HCT: 43.7 % (ref 38.5–50.0)
Hemoglobin: 14.7 g/dL (ref 13.2–17.1)
MCH: 29 pg (ref 27.0–33.0)
MCHC: 33.6 g/dL (ref 32.0–36.0)
MCV: 86.2 fL (ref 80.0–100.0)
MPV: 10.1 fL (ref 7.5–12.5)
PLATELETS: 344 10*3/uL (ref 140–400)
RBC: 5.07 10*6/uL (ref 4.20–5.80)
RDW: 12.4 % (ref 11.0–15.0)
WBC: 10.7 10*3/uL (ref 3.8–10.8)

## 2016-11-30 LAB — POCT URINALYSIS DIPSTICK
BILIRUBIN UA: NEGATIVE
Glucose, UA: NEGATIVE
KETONES UA: NEGATIVE
Leukocytes, UA: NEGATIVE
Nitrite, UA: NEGATIVE
PH UA: 5 (ref 5.0–8.0)
Protein, UA: NEGATIVE
RBC UA: NEGATIVE
SPEC GRAV UA: 1.01 (ref 1.010–1.025)
Urobilinogen, UA: 0.2 E.U./dL

## 2016-11-30 LAB — BASIC METABOLIC PANEL WITH GFR
BUN: 10 mg/dL (ref 7–25)
CHLORIDE: 103 mmol/L (ref 98–110)
CO2: 27 mmol/L (ref 20–32)
Calcium: 9.6 mg/dL (ref 8.6–10.3)
Creat: 1.03 mg/dL (ref 0.60–1.35)
GFR, EST AFRICAN AMERICAN: 112 mL/min/{1.73_m2} (ref >=60)
GFR, Est Non African American: 96 mL/min/{1.73_m2} (ref >=60)
Glucose, Bld: 88 mg/dL (ref 65–139)
POTASSIUM: 4 mmol/L (ref 3.5–5.3)
Sodium: 138 mmol/L (ref 135–146)

## 2016-11-30 MED ORDER — ESCITALOPRAM OXALATE 20 MG PO TABS: 20.0000 mg | ORAL_TABLET | Freq: Every day | ORAL | 1 refills | Status: DC

## 2016-11-30 NOTE — Patient Instructions (Addendum)
Food Choices to Help Relieve Diarrhea, Adult When you have diarrhea, the foods you eat and your eating habits are very important. Choosing the right foods and drinks can help:  Relieve diarrhea.  Replace lost fluids and nutrients.  Prevent dehydration.  What general guidelines should I follow? Relieving diarrhea  Choose foods with less than 2 g or .07 oz. of fiber per serving.  Limit fats to less than 8 tsp (38 g or 1.34 oz.) a day.  Avoid the following: ? Foods and beverages sweetened with high-fructose corn syrup, honey, or sugar alcohols such as xylitol, sorbitol, and mannitol. ? Foods that contain a lot of fat or sugar. ? Fried, greasy, or spicy foods. ? High-fiber grains, breads, and cereals. ? Raw fruits and vegetables.  Eat foods that are rich in probiotics. These foods include dairy products such as yogurt and fermented milk products. They help increase healthy bacteria in the stomach and intestines (gastrointestinal tract, or GI tract).  If you have lactose intolerance, avoid dairy products. These may make your diarrhea worse.  Take medicine to help stop diarrhea (antidiarrheal medicine) only as told by your health care provider. Replacing nutrients  Eat small meals or snacks every 3-4 hours.  Eat bland foods, such as white rice, toast, or baked potato, until your diarrhea starts to get better. Gradually reintroduce nutrient-rich foods as tolerated or as told by your health care provider. This includes: ? Well-cooked protein foods. ? Peeled, seeded, and soft-cooked fruits and vegetables. ? Low-fat dairy products.  Take vitamin and mineral supplements as told by your health care provider. Preventing dehydration   Start by sipping water or a special solution to prevent dehydration (oral rehydration solution, ORS). Urine that is clear or pale yellow means that you are getting enough fluid.  Try to drink at least 8-10 cups of fluid each day to help replace lost  fluids.  You may add other liquids in addition to water, such as clear juice or decaffeinated sports drinks, as tolerated or as told by your health care provider.  Avoid drinks with caffeine, such as coffee, tea, or soft drinks.  Avoid alcohol. What foods are recommended? The items listed may not be a complete list. Talk with your health care provider about what dietary choices are best for you. Grains White rice. White, French, or pita breads (fresh or toasted), including plain rolls, buns, or bagels. White pasta. Saltine, soda, or graham crackers. Pretzels. Low-fiber cereal. Cooked cereals made with water (such as cornmeal, farina, or cream cereals). Plain muffins. Matzo. Melba toast. Zwieback. Vegetables Potatoes (without the skin). Most well-cooked and canned vegetables without skins or seeds. Tender lettuce. Fruits Apple sauce. Fruits canned in juice. Cooked apricots, cherries, grapefruit, peaches, pears, or plums. Fresh bananas and cantaloupe. Meats and other protein foods Baked or boiled chicken. Eggs. Tofu. Fish. Seafood. Smooth nut butters. Ground or well-cooked tender beef, ham, veal, lamb, pork, or poultry. Dairy Plain yogurt, kefir, and unsweetened liquid yogurt. Lactose-free milk, buttermilk, skim milk, or soy milk. Low-fat or nonfat hard cheese. Beverages Water. Low-calorie sports drinks. Fruit juices without pulp. Strained tomato and vegetable juices. Decaffeinated teas. Sugar-free beverages not sweetened with sugar alcohols. Oral rehydration solutions, if approved by your health care provider. Seasoning and other foods Bouillon, broth, or soups made from recommended foods. What foods are not recommended? The items listed may not be a complete list. Talk with your health care provider about what dietary choices are best for you. Grains Whole grain, whole wheat,   bran, or rye breads, rolls, pastas, and crackers. Wild or brown rice. Whole grain or bran cereals. Barley. Oats and  oatmeal. Corn tortillas or taco shells. Granola. Popcorn. Vegetables Raw vegetables. Fried vegetables. Cabbage, broccoli, Brussels sprouts, artichokes, baked beans, beet greens, corn, kale, legumes, peas, sweet potatoes, and yams. Potato skins. Cooked spinach and cabbage. Fruits Dried fruit, including raisins and dates. Raw fruits. Stewed or dried prunes. Canned fruits with syrup. Meat and other protein foods Fried or fatty meats. Deli meats. Chunky nut butters. Nuts and seeds. Beans and lentils. Tomasa BlaseBacon. Hot dogs. Sausage. Dairy High-fat cheeses. Whole milk, chocolate milk, and beverages made with milk, such as milk shakes. Half-and-half. Cream. sour cream. Ice cream. Beverages Caffeinated beverages (such as coffee, tea, soda, or energy drinks). Alcoholic beverages. Fruit juices with pulp. Prune juice. Soft drinks sweetened with high-fructose corn syrup or sugar alcohols. High-calorie sports drinks. Fats and oils Butter. Cream sauces. Margarine. Salad oils. Plain salad dressings. Olives. Avocados. Mayonnaise. Sweets and desserts Sweet rolls, doughnuts, and sweet breads. Sugar-free desserts sweetened with sugar alcohols such as xylitol and sorbitol. Seasoning and other foods Honey. Hot sauce. Chili powder. Gravy. Cream-based or milk-based soups. Pancakes and waffles. Summary  When you have diarrhea, the foods you eat and your eating habits are very important.  Make sure you get at least 8-10 cups of fluid each day, or enough to keep your urine clear or pale yellow.  Eat bland foods and gradually reintroduce healthy, nutrient-rich foods as tolerated, or as told by your health care provider.  Avoid high-fiber, fried, greasy, or spicy foods. This information is not intended to replace advice given to you by your health care provider. Make sure you discuss any questions you have with your health care provider. Document Released: 04/01/2003 Document Revised: 01/07/2016 Document Reviewed:  01/07/2016 Elsevier Interactive Patient Education  2017 Elsevier Inc.  Major Depressive Disorder, Adult Major depressive disorder (MDD) is a mental health condition. MDD often makes you feel sad, hopeless, or helpless. MDD can also cause symptoms in your body. MDD can affect your:  Work.  School.  Relationships.  Other normal activities.  MDD can range from mild to very bad. It may occur once (single episode MDD). It can also occur many times (recurrent MDD). The main symptoms of MDD often include:  Feeling sad, depressed, or irritable most of the time.  Loss of interest.  MDD symptoms also include:  Sleeping too much or too little.  Eating too much or too little.  A change in your weight.  Feeling tired (fatigue) or having low energy.  Feeling worthless.  Feeling guilty.  Trouble making decisions.  Trouble thinking clearly.  Thoughts of suicide or harming others.  Feeling weak.  Feeling agitated.  Keeping yourself from being around other people (isolation).  Follow these instructions at home: Activity  Do these things as told by your doctor: ? Go back to your normal activities. ? Exercise regularly. ? Spend time outdoors. Alcohol  Talk with your doctor about how alcohol can affect your antidepressant medicines.  Do not drink alcohol. Or, limit how much alcohol you drink. ? This means no more than 1 drink a day for nonpregnant women and 2 drinks a day for men. One drink equals one of these:  12 oz of beer.  5 oz of wine.  1 oz of hard liquor. General instructions  Take over-the-counter and prescription medicines only as told by your doctor.  Eat a healthy diet.  Get plenty of sleep.  Find activities that you enjoy. Make time to do them.  Think about joining a support group. Your doctor may be able to suggest a group for you.  Keep all follow-up visits as told by your doctor. This is important. Where to find more information:  [employer]  Alliance on Mental Illness: ? www.nami.org  [employer] of Mental Health: ? http://www.maynard.net/www.nimh.nih.gov  National Suicide Prevention Lifeline: ? 939-548-65971-(215)733-6783. This is free, 24-hour help. Contact a doctor if:  Your symptoms get worse.  You have new symptoms. Get help right away if:  You self-harm.  You see, hear, taste, smell, or feel things that are not present (hallucinate). If you ever feel like you may hurt yourself or others, or have thoughts about taking your own life, get help right away. You can go to your nearest emergency department or call:  Your local emergency services (911 in the [place]  A suicide crisis helpline, such as the National Suicide Prevention Lifeline: ? 641-178-16881-(215)733-6783. This is open 24 hours a day.  This information is not intended to replace advice given to you by your health care provider. Make sure you discuss any questions you have with your health care provider. Document Released: 12/21/2014 Document Revised: 09/26/2015 Document Reviewed: 09/26/2015 Elsevier Interactive Patient Education  2017 [employer]

## 2016-11-30 NOTE — Progress Notes (Signed)
Name: Timothy Fleming   MRN: [id number]    DOB: 01/18/1986   Date:11/30/2016       Progress Note  Subjective  Chief Complaint  Chief Complaint  Patient presents with  . Abdominal Pain    since yesterday  . Urinary Frequency    going up to 5 times a day    HPI  Patient presents with complaint of generalized abdominal pain, bloating, diarrhea x1 day. Denies dysuria or urinary frequency.  UA today is negative.  Denies blood in stool or dark stools, no mucus or watery stools, no nausea or vomiting. States he needs a work note for today.  Depression: Requests to be placed back on antidepressant medications because work has been extremely stressful lately - he is working on resolving these issues but notes depressed mood.  Has taken Cymbalta and Wellbutrin in the past; states he has taken other antidepressants too but can't remember their names. He weaned himself off of Cymbalta in March 2018, and says he did well for a little while but has noticed increased anxiety and depressed mood.  Denies SI/HI  Depression screen [facility] 2/9 11/30/2016 09/15/2016 07/19/2016 04/18/2016 07/08/2015  Decreased Interest 3 3 0 0 0  Down, Depressed, Hopeless 3 2 0 0 0  PHQ - 2 Score 6 5 0 0 0  Altered sleeping 3 0 - - -  Tired, decreased energy 2 3 - - -  Change in appetite 3 2 - - -  Feeling bad or failure about yourself  3 1 - - -  Trouble concentrating 2 0 - - -  Moving slowly or fidgety/restless 2 1 - - -  Suicidal thoughts 0 0 - - -  PHQ-9 Score 21 12 - - -  Difficult doing work/chores Very difficult Somewhat difficult - - -   Patient Active Problem List   Diagnosis Date Noted  . History of vasculitis of skin 05/20/2015  . Sprain of left wrist 11/30/2014  . GERD without esophagitis 11/10/2014  . Seasonal allergic rhinitis 11/10/2014  . History of nephrolithiasis 11/10/2014  . Obesity (BMI 30-39.9) 11/10/2014  . Tobacco abuse 11/10/2014  . Snoring 11/10/2014  . Insomnia 11/10/2014    Social History    Tobacco Use  . Smoking status: Current Some Day Smoker    Packs/day: 0.25    Years: 15.00    Pack years: 3.75    Types: Cigarettes    Start date: 11/10/1999  . Smokeless tobacco: Never Used  . Tobacco comment: e cig mostly  Substance Use Topics  . Alcohol use: No    Alcohol/week: 0.0 oz     Current Outpatient Medications:  .  EPINEPHrine (EPIPEN 2-PAK) 0.3 mg/0.3 mL IJ SOAJ injection, Inject 0.3 mLs (0.3 mg total) into the muscle once., Disp: 1 Device, Rfl: 1 .  omeprazole (PRILOSEC) 20 MG capsule, Take 20 mg by mouth daily., Disp: , Rfl:  .  QUEtiapine (SEROQUEL) 25 MG tablet, Take 1 tablet (25 mg total) by mouth at bedtime., Disp: 90 tablet, Rfl: 1 .  ammonium lactate (LAC-HYDRIN) 12 % cream, Apply topically as needed for dry skin. (Patient not taking: Reported on 11/30/2016), Disp: 385 g, Rfl: 0 .  diphenoxylate-atropine (LOMOTIL) 2.5-0.025 MG tablet, Take 1 tablet by mouth 4 (four) times daily as needed for diarrhea or loose stools. (Patient not taking: Reported on 11/30/2016), Disp: 30 tablet, Rfl: 0 .  ibuprofen (ADVIL,MOTRIN) 600 MG tablet, Take 1 tablet (600 mg total) by mouth every 6 (six) hours as  needed. (Patient not taking: Reported on 11/30/2016), Disp: 30 tablet, Rfl: 0 .  ondansetron (ZOFRAN ODT) 8 MG disintegrating tablet, Take 1 tablet (8 mg total) by mouth every 8 (eight) hours as needed for nausea or vomiting. (Patient not taking: Reported on 11/30/2016), Disp: 20 tablet, Rfl: 0  Allergies  Allergen Reactions  . Bee Venom Anaphylaxis, Shortness Of Breath and Swelling  . Penicillins Hives    vasculitis    ROS  Constitutional: Negative for fever or weight change.  Respiratory: Negative for cough and shortness of breath.   Cardiovascular: Negative for chest pain or palpitations.  Gastrointestinal: Negative for abdominal pain, no bowel changes.  Musculoskeletal: Negative for gait problem or joint swelling.  Skin: Negative for rash.  Neurological: Negative for  dizziness or headache.  No other specific complaints in a complete review of systems (except as listed in HPI above).  Objective  Vitals:   11/30/16 1523  BP: 124/86  Pulse: 99  Resp: 18  Temp: 98.3 F (36.8 C)  TempSrc: Oral  SpO2: 97%  Weight: 260 lb 14.4 oz (118.3 kg)  Height: _0  (1.88 m)   Body mass index is 33.5 kg/m.  Nursing Note and Vital Signs reviewed.  Physical Exam  Constitutional: Patient appears well-developed and well-nourished. Obese, No distress.  HEENT: head atraumatic, normocephalic.  Small erythematous, round, raised cyst consistent with folliculitis below left jaw - pt has short beard. Cardiovascular: Normal rate, regular rhythm, S1/S2 present.  No murmur or rub heard. No BLE edema. Pulmonary/Chest: Effort normal and breath sounds clear. No respiratory distress or retractions. Abdominal: Soft; RLQ tenderness (no rebound), bowel sounds present x4 quadrants.  Psychiatric: Patient has a normal mood and affect. behavior is normal. Judgment and thought content normal.  Recent Results (from the past 2160 hour(s))  Hemoglobin A1c     Status: None   Collection Time: 09/15/16 12:12 PM  Result Value Ref Range   Hgb A1c MFr Bld 5.3 <5.7 %    Comment:   For the purpose of screening for the presence of diabetes:   <5.7%       Consistent with the absence of diabetes 5.7-6.4 %   Consistent with increased risk for diabetes (prediabetes) >=6.5 %     Consistent with diabetes   This assay result is consistent with a decreased risk of diabetes.   Currently, no consensus exists regarding use of hemoglobin A1c for diagnosis of diabetes in children.   According to American Diabetes Association (ADA) guidelines, hemoglobin A1c <7.0% represents optimal control in non-pregnant diabetic patients. Different metrics may apply to specific patient populations. Standards of Medical Care in Diabetes (ADA).      Mean Plasma Glucose 105 mg/dL  Lipid panel     Status:  Abnormal   Collection Time: 09/15/16 12:12 PM  Result Value Ref Range   Cholesterol 160 <200 mg/dL   Triglycerides 132 <150 mg/dL   HDL 37 (L) >40 mg/dL   Total CHOL/HDL Ratio 4.3 <5.0 Ratio   VLDL 26 <30 mg/dL   LDL Cholesterol 97 <100 mg/dL  COMPLETE METABOLIC PANEL WITH GFR     Status: None   Collection Time: 09/15/16 12:12 PM  Result Value Ref Range   Sodium 138 135 - 146 mmol/L   Potassium 4.7 3.5 - 5.3 mmol/L   Chloride 105 98 - 110 mmol/L   CO2 26 20 - 32 mmol/L    Comment: ** Please note change in reference range(s). **      Glucose, Bld  87 65 - 99 mg/dL   BUN 13 7 - 25 mg/dL   Creat 0.92 0.60 - 1.35 mg/dL   Total Bilirubin 0.5 0.2 - 1.2 mg/dL   Alkaline Phosphatase 65 40 - 115 U/L   AST 15 10 - 40 U/L   ALT 17 9 - 46 U/L   Total Protein 7.2 6.1 - 8.1 g/dL   Albumin 4.6 3.6 - 5.1 g/dL   Calcium 9.5 8.6 - 10.3 mg/dL   GFR, Est African American >89 >=60 mL/min   GFR, Est Non African American >89 >=60 mL/min  CBC with Differential/Platelet     Status: None   Collection Time: 09/15/16 12:12 PM  Result Value Ref Range   WBC 8.2 3.8 - 10.8 K/uL   RBC 4.93 4.20 - 5.80 MIL/uL   Hemoglobin 14.9 13.2 - 17.1 g/dL   HCT 44.4 38.5 - 50.0 %   MCV 90.1 80.0 - 100.0 fL   MCH 30.2 27.0 - 33.0 pg   MCHC 33.6 32.0 - 36.0 g/dL   RDW 13.7 11.0 - 15.0 %   Platelets 331 140 - 400 K/uL   MPV 9.3 7.5 - 12.5 fL   Neutro Abs 4,756 1,500 - 7,800 cells/uL   Lymphs Abs 2,296 850 - 3,900 cells/uL   Monocytes Absolute 902 200 - 950 cells/uL   Eosinophils Absolute 246 15 - 500 cells/uL   Basophils Absolute 0 0 - 200 cells/uL   Neutrophils Relative % 58 %   Lymphocytes Relative 28 %   Monocytes Relative 11 %   Eosinophils Relative 3 %   Basophils Relative 0 %   Smear Review Criteria for review not met   TSH     Status: None   Collection Time: 09/15/16 12:12 PM  Result Value Ref Range   TSH 1.33 0.40 - 4.50 mIU/L  Insulin, fasting     Status: None   Collection Time: 09/15/16 12:12 PM   Result Value Ref Range   Insulin fasting, serum 14.6 2.0 - 19.6 uIU/mL    Comment:   This insulin assay shows strong cross-reactivity for some insulin analogs (lispro, aspart, and glargine) and much lower cross-reactivity with others (detemir, glulisine).   Stimulated Insulin reference intervals were established using the Siemens Immulite assay. These values are provided for general guidance only.   Lipase, blood     Status: None   Collection Time: 10/17/16  5:17 PM  Result Value Ref Range   Lipase 22 11 - 51 U/L  Comprehensive metabolic panel     Status: Abnormal   Collection Time: 10/17/16  5:17 PM  Result Value Ref Range   Sodium 136 135 - 145 mmol/L   Potassium 3.5 3.5 - 5.1 mmol/L   Chloride 100 (L) 101 - 111 mmol/L   CO2 26 22 - 32 mmol/L   Glucose, Bld 101 (H) 65 - 99 mg/dL   BUN 12 6 - 20 mg/dL   Creatinine, Ser 1.18 0.61 - 1.24 mg/dL   Calcium 8.9 8.9 - 10.3 mg/dL   Total Protein 8.0 6.5 - 8.1 g/dL   Albumin 4.4 3.5 - 5.0 g/dL   AST 21 15 - 41 U/L   ALT 18 17 - 63 U/L   Alkaline Phosphatase 66 38 - 126 U/L   Total Bilirubin 1.2 0.3 - 1.2 mg/dL   GFR calc non Af Amer >60 >60 mL/min   GFR calc Af Amer >60 >60 mL/min    Comment: (NOTE) The eGFR has been calculated using the  CKD EPI equation. This calculation has not been validated in all clinical situations. eGFR's persistently <60 mL/min signify possible Chronic Kidney Disease.    Anion gap 10 5 - 15  CBC with Differential     Status: Abnormal   Collection Time: 10/17/16  5:17 PM  Result Value Ref Range   WBC 13.6 (H) 3.8 - 10.6 K/uL   RBC 4.92 4.40 - 5.90 MIL/uL   Hemoglobin 14.6 13.0 - 18.0 g/dL   HCT 42.8 40.0 - 52.0 %   MCV 87.0 80.0 - 100.0 fL   MCH 29.8 26.0 - 34.0 pg   MCHC 34.2 32.0 - 36.0 g/dL   RDW 13.0 11.5 - 14.5 %   Platelets 281 150 - 440 K/uL   Neutrophils Relative % 83 %   Neutro Abs 11.2 (H) 1.4 - 6.5 K/uL   Lymphocytes Relative 8 %   Lymphs Abs 1.1 1.0 - 3.6 K/uL   Monocytes Relative  9 %   Monocytes Absolute 1.3 (H) 0.2 - 1.0 K/uL   Eosinophils Relative 0 %   Eosinophils Absolute 0.0 0 - 0.7 K/uL   Basophils Relative 0 %   Basophils Absolute 0.1 0 - 0.1 K/uL  Urinalysis, Complete w Microscopic     Status: Abnormal   Collection Time: 10/17/16  5:17 PM  Result Value Ref Range   Color, Urine YELLOW YELLOW   APPearance CLEAR CLEAR   Specific Gravity, Urine 1.025 1.005 - 1.030   pH 6.0 5.0 - 8.0   Glucose, UA NEGATIVE NEGATIVE mg/dL   Hgb urine dipstick NEGATIVE NEGATIVE   Bilirubin Urine NEGATIVE NEGATIVE   Ketones, ur NEGATIVE NEGATIVE mg/dL   Protein, ur TRACE (A) NEGATIVE mg/dL   Nitrite NEGATIVE NEGATIVE   Leukocytes, UA NEGATIVE NEGATIVE   Squamous Epithelial / LPF NONE SEEN NONE SEEN   WBC, UA 0-5 0 - 5 WBC/hpf   RBC / HPF NONE SEEN 0 - 5 RBC/hpf   Bacteria, UA NONE SEEN NONE SEEN   Mucus PRESENT   POCT urinalysis dipstick     Status: None   Collection Time: 11/30/16  3:32 PM  Result Value Ref Range   Color, UA yellow    Clarity, UA clear    Glucose, UA negative    Bilirubin, UA negative    Ketones, UA negative    Spec Grav, UA 1.010 1.010 - 1.025   Blood, UA negative    pH, UA 5.0 5.0 - 8.0   Protein, UA negative    Urobilinogen, UA 0.2 0.2 or 1.0 E.U./dL   Nitrite, UA negative    Leukocytes, UA Negative Negative     Assessment & Plan  1. Moderate major depression (HCC) - escitalopram (LEXAPRO) 20 MG tablet; Take 1 tablet (20 mg total) daily by mouth. Take 1/2 tablet x7 days, then take 1 tablet daily.  Dispense: 30 tablet; Refill: 1 - Advised to begin with 1/2 tablet x7 days, then increase to 1 tablet daily.  - Mindfulness and meditation discussed at length. Pt declines counseling today. - PHQ-9 score of 21 today.  2. Diarrhea, unspecified type - Discussed strategies to reduce diarrhea including dietary modifications. - Discussed possible correlation between stress at work, depression, and GI upset - pt verbalizes understanding - CBC -  BASIC METABOLIC PANEL WITH GFR - Return in 2-5 days if not improving, sooner if worsening.  3. Right lower quadrant abdominal pain - POCT urinalysis dipstick - CBC - BASIC METABOLIC PANEL WITH GFR - Discussed presence of tenderness  in RLQ at length - advised labs per orders and to return for further evaluation if pain does not improve.  Low suspicion of appendicitis today as patient is afebrile, denies nausea and vomiting, and pain has remained stable over the last 36 hours - however, I advised he present for emergency care if any changes occur including sudden abdominal distension/taught abdomen, vomiting, or sudden increase in pain. - Return in 2-5 days if not improving, sooner if worsening.  -Red flags and when to present for emergency care or RTC including fever >101.54F, chest pain, shortness of breath, new/worsening/un-resolving symptoms, red flags listed in AVS reviewed with patient at time of visit. Follow up and care instructions discussed and provided in AVS.

## 2016-12-13 ENCOUNTER — Encounter: Payer: Self-pay | Admitting: Family Medicine

## 2016-12-13 ENCOUNTER — Ambulatory Visit: Payer: 59 | Admitting: Family Medicine

## 2016-12-13 VITALS — BP 116/68 | HR 73 | Temp 97.9°F | Resp 16 | Ht 74.0 in | Wt 256.7 lb

## 2016-12-13 DIAGNOSIS — E669 Obesity, unspecified: Secondary | ICD-10-CM | POA: Diagnosis not present

## 2016-12-13 DIAGNOSIS — K219 Gastro-esophageal reflux disease without esophagitis: Secondary | ICD-10-CM

## 2016-12-13 DIAGNOSIS — G4709 Other insomnia: Secondary | ICD-10-CM

## 2016-12-13 DIAGNOSIS — E8881 Metabolic syndrome: Secondary | ICD-10-CM | POA: Insufficient documentation

## 2016-12-13 DIAGNOSIS — F321 Major depressive disorder, single episode, moderate: Secondary | ICD-10-CM

## 2016-12-13 DIAGNOSIS — R69 Illness, unspecified: Secondary | ICD-10-CM | POA: Diagnosis not present

## 2016-12-13 DIAGNOSIS — Z91030 Bee allergy status: Secondary | ICD-10-CM

## 2016-12-13 MED ORDER — EPINEPHRINE 0.3 MG/0.3ML IJ SOAJ: 0.3000 mg | Freq: Once | INTRAMUSCULAR | 1 refills | Status: AC

## 2016-12-13 MED ORDER — ESCITALOPRAM OXALATE 20 MG PO TABS: 20.0000 mg | ORAL_TABLET | Freq: Every day | ORAL | 1 refills | Status: DC

## 2016-12-13 MED ORDER — QUETIAPINE FUMARATE 25 MG PO TABS: 25.0000 mg | ORAL_TABLET | Freq: Every day | ORAL | 1 refills | Status: DC

## 2016-12-13 NOTE — Progress Notes (Signed)
Name: Timothy Fleming   MRN: [id number]    DOB: [birth year]   Date:12/13/2016       Progress Note  Subjective  Chief Complaint  Chief Complaint  Patient presents with  . Follow-up    2 week F/U  . Depression    Started patient on Lexapro 20 mg last visit and doing well with new medication    HPI  Depression major recurrent: he is feeling much better, having more motivation and energy, playing mandalin again, taking Lexapro as prescribed, still under stress at work but is coping better.   Obesity: he has lost weight since last visit, he has been more active/ more motivation. His house is getting remodeled and is eating food from microwave, he is not sure why he is losing weight  GERD: under control with otc medication, no heartburn or regurgitation  Abdominal pain: resolved  Insomnia; taking Seroquel and has been sleeping well, no fatigue or somnolence.    Patient Active Problem List   Diagnosis Date Noted  . Moderate major depression ([place]) 11/30/2016  . History of vasculitis of skin 05/20/2015  . GERD without esophagitis 11/10/2014  . Seasonal allergic rhinitis 11/10/2014  . History of nephrolithiasis 11/10/2014  . Obesity (BMI 30-39.9) 11/10/2014  . Tobacco abuse 11/10/2014  . Snoring 11/10/2014  . Insomnia 11/10/2014    Past Surgical History:  Procedure Laterality Date  . FACIAL RECONSTRUCTION SURGERY  01/13/2008   After a car fell on him while working on it    Family History  Problem Relation Age of Onset  . Varicose Veins Mother   . Endometriosis Mother   . Drug abuse Father     Social History   Socioeconomic History  . Marital status: Married    Spouse name: Not on file  . Number of children: Not on file  . Years of education: Not on file  . Highest education level: Not on file  Social Needs  . Financial resource strain: Not on file  . Food insecurity - worry: Not on file  . Food insecurity - inability: Not on file  . Transportation needs - medical:  Not on file  . Transportation needs - non-medical: Not on file  Occupational History  . Not on file  Tobacco Use  . Smoking status: Current Some Day Smoker    Packs/day: 0.25    Years: 15.00    Pack years: 3.75    Types: Cigarettes    Start date: 11/10/1999  . Smokeless tobacco: Never Used  . Tobacco comment: e cig mostly  Substance and Sexual Activity  . Alcohol use: No    Alcohol/week: 0.0 oz  . Drug use: No  . Sexual activity: Yes    Partners: Female  Other Topics Concern  . Not on file  Social History Narrative   He is married has one son.    He was addicted to video games, but is back playing guitar and going to the gym since 2018     Current Outpatient Medications:  .  EPINEPHrine (EPIPEN 2-PAK) 0.3 mg/0.3 mL IJ SOAJ injection, Inject 0.3 mLs (0.3 mg total) into the muscle once for 1 dose., Disp: 1 Device, Rfl: 1 .  escitalopram (LEXAPRO) 20 MG tablet, Take 1 tablet (20 mg total) by mouth daily. Take 1/2 tablet x7 days, then take 1 tablet daily., Disp: 90 tablet, Rfl: 1 .  omeprazole (PRILOSEC) 20 MG capsule, Take 20 mg by mouth daily., Disp: , Rfl:  .  QUEtiapine (SEROQUEL)  25 MG tablet, Take 1 tablet (25 mg total) by mouth at bedtime., Disp: 90 tablet, Rfl: 1  Allergies  Allergen Reactions  . Bee Venom Anaphylaxis, Shortness Of Breath and Swelling  . Penicillins Hives    vasculitis     ROS  Constitutional: Negative for fever , positive for weight change.  Respiratory: Negative for cough and shortness of breath.   Cardiovascular: Negative for chest pain or palpitations.  Gastrointestinal: Negative for abdominal pain, no bowel changes.  Musculoskeletal: Negative for gait problem or joint swelling.  Skin: Negative for rash.  Neurological: Negative for dizziness or headache.  No other specific complaints in a complete review of systems (except as listed in HPI above).  Objective  Vitals:   12/13/16 1525  BP: 116/68  Pulse: 73  Resp: 16  Temp: 97.9 F  (36.6 C)  TempSrc: Oral  SpO2: 99%  Weight: 256 lb 11.2 oz (116.4 kg)  Height: 6' 2"  (1.88 m)    Body mass index is 32.96 kg/m.  Physical Exam  Constitutional: Patient appears well-developed and well-nourished. Obese No distress.  HEENT: head atraumatic, normocephalic, pupils equal and reactive to light, neck supple, throat within normal limits Cardiovascular: Normal rate, regular rhythm and normal heart sounds.  No murmur heard. No BLE edema. Pulmonary/Chest: Effort normal and breath sounds normal. No respiratory distress. Abdominal: Soft.  There is no tenderness. Psychiatric: Patient has a normal mood and affect. behavior is normal. Judgment and thought content normal.  Recent Results (from the past 2160 hour(s))  Hemoglobin A1c     Status: None   Collection Time: 09/15/16 12:12 PM  Result Value Ref Range   Hgb A1c MFr Bld 5.3 <5.7 %    Comment:   For the purpose of screening for the presence of diabetes:   <5.7%       Consistent with the absence of diabetes 5.7-6.4 %   Consistent with increased risk for diabetes (prediabetes) >=6.5 %     Consistent with diabetes   This assay result is consistent with a decreased risk of diabetes.   Currently, no consensus exists regarding use of hemoglobin A1c for diagnosis of diabetes in children.   According to American Diabetes Association (ADA) guidelines, hemoglobin A1c <7.0% represents optimal control in non-pregnant diabetic patients. Different metrics may apply to specific patient populations. Standards of Medical Care in Diabetes (ADA).      Mean Plasma Glucose 105 mg/dL  Lipid panel     Status: Abnormal   Collection Time: 09/15/16 12:12 PM  Result Value Ref Range   Cholesterol 160 <200 mg/dL   Triglycerides 132 <150 mg/dL   HDL 37 (L) >40 mg/dL   Total CHOL/HDL Ratio 4.3 <5.0 Ratio   VLDL 26 <30 mg/dL   LDL Cholesterol 97 <100 mg/dL  COMPLETE METABOLIC PANEL WITH GFR     Status: None   Collection Time: 09/15/16 12:12  PM  Result Value Ref Range   Sodium 138 135 - 146 mmol/L   Potassium 4.7 3.5 - 5.3 mmol/L   Chloride 105 98 - 110 mmol/L   CO2 26 20 - 32 mmol/L    Comment: ** Please note change in reference range(s). **      Glucose, Bld 87 65 - 99 mg/dL   BUN 13 7 - 25 mg/dL   Creat 0.92 0.60 - 1.35 mg/dL   Total Bilirubin 0.5 0.2 - 1.2 mg/dL   Alkaline Phosphatase 65 40 - 115 U/L   AST 15 10 - 40 U/L  ALT 17 9 - 46 U/L   Total Protein 7.2 6.1 - 8.1 g/dL   Albumin 4.6 3.6 - 5.1 g/dL   Calcium 9.5 8.6 - 10.3 mg/dL   GFR, Est African American >89 >=60 mL/min   GFR, Est Non African American >89 >=60 mL/min  CBC with Differential/Platelet     Status: None   Collection Time: 09/15/16 12:12 PM  Result Value Ref Range   WBC 8.2 3.8 - 10.8 K/uL   RBC 4.93 4.20 - 5.80 MIL/uL   Hemoglobin 14.9 13.2 - 17.1 g/dL   HCT 44.4 38.5 - 50.0 %   MCV 90.1 80.0 - 100.0 fL   MCH 30.2 27.0 - 33.0 pg   MCHC 33.6 32.0 - 36.0 g/dL   RDW 13.7 11.0 - 15.0 %   Platelets 331 140 - 400 K/uL   MPV 9.3 7.5 - 12.5 fL   Neutro Abs 4,756 1,500 - 7,800 cells/uL   Lymphs Abs 2,296 850 - 3,900 cells/uL   Monocytes Absolute 902 200 - 950 cells/uL   Eosinophils Absolute 246 15 - 500 cells/uL   Basophils Absolute 0 0 - 200 cells/uL   Neutrophils Relative % 58 %   Lymphocytes Relative 28 %   Monocytes Relative 11 %   Eosinophils Relative 3 %   Basophils Relative 0 %   Smear Review Criteria for review not met   TSH     Status: None   Collection Time: 09/15/16 12:12 PM  Result Value Ref Range   TSH 1.33 0.40 - 4.50 mIU/L  Insulin, fasting     Status: None   Collection Time: 09/15/16 12:12 PM  Result Value Ref Range   Insulin fasting, serum 14.6 2.0 - 19.6 uIU/mL    Comment:   This insulin assay shows strong cross-reactivity for some insulin analogs (lispro, aspart, and glargine) and much lower cross-reactivity with others (detemir, glulisine).   Stimulated Insulin reference intervals were established using  the Siemens Immulite assay. These values are provided for general guidance only.   Lipase, blood     Status: None   Collection Time: 10/17/16  5:17 PM  Result Value Ref Range   Lipase 22 11 - 51 U/L  Comprehensive metabolic panel     Status: Abnormal   Collection Time: 10/17/16  5:17 PM  Result Value Ref Range   Sodium 136 135 - 145 mmol/L   Potassium 3.5 3.5 - 5.1 mmol/L   Chloride 100 (L) 101 - 111 mmol/L   CO2 26 22 - 32 mmol/L   Glucose, Bld 101 (H) 65 - 99 mg/dL   BUN 12 6 - 20 mg/dL   Creatinine, Ser 1.18 0.61 - 1.24 mg/dL   Calcium 8.9 8.9 - 10.3 mg/dL   Total Protein 8.0 6.5 - 8.1 g/dL   Albumin 4.4 3.5 - 5.0 g/dL   AST 21 15 - 41 U/L   ALT 18 17 - 63 U/L   Alkaline Phosphatase 66 38 - 126 U/L   Total Bilirubin 1.2 0.3 - 1.2 mg/dL   GFR calc non Af Amer >60 >60 mL/min   GFR calc Af Amer >60 >60 mL/min    Comment: (NOTE) The eGFR has been calculated using the CKD EPI equation. This calculation has not been validated in all clinical situations. eGFR's persistently <60 mL/min signify possible Chronic Kidney Disease.    Anion gap 10 5 - 15  CBC with Differential     Status: Abnormal   Collection Time: 10/17/16  5:17 PM  Result Value Ref Range   WBC 13.6 (H) 3.8 - 10.6 K/uL   RBC 4.92 4.40 - 5.90 MIL/uL   Hemoglobin 14.6 13.0 - 18.0 g/dL   HCT 42.8 40.0 - 52.0 %   MCV 87.0 80.0 - 100.0 fL   MCH 29.8 26.0 - 34.0 pg   MCHC 34.2 32.0 - 36.0 g/dL   RDW 13.0 11.5 - 14.5 %   Platelets 281 150 - 440 K/uL   Neutrophils Relative % 83 %   Neutro Abs 11.2 (H) 1.4 - 6.5 K/uL   Lymphocytes Relative 8 %   Lymphs Abs 1.1 1.0 - 3.6 K/uL   Monocytes Relative 9 %   Monocytes Absolute 1.3 (H) 0.2 - 1.0 K/uL   Eosinophils Relative 0 %   Eosinophils Absolute 0.0 0 - 0.7 K/uL   Basophils Relative 0 %   Basophils Absolute 0.1 0 - 0.1 K/uL  Urinalysis, Complete w Microscopic     Status: Abnormal   Collection Time: 10/17/16  5:17 PM  Result Value Ref Range   Color, Urine YELLOW  YELLOW   APPearance CLEAR CLEAR   Specific Gravity, Urine 1.025 1.005 - 1.030   pH 6.0 5.0 - 8.0   Glucose, UA NEGATIVE NEGATIVE mg/dL   Hgb urine dipstick NEGATIVE NEGATIVE   Bilirubin Urine NEGATIVE NEGATIVE   Ketones, ur NEGATIVE NEGATIVE mg/dL   Protein, ur TRACE (A) NEGATIVE mg/dL   Nitrite NEGATIVE NEGATIVE   Leukocytes, UA NEGATIVE NEGATIVE   Squamous Epithelial / LPF NONE SEEN NONE SEEN   WBC, UA 0-5 0 - 5 WBC/hpf   RBC / HPF NONE SEEN 0 - 5 RBC/hpf   Bacteria, UA NONE SEEN NONE SEEN   Mucus PRESENT   POCT urinalysis dipstick     Status: None   Collection Time: 11/30/16  3:32 PM  Result Value Ref Range   Color, UA yellow    Clarity, UA clear    Glucose, UA negative    Bilirubin, UA negative    Ketones, UA negative    Spec Grav, UA 1.010 1.010 - 1.025   Blood, UA negative    pH, UA 5.0 5.0 - 8.0   Protein, UA negative    Urobilinogen, UA 0.2 0.2 or 1.0 E.U./dL   Nitrite, UA negative    Leukocytes, UA Negative Negative  CBC     Status: None   Collection Time: 11/30/16  4:16 PM  Result Value Ref Range   WBC 10.7 3.8 - 10.8 Thousand/uL   RBC 5.07 4.20 - 5.80 Million/uL   Hemoglobin 14.7 13.2 - 17.1 g/dL   HCT 43.7 38.5 - 50.0 %   MCV 86.2 80.0 - 100.0 fL   MCH 29.0 27.0 - 33.0 pg   MCHC 33.6 32.0 - 36.0 g/dL   RDW 12.4 11.0 - 15.0 %   Platelets 344 140 - 400 Thousand/uL   MPV 10.1 7.5 - 12.5 fL  BASIC METABOLIC PANEL WITH GFR     Status: None   Collection Time: 11/30/16  4:16 PM  Result Value Ref Range   Glucose, Bld 88 65 - 139 mg/dL    Comment: .        Non-fasting reference interval .    BUN 10 7 - 25 mg/dL   Creat 1.03 0.60 - 1.35 mg/dL   GFR, Est Non African American 96 > OR = 60 mL/min/1.59m   GFR, Est African American 112 > OR = 60 mL/min/1.755m  BUN/Creatinine Ratio NOT APPLICABLE 6 -  22 (calc)   Sodium 138 135 - 146 mmol/L   Potassium 4.0 3.5 - 5.3 mmol/L   Chloride 103 98 - 110 mmol/L   CO2 27 20 - 32 mmol/L   Calcium 9.6 8.6 - 10.3 mg/dL     PHQ2/9: Depression screen [facility] 2/9 12/13/2016 12/13/2016 11/30/2016 09/15/2016 07/19/2016  Decreased Interest - 0 3 3 0  Down, Depressed, Hopeless - 1 3 2  0  PHQ - 2 Score - 1 6 5  0  Altered sleeping (No Data) 2 3 0 -  Tired, decreased energy - 0 2 3 -  Change in appetite - 0 3 2 -  Feeling bad or failure about yourself  - 0 3 1 -  Trouble concentrating - 0 2 0 -  Moving slowly or fidgety/restless - 0 2 1 -  Suicidal thoughts - 0 0 0 -  PHQ-9 Score - 3 21 12  -  Difficult doing work/chores - Somewhat difficult Very difficult Somewhat difficult -     Fall Risk: Fall Risk  12/13/2016 09/15/2016 07/19/2016 04/18/2016 07/08/2015  Falls in the past year? No Yes No No No  Number falls in past yr: - 1 - - -  Injury with Fall? - No - - -     Functional Status Survey: Is the patient deaf or have difficulty hearing?: No Does the patient have difficulty seeing, even when wearing glasses/contacts?: No Does the patient have difficulty concentrating, remembering, or making decisions?: No Does the patient have difficulty walking or climbing stairs?: No Does the patient have difficulty dressing or bathing?: No Does the patient have difficulty doing errands alone such as visiting a doctor's office or shopping?: No    Assessment & Plan  1. Moderate major depression ([facility]CC)  Doing much better now - escitalopram (LEXAPRO) 20 MG tablet; Take 1 tablet (20 mg total) by mouth daily. Take 1/2 tablet x7 days, then take 1 tablet daily.  Dispense: 90 tablet; Refill: 1  2. Other insomnia  - QUEtiapine (SEROQUEL) 25 MG tablet; Take 1 tablet (25 mg total) by mouth at bedtime.  Dispense: 90 tablet; Refill: 1  3. GERD without esophagitis  Taking otc medication and symptoms are controlled  4. Obesity (BMI 30-39.9)  Losing weight.   5. Bee sting allergy  - EPINEPHrine (EPIPEN 2-PAK) 0.3 mg/0.3 mL IJ SOAJ injection; Inject 0.3 mLs (0.3 mg total) into the muscle once for 1 dose.  Dispense: 1 Device;  Refill: 1  6. Metabolic syndrome  Elevated insulin, advised to avoid carbs and exercise more

## 2017-03-16 ENCOUNTER — Ambulatory Visit: Payer: 59 | Admitting: Family Medicine

## 2017-03-19 ENCOUNTER — Encounter: Payer: Self-pay | Admitting: Family Medicine

## 2017-03-19 ENCOUNTER — Ambulatory Visit: Payer: 59 | Admitting: Family Medicine

## 2017-03-19 VITALS — BP 118/64 | HR 94 | Temp 97.7°F | Resp 16 | Ht 74.0 in | Wt 255.8 lb

## 2017-03-19 DIAGNOSIS — E8881 Metabolic syndrome: Secondary | ICD-10-CM

## 2017-03-19 DIAGNOSIS — R69 Illness, unspecified: Secondary | ICD-10-CM | POA: Diagnosis not present

## 2017-03-19 DIAGNOSIS — J111 Influenza due to unidentified influenza virus with other respiratory manifestations: Secondary | ICD-10-CM

## 2017-03-19 DIAGNOSIS — K219 Gastro-esophageal reflux disease without esophagitis: Secondary | ICD-10-CM | POA: Diagnosis not present

## 2017-03-19 DIAGNOSIS — G4709 Other insomnia: Secondary | ICD-10-CM

## 2017-03-19 DIAGNOSIS — E669 Obesity, unspecified: Secondary | ICD-10-CM | POA: Diagnosis not present

## 2017-03-19 DIAGNOSIS — F321 Major depressive disorder, single episode, moderate: Secondary | ICD-10-CM | POA: Diagnosis not present

## 2017-03-19 MED ORDER — OSELTAMIVIR PHOSPHATE 75 MG PO CAPS: 75.0000 mg | ORAL_CAPSULE | Freq: Two times a day (BID) | ORAL | 0 refills | Status: DC

## 2017-03-19 NOTE — Patient Instructions (Signed)

## 2017-03-19 NOTE — Progress Notes (Signed)
Name: Timothy Fleming   MRN: [id number]    DOB: Jul 27, 1985   Date:03/19/2017       Progress Note  Subjective  Chief Complaint  Chief Complaint  Patient presents with  . Medication Refill    3 month F/U  . Depression    His left eye has been twitching occasionally has been aggravating him. His step sister just found out about him and she is 33 years old. Has a bunch of different feeling about it.  . Gastroesophageal Reflux  . Insomnia  . Obesity  . Influenza    Son tested positive on Saturday he had diarrhea, vomiting-now [place] has been feeling clammy and vomitting along with diarrhea.     HPI  Depression major recurrent: he is feeling much better, having more motivation and energy, playing mandalin again, taking Lexapro as prescribed, still under stress at work but is coping better. His half-sister just reached out to him ( she just found out), he has noticed some myoclonus of right eye lid.   Obesity: weight is unchanged since last visit, he has been active still working on his house and has an active job, but does not exercise outside of that. They still don't have a chicken and is eating microwaved food or fast food  GERD: under control with otc medication, no heartburn or regurgitation  Insomnia; taking Seroquel and has been sleeping well, no fatigue or somnolence if he has enough sleep time. However does not take medication when on call.     Flu: son diagnosed with stomach flu three days ago, and this morning he woke up with chills, also nausea, vomiting and diarrhea. He is having body aches.   Patient Active Problem List   Diagnosis Date Noted  . Metabolic syndrome 12/13/2016  . Moderate major depression (HCC) 11/30/2016  . History of vasculitis of skin 05/20/2015  . GERD without esophagitis 11/10/2014  . Seasonal allergic rhinitis 11/10/2014  . History of nephrolithiasis 11/10/2014  . Obesity (BMI 30-39.9) 11/10/2014  . Tobacco abuse 11/10/2014  . Snoring 11/10/2014   . Insomnia 11/10/2014    Social History   Tobacco Use  . Smoking status: Current Some Day Smoker    Packs/day: 0.25    Years: 15.00    Pack years: 3.75    Types: Cigarettes    Start date: 11/10/1999  . Smokeless tobacco: Never Used  . Tobacco comment: e cig mostly  Substance Use Topics  . Alcohol use: No    Alcohol/week: 0.0 oz     Current Outpatient Medications:  .  escitalopram (LEXAPRO) 20 MG tablet, Take 1 tablet (20 mg total) by mouth daily. Take 1/2 tablet x7 days, then take 1 tablet daily., Disp: 90 tablet, Rfl: 1 .  omeprazole (PRILOSEC) 20 MG capsule, Take 20 mg by mouth daily., Disp: , Rfl:  .  QUEtiapine (SEROQUEL) 25 MG tablet, Take 1 tablet (25 mg total) by mouth at bedtime., Disp: 90 tablet, Rfl: 1 .  oseltamivir (TAMIFLU) 75 MG capsule, Take 1 capsule (75 mg total) by mouth 2 (two) times daily., Disp: 10 capsule, Rfl: 0  Allergies  Allergen Reactions  . Bee Venom Anaphylaxis, Shortness Of Breath and Swelling  . Penicillins Hives    vasculitis    ROS  Constitutional: Negative for fever or weight change.  Respiratory: Negative for cough and shortness of breath.   Cardiovascular: Negative for chest pain or palpitations.  Gastrointestinal: Negative for abdominal pain, positive for  bowel changes.  Musculoskeletal: Negative for  gait problem or joint swelling.  Skin: Negative for rash.  Neurological: Negative for dizziness or headache.  No other specific complaints in a complete review of systems (except as listed in HPI above).   Objective  Vitals:   03/19/17 1537  BP: 118/64  Pulse: 94  Resp: 16  Temp: 97.7 F (36.5 C)  TempSrc: Oral  SpO2: 95%  Weight: 255 lb 12.8 oz (116 kg)  Height: 6\' 2"  (1.88 m)    Body mass index is 32.84 kg/m.    Physical Exam  Constitutional: Patient appears well-developed and well-nourished. Obese No distress.  HEENT: head atraumatic, normocephalic, pupils equal and reactive to light, normal TM bilaterally ,  neck supple, throat within normal limits Cardiovascular: Normal rate, regular rhythm and normal heart sounds.  No murmur heard. No BLE edema. Pulmonary/Chest: Effort normal and breath sounds normal. No respiratory distress. Abdominal: Soft.  There is no tenderness. Psychiatric: Patient has a normal mood and affect. behavior is normal. Judgment and thought content normal.  [facility] 03/19/2017 12/13/2016 12/13/2016  Decreased Interest 0 - 0  Down, Depressed, Hopeless 1 - 1  PHQ - 2 Score 1 - 1  Altered sleeping 3 (No Data) 2  Tired, decreased energy 1 - 0  Change in appetite 1 - 0  Feeling bad or failure about yourself  0 - 0  Trouble concentrating 0 - 0  Moving slowly or fidgety/restless 0 - 0  Suicidal thoughts 0 - 0  PHQ-9 Score 6 - 3  Difficult doing work/chores Somewhat difficult - Somewhat difficult   Assessment & Plan  1. Moderate major depression (HCC)  He states he is feeling better, tired today and change in appetite because he has been sick  2. Flu  - oseltamivir (TAMIFLU) 75 MG capsule; Take 1 capsule (75 mg total) by mouth 2 (two) times daily.  Dispense: 10 capsule; Refill: 0  3. Other insomnia  Sleeping well on Seroquel  4. Obesity (BMI 30-39.9)  Discussed with the patient the risk posed by an increased BMI. Discussed importance of portion control, calorie counting and at least 150 minutes of physical activity weekly. Avoid sweet beverages and drink more water. Eat at least 6 servings of fruit and vegetables daily   5. GERD without esophagitis  Doing well at this time  6. Metabolic syndrome  Discussed life style modification

## 2017-05-28 ENCOUNTER — Emergency Department: Payer: No Typology Code available for payment source

## 2017-05-28 ENCOUNTER — Emergency Department
Admission: EM | Admit: 2017-05-28 | Discharge: 2017-05-28 | Disposition: A | Discharge status: Home / Self Care | Payer: No Typology Code available for payment source | Attending: Emergency Medicine | Admitting: Emergency Medicine

## 2017-05-28 ENCOUNTER — Encounter: Payer: Self-pay | Admitting: Emergency Medicine

## 2017-05-28 ENCOUNTER — Other Ambulatory Visit: Payer: Self-pay

## 2017-05-28 DIAGNOSIS — M25532 Pain in left wrist: Secondary | ICD-10-CM | POA: Diagnosis not present

## 2017-05-28 DIAGNOSIS — Z79899 Other long term (current) drug therapy: Secondary | ICD-10-CM | POA: Diagnosis not present

## 2017-05-28 DIAGNOSIS — M25522 Pain in left elbow: Secondary | ICD-10-CM | POA: Insufficient documentation

## 2017-05-28 DIAGNOSIS — M25512 Pain in left shoulder: Secondary | ICD-10-CM | POA: Insufficient documentation

## 2017-05-28 HISTORY — DX: Insomnia, unspecified: G47.00

## 2017-05-28 MED ORDER — CYCLOBENZAPRINE HCL 10 MG PO TABS: 10.0000 mg | ORAL_TABLET | Freq: Three times a day (TID) | ORAL | 0 refills | Status: AC | PRN

## 2017-05-28 MED ORDER — IBUPROFEN 800 MG PO TABS
800.0000 mg | ORAL_TABLET | Freq: Once | ORAL | Status: AC
  Administered 2017-05-28: 800 mg via ORAL
  Filled 2017-05-28: qty 1

## 2017-05-28 MED ORDER — MELOXICAM 15 MG PO TABS: 15.0000 mg | ORAL_TABLET | Freq: Every day | ORAL | 1 refills | Status: AC

## 2017-05-28 MED ORDER — CYCLOBENZAPRINE HCL 10 MG PO TABS
10.0000 mg | ORAL_TABLET | Freq: Once | ORAL | Status: AC
  Administered 2017-05-28: 10 mg via ORAL
  Filled 2017-05-28: qty 1
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
## 2017-05-29 ENCOUNTER — Telehealth: Payer: Self-pay
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
## 2017-06-07 ENCOUNTER — Other Ambulatory Visit: Payer: Self-pay | Admitting: Family Medicine

## 2017-06-07 DIAGNOSIS — G4709 Other insomnia: Secondary | ICD-10-CM

## 2017-06-19 ENCOUNTER — Ambulatory Visit: Payer: 59 | Admitting: Family Medicine

## 2017-06-19 ENCOUNTER — Encounter: Payer: Self-pay | Admitting: Family Medicine

## 2017-06-19 VITALS — BP 110/64 | HR 104 | Temp 98.2°F | Resp 18 | Ht 74.0 in | Wt 259.6 lb

## 2017-06-19 DIAGNOSIS — K219 Gastro-esophageal reflux disease without esophagitis: Secondary | ICD-10-CM | POA: Diagnosis not present

## 2017-06-19 DIAGNOSIS — F320 Major depressive disorder, single episode, mild: Secondary | ICD-10-CM

## 2017-06-19 DIAGNOSIS — E8881 Metabolic syndrome: Secondary | ICD-10-CM

## 2017-06-19 DIAGNOSIS — E669 Obesity, unspecified: Secondary | ICD-10-CM

## 2017-06-19 DIAGNOSIS — G4709 Other insomnia: Secondary | ICD-10-CM

## 2017-06-19 DIAGNOSIS — M62830 Muscle spasm of back: Secondary | ICD-10-CM

## 2017-06-19 DIAGNOSIS — R69 Illness, unspecified: Secondary | ICD-10-CM | POA: Diagnosis not present

## 2017-06-19 MED ORDER — ESCITALOPRAM OXALATE 20 MG PO TABS: 20.0000 mg | ORAL_TABLET | Freq: Every day | ORAL | 1 refills | Status: DC

## 2017-06-19 MED ORDER — QUETIAPINE FUMARATE 25 MG PO TABS: 25.0000 mg | ORAL_TABLET | Freq: Every day | ORAL | 0 refills | Status: DC

## 2017-06-19 NOTE — Progress Notes (Signed)
Name: Timothy Fleming   MRN: [id number]    DOB: 06-20-1985   Date:06/19/2017       Progress Note  Subjective  Chief Complaint  Chief Complaint  Patient presents with  . Allergic Rhinitis   . Gastroesophageal Reflux  . Depression  . Medication Refill    HPI  Depression major recurrent: he is feeling much better, having more motivation and energy, re-connected with a high school friend, woking at his house, taking Lexapro as prescribed His half-sister just reached out to him ( found out about it) 2019.   Obesity:  He gained some weight since last visit, discussed importance of losing weight again. He stopped going to the gym because he has too much to do at his house. Needs portion control. They eat out a lot since kitchen is under construction   GERD: under control with otc medication, no heartburn or regurgitation. He gets otc medication - PPI. Discussed long term use of PPI use but did not respond to H2 blocker.   Insomnia; taking Seroquel every night, and sleeps good most days of the week with medication, a couple of times a week he wakes up still feeling tired. He does not know if he snores.   Pre-diabetes: we will recheck labs next visit. Denies polyphagia, polyuria or polydipsia.   Patient Active Problem List   Diagnosis Date Noted  . Metabolic syndrome 12/13/2016  . Moderate major depression (HCC) 11/30/2016  . History of vasculitis of skin 05/20/2015  . GERD without esophagitis 11/10/2014  . Seasonal allergic rhinitis 11/10/2014  . History of nephrolithiasis 11/10/2014  . Obesity (BMI 30-39.9) 11/10/2014  . Tobacco abuse 11/10/2014  . Snoring 11/10/2014  . Insomnia 11/10/2014    Past Surgical History:  Procedure Laterality Date  . FACIAL RECONSTRUCTION SURGERY  01/13/2008   After a car fell on him while working on it    Family History  Problem Relation Age of Onset  . Varicose Veins Mother   . Endometriosis Mother   . Drug abuse Father     Social History    Socioeconomic History  . Marital status: Married    Spouse name: Not on file  . Number of children: Not on file  . Years of education: Not on file  . Highest education level: Not on file  Occupational History  . Not on file  Social Needs  . Financial resource strain: Not on file  . Food insecurity:    Worry: Not on file    Inability: Not on file  . Transportation needs:    Medical: Not on file    Non-medical: Not on file  Tobacco Use  . Smoking status: Never Smoker  . Smokeless tobacco: Never Used  . Tobacco comment: e cig mostly  Substance and Sexual Activity  . Alcohol use: No    Alcohol/week: 0.0 oz  . Drug use: No  . Sexual activity: Yes    Partners: Female  Lifestyle  . Physical activity:    Days per week: Not on file    Minutes per session: Not on file  . Stress: Not on file  Relationships  . Social connections:    Talks on phone: Not on file    Gets together: Not on file    Attends religious service: Not on file    Active member of club or organization: Not on file    Attends meetings of clubs or organizations: Not on file    Relationship status: Not on file  .  Intimate partner violence:    Fear of current or ex partner: Not on file    Emotionally abused: Not on file    Physically abused: Not on file    Forced sexual activity: Not on file  Other Topics Concern  . Not on file  Social History Narrative   He is married has one son.    He was addicted to video games, but is back playing guitar and going to the gym since 2018     Current Outpatient Medications:  .  escitalopram (LEXAPRO) 20 MG tablet, Take 1 tablet (20 mg total) by mouth daily. Take 1/2 tablet x7 days, then take 1 tablet daily., Disp: 90 tablet, Rfl: 1 .  omeprazole (PRILOSEC) 20 MG capsule, Take 20 mg by mouth daily., Disp: , Rfl:  .  QUEtiapine (SEROQUEL) 25 MG tablet, TAKE 1 TABLET BY MOUTH AT BEDTIME, Disp: 90 tablet, Rfl: 0  Allergies  Allergen Reactions  . Bee Venom Anaphylaxis,  Shortness Of Breath and Swelling  . Penicillins Hives    vasculitis     ROS  Constitutional: Negative for fever or significant weight change.  Respiratory: Negative for cough and shortness of breath.   Cardiovascular: Negative for chest pain or palpitations.  Gastrointestinal: Negative for abdominal pain, no bowel changes.  Musculoskeletal: Negative for gait problem or joint swelling.  Skin: Negative for rash.  Neurological: Negative for dizziness or headache.  No other specific complaints in a complete review of systems (except as listed in HPI above).  Objective  Vitals:   06/19/17 1524  BP: 110/64  Pulse: (!) 104  Resp: 18  Temp: 98.2 F (36.8 C)  TempSrc: Oral  SpO2: 98%  Weight: 259 lb 9.6 oz (117.8 kg)  Height:  (1.88 m)    Body mass index is 33.33 kg/m.  Physical Exam  Constitutional: Patient appears well-developed and well-nourished. Obese  No distress.  HEENT: head atraumatic, normocephalic, pupils equal and reactive to light,  neck supple, throat within normal limits Cardiovascular: Normal rate, regular rhythm and normal heart sounds.  No murmur heard. No BLE edema. Pulmonary/Chest: Effort normal and breath sounds normal. No respiratory distress. Abdominal: Soft.  There is no tenderness. Psychiatric: Patient has a normal mood and affect. behavior is normal. Judgment and thought content normal.  PHQ2/9: Depression screen [facility] 2/9 06/19/2017 03/19/2017 12/13/2016 12/13/2016 11/30/2016  Decreased Interest 0 0 - 0 3  Down, Depressed, Hopeless 0 1 - 1 3  PHQ - 2 Score 0 1 - 1 6  Altered sleeping 1 3 (No Data) 2 3  Tired, decreased energy 0 1 - 0 2  Change in appetite 1 1 - 0 3  Feeling bad or failure about yourself  0 0 - 0 3  Trouble concentrating 0 0 - 0 2  Moving slowly or fidgety/restless 0 0 - 0 2  Suicidal thoughts 0 0 - 0 0  PHQ-9 Score 2 6 - 3 21  Difficult doing work/chores Not difficult at all Somewhat difficult - Somewhat difficult Very  difficult     Fall Risk: Fall Risk  06/19/2017 03/19/2017 12/13/2016 09/15/2016 07/19/2016  Falls in the past year? No No No Yes No  Number falls in past yr: - - - 1 -  Injury with Fall? - - - No -     Functional Status Survey: Is the patient deaf or have difficulty hearing?: No Does the patient have difficulty seeing, even when wearing glasses/contacts?: Yes(prescription glasses) Does the patient have difficulty  concentrating, remembering, or making decisions?: No Does the patient have difficulty walking or climbing stairs?: No Does the patient have difficulty dressing or bathing?: No Does the patient have difficulty doing errands alone such as visiting a doctor's office or shopping?: No    Assessment & Plan  1. Mild major depression (HCC)  - escitalopram (LEXAPRO) 20 MG tablet; Take 1 tablet (20 mg total) by mouth daily. Take 1/2 tablet x7 days, then take 1 tablet daily.  Dispense: 90 tablet; Refill: 1  2. Obesity (BMI 30-39.9)  Discussed with the patient the risk posed by an increased BMI. Discussed importance of portion control, calorie counting and at least 150 minutes of physical activity weekly. Avoid sweet beverages and drink more water. Eat at least 6 servings of fruit and vegetables daily   3. Other insomnia  - QUEtiapine (SEROQUEL) 25 MG tablet; Take 1 tablet (25 mg total) by mouth at bedtime.  Dispense: 90 tablet; Refill: 0  4. Metabolic syndrome  Recheck labs next visit   5. GERD without esophagitis  Doing well  6. Muscle spasm of back

## 2017-10-17 ENCOUNTER — Encounter: Payer: Self-pay | Admitting: Family Medicine

## 2017-10-17 ENCOUNTER — Ambulatory Visit (INDEPENDENT_AMBULATORY_CARE_PROVIDER_SITE_OTHER): Payer: 59 | Admitting: Family Medicine

## 2017-10-17 VITALS — BP 108/60 | HR 84 | Temp 98.0°F | Resp 16 | Ht 74.0 in | Wt 257.3 lb

## 2017-10-17 DIAGNOSIS — J302 Other seasonal allergic rhinitis: Secondary | ICD-10-CM | POA: Diagnosis not present

## 2017-10-17 DIAGNOSIS — J069 Acute upper respiratory infection, unspecified: Secondary | ICD-10-CM

## 2017-10-17 MED ORDER — AZELASTINE HCL 0.1 % NA SOLN: 1.0000 | Freq: Two times a day (BID) | NASAL | 1 refills | Status: DC

## 2017-10-17 NOTE — Patient Instructions (Signed)
Upper Respiratory Infection, Adult Most upper respiratory infections (URIs) are caused by a virus. A URI affects the nose, throat, and upper air passages. The most common type of URI is often called "the common cold." Follow these instructions at home:  Take medicines only as told by your doctor.  Gargle warm saltwater or take cough drops to comfort your throat as told by your doctor.  Use a warm mist humidifier or inhale steam from a shower to increase air moisture. This may make it easier to breathe.  Drink enough fluid to keep your pee (urine) clear or pale yellow.  Eat soups and other clear broths.  Have a healthy diet.  Rest as needed.  Go back to work when your fever is gone or your doctor says it is okay. ? You may need to stay home longer to avoid giving your URI to others. ? You can also wear a face mask and wash your hands often to prevent spread of the virus.  Use your inhaler more if you have asthma.  Do not use any tobacco products, including cigarettes, chewing tobacco, or electronic cigarettes. If you need help quitting, ask your doctor. Contact a doctor if:  You are getting worse, not better.  Your symptoms are not helped by medicine.  You have chills.  You are getting more short of breath.  You have brown or red mucus.  You have yellow or brown discharge from your nose.  You have pain in your face, especially when you bend forward.  You have a fever.  You have puffy (swollen) neck glands.  You have pain while swallowing.  You have white areas in the back of your throat. Get help right away if:  You have very bad or constant: ? Headache. ? Ear pain. ? Pain in your forehead, behind your eyes, and over your cheekbones (sinus pain). ? Chest pain.  You have long-lasting (chronic) lung disease and any of the following: ? Wheezing. ? Long-lasting cough. ? Coughing up blood. ? A change in your usual mucus.  You have a stiff neck.  You have  changes in your: ? Vision. ? Hearing. ? Thinking. ? Mood. This information is not intended to replace advice given to you by your health care provider. Make sure you discuss any questions you have with your health care provider. Document Released: 06/28/2007 Document Revised: 09/12/2015 Document Reviewed: 04/16/2013 Elsevier Interactive Patient Education  2018 Elsevier Inc.  

## 2017-10-17 NOTE — Progress Notes (Signed)
Name: Timothy Fleming   MRN: [id number]    DOB: [birth year]   Date:10/17/2017       Progress Note  Subjective  Chief Complaint  Chief Complaint  Patient presents with  . Cough    productive cough with thick clear sputum  . Nasal Congestion    clear nasal mucus  . Sinus Problem    facial pain and pressure behind his eyes and forehead  . Fatigue  . Generalized Body Aches    HPI  URI: symptoms started suddenly yesterday am with body aches, sore throat, nasal congestion, chest congestion , no fever or chills. He has seasonal allergies and tried xyzal last night without help.   Patient Active Problem List   Diagnosis Date Noted  . Metabolic syndrome 12/13/2016  . Moderate major depression (HCC) 11/30/2016  . History of vasculitis of skin 05/20/2015  . GERD without esophagitis 11/10/2014  . Seasonal allergic rhinitis 11/10/2014  . History of nephrolithiasis 11/10/2014  . Obesity (BMI 30-39.9) 11/10/2014  . Tobacco abuse 11/10/2014  . Snoring 11/10/2014  . Insomnia 11/10/2014    Social History   Tobacco Use  . Smoking status: Former Smoker    Packs/day: 0.25    Years: 15.00    Pack years: 3.75    Types: Cigarettes    Start date: 11/10/1999    Last attempt to quit: 06/05/2017    Years since quitting: 0.3  . Smokeless tobacco: Never Used  . Tobacco comment: e cig mostly  Substance Use Topics  . Alcohol use: No    Alcohol/week: 0.0 standard drinks     Current Outpatient Medications:  .  escitalopram (LEXAPRO) 20 MG tablet, Take 1 tablet (20 mg total) by mouth daily., Disp: 90 tablet, Rfl: 1 .  omeprazole (PRILOSEC) 20 MG capsule, Take 20 mg by mouth daily., Disp: , Rfl:  .  QUEtiapine (SEROQUEL) 25 MG tablet, Take 1 tablet (25 mg total) by mouth at bedtime., Disp: 90 tablet, Rfl: 0  Allergies  Allergen Reactions  . Bee Venom Anaphylaxis, Shortness Of Breath and Swelling  . Penicillins Hives    vasculitis    ROS  Ten systems reviewed and is negative except as  mentioned in HPI   Objective  Vitals:   10/17/17 1033  BP: 108/60  Pulse: 84  Resp: 16  Temp: 98 F (36.7 C)  TempSrc: Oral  SpO2: 99%  Weight: 257 lb 4.8 oz (116.7 kg)  Height: 6\' 2"  (1.88 m)    Body mass index is 33.04 kg/m.   Physical Exam  Constitutional: Patient appears well-developed and well-nourished. Obese  No distress.  HEENT: head atraumatic, normocephalic, pupils equal and reactive to light, ears mild redness of left TM, no effusion, normal on the right,  neck supple, throat mild erythema of posterior pharynx and palate.  Cardiovascular: Normal rate, regular rhythm and normal heart sounds.  No murmur heard. No BLE edema. Pulmonary/Chest: Effort normal and breath sounds normal. No respiratory distress. Abdominal: Soft.  There is no tenderness. Psychiatric: Patient has a normal mood and affect. behavior is normal. Judgment and thought content normal.  Assessment & Plan  1. URI, acute  Symptomatic therapy suggested: rest , fluids, otc medication, gargle with warm salted water and return office visit prn if symptoms persist or worsen. Lack of antibiotic effectiveness discussed with him. Call or return to clinic prn if these symptoms worsen or fail to improve as anticipated.   2. Seasonal allergic rhinitis, unspecified trigger  - azelastine (ASTELIN) 0.1 %  nasal spray; Place 1 spray into both nostrils 2 (two) times daily. Use in each nostril as directed  Dispense: 30 mL; Refill: 1

## 2017-11-19 DIAGNOSIS — S93409A Sprain of unspecified ligament of unspecified ankle, initial encounter: Secondary | ICD-10-CM | POA: Insufficient documentation

## 2017-11-27 ENCOUNTER — Ambulatory Visit (INDEPENDENT_AMBULATORY_CARE_PROVIDER_SITE_OTHER): Payer: 59 | Admitting: Family Medicine

## 2017-11-27 ENCOUNTER — Encounter: Payer: Self-pay | Admitting: Family Medicine

## 2017-11-27 VITALS — BP 110/68 | HR 76 | Temp 98.2°F | Resp 18 | Ht 74.0 in | Wt 261.3 lb

## 2017-11-27 DIAGNOSIS — G4709 Other insomnia: Secondary | ICD-10-CM | POA: Diagnosis not present

## 2017-11-27 DIAGNOSIS — K219 Gastro-esophageal reflux disease without esophagitis: Secondary | ICD-10-CM | POA: Diagnosis not present

## 2017-11-27 DIAGNOSIS — Z23 Encounter for immunization: Secondary | ICD-10-CM | POA: Diagnosis not present

## 2017-11-27 DIAGNOSIS — F320 Major depressive disorder, single episode, mild: Secondary | ICD-10-CM | POA: Diagnosis not present

## 2017-11-27 DIAGNOSIS — E8881 Metabolic syndrome: Secondary | ICD-10-CM | POA: Diagnosis not present

## 2017-11-27 DIAGNOSIS — E785 Hyperlipidemia, unspecified: Secondary | ICD-10-CM | POA: Diagnosis not present

## 2017-11-27 DIAGNOSIS — E669 Obesity, unspecified: Secondary | ICD-10-CM | POA: Diagnosis not present

## 2017-11-27 DIAGNOSIS — Z0000 Encounter for general adult medical examination without abnormal findings: Secondary | ICD-10-CM

## 2017-11-27 DIAGNOSIS — R69 Illness, unspecified: Secondary | ICD-10-CM | POA: Diagnosis not present

## 2017-11-27 MED ORDER — QUETIAPINE FUMARATE 25 MG PO TABS: 25.0000 mg | ORAL_TABLET | Freq: Every day | ORAL | 1 refills | Status: DC

## 2017-11-27 MED ORDER — ESCITALOPRAM OXALATE 20 MG PO TABS: 20.0000 mg | ORAL_TABLET | Freq: Every day | ORAL | 1 refills | Status: DC

## 2017-11-27 NOTE — Patient Instructions (Signed)
Preventive Care 18-39 Years, Male Preventive care refers to lifestyle choices and visits with your health care provider that can promote health and wellness. What does preventive care include?  A yearly physical exam. This is also called an annual well check.  Dental exams once or twice a year.  Routine eye exams. Ask your health care provider how often you should have your eyes checked.  Personal lifestyle choices, including: ? Daily care of your teeth and gums. ? Regular physical activity. ? Eating a healthy diet. ? Avoiding tobacco and drug use. ? Limiting alcohol use. ? Practicing safe sex. What happens during an annual well check? The services and screenings done by your health care provider during your annual well check will depend on your age, overall health, lifestyle risk factors, and family history of disease. Counseling Your health care provider may ask you questions about your:  Alcohol use.  Tobacco use.  Drug use.  Emotional well-being.  Home and relationship well-being.  Sexual activity.  Eating habits.  Work and work Statistician.  Screening You may have the following tests or measurements:  Height, weight, and BMI.  Blood pressure.  Lipid and cholesterol levels. These may be checked every 5 years starting at age 34.  Diabetes screening. This is done by checking your blood sugar (glucose) after you have not eaten for a while (fasting).  Skin check.  Hepatitis C blood test.  Hepatitis B blood test.  Sexually transmitted disease (STD) testing.  Discuss your test results, treatment options, and if necessary, the need for more tests with your health care provider. Vaccines Your health care provider may recommend certain vaccines, such as:  Influenza vaccine. This is recommended every year.  Tetanus, diphtheria, and acellular pertussis (Tdap, Td) vaccine. You may need a Td booster every 10 years.  Varicella vaccine. You may need this if you  have not been vaccinated.  HPV vaccine. If you are 23 or younger, you may need three doses over 6 months.  Measles, mumps, and rubella (MMR) vaccine. You may need at least one dose of MMR.You may also need a second dose.  Pneumococcal 13-valent conjugate (PCV13) vaccine. You may need this if you have certain conditions and have not been vaccinated.  Pneumococcal polysaccharide (PPSV23) vaccine. You may need one or two doses if you smoke cigarettes or if you have certain conditions.  Meningococcal vaccine. One dose is recommended if you are age 65-21 years and a first-year college student living in a residence hall, or if you have one of several medical conditions. You may also need additional booster doses.  Hepatitis A vaccine. You may need this if you have certain conditions or if you travel or work in places where you may be exposed to hepatitis A.  Hepatitis B vaccine. You may need this if you have certain conditions or if you travel or work in places where you may be exposed to hepatitis B.  Haemophilus influenzae type b (Hib) vaccine. You may need this if you have certain risk factors.  Talk to your health care provider about which screenings and vaccines you need and how often you need them. This information is not intended to replace advice given to you by your health care provider. Make sure you discuss any questions you have with your health care provider. Document Released: 03/07/2001 Document Revised: 09/29/2015 Document Reviewed: 11/10/2014 Elsevier Interactive Patient Education  [employer].

## 2017-11-27 NOTE — Progress Notes (Signed)
Name: Timothy Fleming   MRN: [id number]    DOB: 1985-03-27   Date:11/27/2017       Progress Note  Subjective  Chief Complaint  Chief Complaint  Patient presents with  . Medication Refill    6 month F/U  . Annual Exam  . Depression  . Gastroesophageal Reflux  . Insomnia  . Obesity    HPI  Patient presents for annual CPE and follow up  Depression major recurrent, mild : he is feeling much better, having more motivation and energy, re-connected with a high school friend, woking at his house - kitchen is almost done, taking Lexapro as prescribed His half-sister just reached out to him ( found out about it) 2019.   Obesity:  He gained some weight since last visit, discussed importance of losing weight again. He stopped going to the gym, ankle strain, he will try a keto diet to try to lose weight.   GERD: under control with otc medication, no heartburn or regurgitation. He gets otc medication - PPI. Discussed long term use of PPI use but did not respond to H2 blocker. Doing well at this time  Insomnia; taking Seroquel most nights and is able to fall and stay asleep without any side effects, needs to skip when   Pre-diabetes: we will recheck labs today. Denies polyphagia, polyuria or polydipsia.    Diet:  He currently has not been eating healthy, lots of fast food, he is going to try keto diet, discussed to avoid red meats and fried food if possible  Exercise: not very active now because of ankle injury   Depression:  Depression screen [facility] 2/9 11/27/2017 10/17/2017 06/19/2017 03/19/2017 12/13/2016  Decreased Interest 0 0 0 0 -  Down, Depressed, Hopeless 0 0 0 1 -  PHQ - 2 Score 0 0 0 1 -  Altered sleeping 0 0 1 3 (No Data)  Tired, decreased energy 0 0 0 1 -  Change in appetite 0 0 1 1 -  Feeling bad or failure about yourself  0 0 0 0 -  Trouble concentrating 0 0 0 0 -  Moving slowly or fidgety/restless 0 0 0 0 -  Suicidal thoughts 0 0 0 0 -  PHQ-9 Score 0 0 2 6 -  Difficult  doing work/chores Not difficult at all Not difficult at all Not difficult at all Somewhat difficult -  Some recent data might be hidden    Hypertension:  BP Readings from Last 3 Encounters:  11/27/17 110/68  10/17/17 108/60  06/19/17 110/64    Obesity: Wt Readings from Last 3 Encounters:  11/27/17 261 lb 4.8 oz (118.5 kg)  10/17/17 257 lb 4.8 oz (116.7 kg)  06/19/17 259 lb 9.6 oz (117.8 kg)   BMI Readings from Last 3 Encounters:  11/27/17 33.55 kg/m  10/17/17 33.04 kg/m  06/19/17 33.33 kg/m     Lipids:  Lab Results  Component Value Date   CHOL 160 09/15/2016   CHOL 191 11/04/2014   Lab Results  Component Value Date   HDL 37 (L) 09/15/2016   HDL 37 11/04/2014   Lab Results  Component Value Date   LDLCALC 97 09/15/2016   LDLCALC 125 11/04/2014   Lab Results  Component Value Date   TRIG 132 09/15/2016   TRIG 145 11/04/2014   Lab Results  Component Value Date   CHOLHDL 4.3 09/15/2016   No results found for: LDLDIRECT Glucose:  Glucose  Date Value Ref Range Status  11/18/2012 106 (H)  65 - 99 mg/dL Final  40/98/1191 93 65 - 99 mg/dL Final  47/82/9562 130 (H) 65 - 99 mg/dL Final   Glucose, Bld  Date Value Ref Range Status  11/30/2016 88 65 - 139 mg/dL Final    Comment:    .        Non-fasting reference interval .   10/17/2016 101 (H) 65 - 99 mg/dL Final  86/57/8469 87 65 - 99 mg/dL Final      Office Visit from 10/17/2017 in [facility]  AUDIT-C Score  0      Married STD testing and prevention (HIV/chl/gon/syphilis): Not interested  Hep C: refused   Skin cancer: discussed atypical lesions   IPSS Questionnaire (AUA-7): Over the past month.   1)  How often have you had a sensation of not emptying your bladder completely after you finish urinating?  1 - Less than 1 time in 5  2)  How often have you had to urinate again less than two hours after you finished urinating? 1 - Less than 1 time in 5  3)  How often have you found  you stopped and started again several times when you urinated?  0 - Not at all  4) How difficult have you found it to postpone urination?  0 - Not at all  5) How often have you had a weak urinary stream?  0 - Not at all  6) How often have you had to push or strain to begin urination?  0 - Not at all  7) How many times did you most typically get up to urinate from the time you went to bed until the time you got up in the morning?  1 - 1 time  Total score:  0-7 mildly symptomatic   8-19 moderately symptomatic   20-35 severely symptomatic     Advanced Care Planning: A voluntary discussion about advance care planning including the explanation and discussion of advance directives.  Discussed health care proxy and Living will, and the patient was able to identify a health care proxy as wife.  Patient does not have a living will at present time. If patient does have living will, I have requested they bring this to the clinic to be scanned in to their chart.  Patient Active Problem List   Diagnosis Date Noted  . Sprain of ankle 11/19/2017  . Metabolic syndrome 12/13/2016  . Moderate major depression (HCC) 11/30/2016  . History of vasculitis of skin 05/20/2015  . GERD without esophagitis 11/10/2014  . Seasonal allergic rhinitis 11/10/2014  . History of nephrolithiasis 11/10/2014  . Obesity (BMI 30-39.9) 11/10/2014  . Tobacco abuse 11/10/2014  . Snoring 11/10/2014  . Insomnia 11/10/2014    Past Surgical History:  Procedure Laterality Date  . FACIAL RECONSTRUCTION SURGERY  01/13/2008   After a car fell on him while working on it    Family History  Problem Relation Age of Onset  . Varicose Veins Mother   . Endometriosis Mother   . Drug abuse Father   . GI Disease Father   . Heart attack Maternal Grandmother   . Breast cancer Maternal Grandmother   . Thyroid cancer Maternal Grandmother   . Congestive Heart Failure Maternal Grandfather   . Heart attack Maternal Grandfather   . Stroke  Maternal Grandfather   . Breast cancer Paternal Grandmother   . Heart attack Paternal Grandfather     Social History   Socioeconomic History  . Marital status: Married  Spouse name: Cala Bradford   . Number of children: 1  . Years of education: Not on file  . Highest education level: Some college, no degree  Occupational History  . Not on file  Social Needs  . Financial resource strain: Not hard at all  . Food insecurity:    Worry: Never true    Inability: Never true  . Transportation needs:    Medical: No    Non-medical: No  Tobacco Use  . Smoking status: Former Smoker    Packs/day: 0.25    Years: 15.00    Pack years: 3.75    Types: Cigarettes, E-cigarettes    Start date: 11/10/1999    Last attempt to quit: 06/05/2017    Years since quitting: 0.4  . Smokeless tobacco: Never Used  . Tobacco comment: e cig mostly  Substance and Sexual Activity  . Alcohol use: No    Alcohol/week: 0.0 standard drinks  . Drug use: No  . Sexual activity: Yes    Partners: Female  Lifestyle  . Physical activity:    Days per week: 0 days    Minutes per session: 0 min  . Stress: Not at all  Relationships  . Social connections:    Talks on phone: More than three times a week    Gets together: More than three times a week    Attends religious service: Never    Active member of club or organization: Yes    Attends meetings of clubs or organizations: More than 4 times per year    Relationship status: Married  . Intimate partner violence:    Fear of current or ex partner: No    Emotionally abused: No    Physically abused: No    Forced sexual activity: No  Other Topics Concern  . Not on file  Social History Narrative   He is married has one son.    He was addicted to video games, but is back playing guitar      Current Outpatient Medications:  .  escitalopram (LEXAPRO) 20 MG tablet, Take 1 tablet (20 mg total) by mouth daily., Disp: 90 tablet, Rfl: 1 .  omeprazole (PRILOSEC) 20 MG  capsule, Take 20 mg by mouth daily., Disp: , Rfl:  .  QUEtiapine (SEROQUEL) 25 MG tablet, Take 1 tablet (25 mg total) by mouth at bedtime., Disp: 90 tablet, Rfl: 0 .  azelastine (ASTELIN) 0.1 % nasal spray, Place 1 spray into both nostrils 2 (two) times daily. Use in each nostril as directed (Patient not taking: Reported on 11/27/2017), Disp: 30 mL, Rfl: 1 .  ibuprofen (ADVIL,MOTRIN) 600 MG tablet, ibuprofen 600 mg tablet, Disp: , Rfl:   Allergies  Allergen Reactions  . Bee Venom Anaphylaxis, Shortness Of Breath and Swelling  . Penicillins Hives    vasculitis     ROS  Constitutional: Negative for fever, positive for mild  weight change.  Respiratory: Negative for cough and shortness of breath.   Cardiovascular: Negative for chest pain or palpitations.  Gastrointestinal: Negative for abdominal pain, no bowel changes.  Musculoskeletal:Postiive for gait problem and right ankle  joint swelling ( seeing Ortho ).  Skin: Negative for rash.  Neurological: Negative for dizziness or headache.  No other specific complaints in a complete review of systems (except as listed in HPI above).  Objective  Vitals:   11/27/17 0903  BP: 110/68  Pulse: 76  Resp: 18  Temp: 98.2 F (36.8 C)  TempSrc: Oral  SpO2: 98%  Weight: 261  lb 4.8 oz (118.5 kg)  Height: 6\' 2"  (1.88 m)    Body mass index is 33.55 kg/m.  Physical Exam  Constitutional: Patient appears well-developed and well-nourished. Obese. No distress.  HENT: Head: Normocephalic and atraumatic. Ears: B TMs ok, no erythema or effusion; Nose: Nose normal. Mouth/Throat: Oropharynx is clear and moist. No oropharyngeal exudate.  Eyes: Conjunctivae and EOM are normal. Pupils are equal, round, and reactive to light. No scleral icterus.  Neck: Normal range of motion. Neck supple. No JVD present. No thyromegaly present.  Cardiovascular: Normal rate, regular rhythm and normal heart sounds.  No murmur heard. No BLE edema. Pulmonary/Chest: Effort  normal and breath sounds normal. No respiratory distress. Abdominal: Soft. Bowel sounds are normal, no distension. There is no tenderness. no masses MALE GENITALIA: Normal descended testes bilaterally, no masses palpated, no hernias, no lesions, no discharge RECTAL: Not done  Musculoskeletal: Normal range of motion, no joint effusions. No gross deformities Neurological: he is alert and oriented to person, place, and time. No cranial nerve deficit. Coordination, balance, strength, speech and gait are normal.  Skin: Skin is warm and dry. No rash noted. No erythema.  Psychiatric: Patient has a normal mood and affect. behavior is normal. Judgment and thought content normal.   PHQ2/9: Depression screen [facility] 2/9 11/27/2017 10/17/2017 06/19/2017 03/19/2017 12/13/2016  Decreased Interest 0 0 0 0 -  Down, Depressed, Hopeless 0 0 0 1 -  PHQ - 2 Score 0 0 0 1 -  Altered sleeping 0 0 1 3 (No Data)  Tired, decreased energy 0 0 0 1 -  Change in appetite 0 0 1 1 -  Feeling bad or failure about yourself  0 0 0 0 -  Trouble concentrating 0 0 0 0 -  Moving slowly or fidgety/restless 0 0 0 0 -  Suicidal thoughts 0 0 0 0 -  PHQ-9 Score 0 0 2 6 -  Difficult doing work/chores Not difficult at all Not difficult at all Not difficult at all Somewhat difficult -  Some recent data might be hidden     Fall Risk: Fall Risk  11/27/2017 10/17/2017 06/19/2017 03/19/2017 12/13/2016  Falls in the past year? 0 No No No No  Number falls in past yr: 0 - - - -  Injury with Fall? 0 - - - -     Functional Status Survey: Is the patient deaf or have difficulty hearing?: No Does the patient have difficulty seeing, even when wearing glasses/contacts?: Yes Does the patient have difficulty concentrating, remembering, or making decisions?: No Does the patient have difficulty walking or climbing stairs?: No Does the patient have difficulty dressing or bathing?: No Does the patient have difficulty doing errands alone such as  visiting a doctor's office or shopping?: No    Assessment & Plan  1. Well adult exam  - CBC with Differential/Platelet - COMPLETE METABOLIC PANEL WITH GFR - Hemoglobin A1c - Lipid panel  2. Need for immunization against influenza  - Flu Vaccine QUAD 6+ mos PF IM (Fluarix Quad PF)  3. Mild major depression (HCC)  - escitalopram (LEXAPRO) 20 MG tablet; Take 1 tablet (20 mg total) by mouth daily.  Dispense: 90 tablet; Refill: 1 - CBC with Differential/Platelet - COMPLETE METABOLIC PANEL WITH GFR  4. Other insomnia  - QUEtiapine (SEROQUEL) 25 MG tablet; Take 1 tablet (25 mg total) by mouth at bedtime.  Dispense: 90 tablet; Refill: 1  5. Obesity (BMI 30-39.9)  Discussed with the patient the risk posed by  an increased BMI. Discussed importance of portion control, calorie counting and at least 150 minutes of physical activity weekly. Avoid sweet beverages and drink more water. Eat at least 6 servings of fruit and vegetables daily   6. GERD without esophagitis  Stable on medication   7. Metabolic syndrome  - Hemoglobin A1c  8. Dyslipidemia  - Lipid panel  -Prostate cancer screening and PSA options (with potential risks and benefits of testing vs not testing) were discussed along with recent recs/guidelines. -USPSTF grade A and B recommendations reviewed with patient; age-appropriate recommendations, preventive care, screening tests, etc discussed and encouraged; healthy living encouraged; see AVS for patient education given to patient -Discussed importance of 150 minutes of physical activity weekly, eat two servings of fish weekly, eat one serving of tree nuts ( cashews, pistachios, pecans, almonds.Marland Kitchen) every other day, eat 6 servings of fruit/vegetables daily and drink plenty of water and avoid sweet beverages.

## 2017-11-28 LAB — CBC WITH DIFFERENTIAL/PLATELET
BASOS ABS: 49 {cells}/uL (ref 0–200)
Basophils Relative: 0.6 %
EOS ABS: 271 {cells}/uL (ref 15–500)
Eosinophils Relative: 3.3 %
HCT: 43.2 % (ref 38.5–50.0)
Hemoglobin: 14.8 g/dL (ref 13.2–17.1)
Lymphs Abs: 2148 cells/uL (ref 850–3900)
MCH: 29.5 pg (ref 27.0–33.0)
MCHC: 34.3 g/dL (ref 32.0–36.0)
MCV: 86.2 fL (ref 80.0–100.0)
MPV: 10.2 fL (ref 7.5–12.5)
Monocytes Relative: 10.3 %
NEUTROS PCT: 59.6 %
Neutro Abs: 4887 cells/uL (ref 1500–7800)
PLATELETS: 351 10*3/uL (ref 140–400)
RBC: 5.01 10*6/uL (ref 4.20–5.80)
RDW: 13.1 % (ref 11.0–15.0)
TOTAL LYMPHOCYTE: 26.2 %
WBC: 8.2 10*3/uL (ref 3.8–10.8)
WBCMIX: 845 {cells}/uL (ref 200–950)

## 2017-11-28 LAB — COMPLETE METABOLIC PANEL WITH GFR
AG RATIO: 1.7 (calc) (ref 1.0–2.5)
ALT: 34 U/L (ref 9–46)
AST: 29 U/L (ref 10–40)
Albumin: 4.8 g/dL (ref 3.6–5.1)
Alkaline phosphatase (APISO): 67 U/L (ref 40–115)
BILIRUBIN TOTAL: 0.6 mg/dL (ref 0.2–1.2)
BUN: 12 mg/dL (ref 7–25)
CHLORIDE: 105 mmol/L (ref 98–110)
CO2: 26 mmol/L (ref 20–32)
Calcium: 9.8 mg/dL (ref 8.6–10.3)
Creat: 0.99 mg/dL (ref 0.60–1.35)
GFR, EST AFRICAN AMERICAN: 116 mL/min/{1.73_m2} (ref >=60)
GFR, Est Non African American: 100 mL/min/{1.73_m2} (ref >=60)
GLOBULIN: 2.8 g/dL (ref 1.9–3.7)
Glucose, Bld: 90 mg/dL (ref 65–99)
POTASSIUM: 4.7 mmol/L (ref 3.5–5.3)
SODIUM: 138 mmol/L (ref 135–146)
Total Protein: 7.6 g/dL (ref 6.1–8.1)

## 2017-11-28 LAB — LIPID PANEL
Cholesterol: 197 mg/dL (ref <200)
HDL: 37 mg/dL — AB (ref >40)
LDL Cholesterol (Calc): 128 mg/dL (calc) — ABNORMAL HIGH
Non-HDL Cholesterol (Calc): 160 mg/dL (calc) — ABNORMAL HIGH (ref <130)
TRIGLYCERIDES: 182 mg/dL — AB (ref <150)
Total CHOL/HDL Ratio: 5.3 (calc) — ABNORMAL HIGH (ref <5.0)

## 2017-11-28 LAB — HEMOGLOBIN A1C
EAG (MMOL/L): 6.3 (calc)
Hgb A1c MFr Bld: 5.6 % of total Hgb (ref <5.7)
MEAN PLASMA GLUCOSE: 114 (calc)

## 2017-12-03 DIAGNOSIS — M25373 Other instability, unspecified ankle: Secondary | ICD-10-CM | POA: Insufficient documentation

## 2018-01-08 ENCOUNTER — Ambulatory Visit: Payer: 59 | Admitting: Nurse Practitioner

## 2018-01-08 ENCOUNTER — Other Ambulatory Visit: Payer: Self-pay | Admitting: Nurse Practitioner

## 2018-01-08 ENCOUNTER — Ambulatory Visit
Admission: RE | Admit: 2018-01-08 | Discharge: 2018-01-08 | Disposition: A | Discharge status: Home / Self Care | Payer: 59 | Source: Ambulatory Visit | Attending: Nurse Practitioner | Admitting: Nurse Practitioner

## 2018-01-08 ENCOUNTER — Ambulatory Visit
Admission: RE | Admit: 2018-01-08 | Discharge: 2018-01-08 | Disposition: A | Discharge status: Home / Self Care | Payer: 59 | Attending: Nurse Practitioner | Admitting: Nurse Practitioner

## 2018-01-08 ENCOUNTER — Encounter: Payer: Self-pay | Admitting: Nurse Practitioner

## 2018-01-08 VITALS — BP 108/66 | HR 88 | Temp 98.3°F | Resp 18 | Ht 74.0 in | Wt 264.7 lb

## 2018-01-08 DIAGNOSIS — R05 Cough: Secondary | ICD-10-CM

## 2018-01-08 DIAGNOSIS — R059 Cough, unspecified: Secondary | ICD-10-CM

## 2018-01-08 DIAGNOSIS — M940 Chondrocostal junction syndrome [Tietze]: Secondary | ICD-10-CM | POA: Insufficient documentation

## 2018-01-08 DIAGNOSIS — R5383 Other fatigue: Secondary | ICD-10-CM | POA: Diagnosis not present

## 2018-01-08 DIAGNOSIS — R51 Headache: Secondary | ICD-10-CM | POA: Diagnosis not present

## 2018-01-08 DIAGNOSIS — J0100 Acute maxillary sinusitis, unspecified: Secondary | ICD-10-CM

## 2018-01-08 DIAGNOSIS — R0981 Nasal congestion: Secondary | ICD-10-CM

## 2018-01-08 DIAGNOSIS — R519 Headache, unspecified: Secondary | ICD-10-CM

## 2018-01-08 DIAGNOSIS — R079 Chest pain, unspecified: Secondary | ICD-10-CM | POA: Diagnosis not present

## 2018-01-08 DIAGNOSIS — H65192 Other acute nonsuppurative otitis media, left ear: Secondary | ICD-10-CM

## 2018-01-08 MED ORDER — AZITHROMYCIN 250 MG PO TABS: ORAL_TABLET | ORAL | 0 refills | Status: DC

## 2018-01-08 MED ORDER — FLUTICASONE PROPIONATE 50 MCG/ACT NA SUSP: 2.0000 | Freq: Every day | NASAL | 6 refills | Status: DC

## 2018-01-08 MED ORDER — BENZONATATE 100 MG PO CAPS: 100.0000 mg | ORAL_CAPSULE | Freq: Two times a day (BID) | ORAL | 0 refills | Status: DC | PRN

## 2018-01-08 MED ORDER — NAPROXEN 500 MG PO TABS: 500.0000 mg | ORAL_TABLET | Freq: Two times a day (BID) | ORAL | 0 refills | Status: DC

## 2018-01-08 NOTE — Patient Instructions (Addendum)
-   Please go across the street to the imaging center to get chest x-ray. I will send you an appropriate antibiotics for sinus infection after results come back to ensure I do not also need to cover pneumonia. If you have worsening symptoms or symptoms are not improving within the next 3 days please call back or get urgent medical attention.    It is important that you drink plenty of fluids, rest. Cover your nose/mouth when you cough or sneeze and wash your hands well and often. Here are some helpful things you can use or pick up over the counter from the pharmacy to help with your symptoms:   For Fever/Pain: Acetaminophen every 6 hours as needed (maximum of $RemoveBefore [id number] ibuprofen OR naproxen  For coughing: try dextromethorphan for a cough suppressant, and/or a cool mist humidifier, lozenges  For sore throat: saline gargles, honey herbal tea, lozenges, throat spray  To dry out your nose: try an antihistamine like loratadine (non-sedating) or diphenhydramine (sedating) or others To relieve a stuffy nose: try an oral decongestant  Like flonase, neti pot To make blowing your nose easier: guaifenesin    Costochondritis Costochondritis is swelling and irritation (inflammation) of the tissue (cartilage) that connects your ribs to your breastbone (sternum). This causes pain in the front of your chest. Usually, the pain:  Starts gradually.  Is in more than one rib.  This condition usually goes away on its own over time. Follow these instructions at home:  Do not do anything that makes your pain worse.  If directed, put ice on the painful area: ? Put ice in a plastic bag. ? Place a towel between your skin and the bag. ? Leave the ice on for 20 minutes, 2-3 times a day.  If directed, put heat on the affected area as often as told by your doctor. Use the heat source that your doctor tells you to use, such as a moist heat pack or a heating pad. ? Place a  towel between your skin and the heat source. ? Leave the heat on for 20-30 minutes. ? Take off the heat if your skin turns bright red. This is very important if you cannot feel pain, heat, or cold. You may have a greater risk of getting burned.  Take over-the-counter and prescription medicines only as told by your doctor.  Return to your normal activities as told by your doctor. Ask your doctor what activities are safe for you.  Keep all follow-up visits as told by your doctor. This is important. Contact a doctor if:  You have chills or a fever.  Your pain does not go away or it gets worse.  You have a cough that does not go away. Get help right away if:  You are short of breath. This information is not intended to replace advice given to you by your health care provider. Make sure you discuss any questions you have with your health care provider. Document Released: 06/28/2007 Document Revised: 07/30/2015 Document Reviewed: 05/05/2015 Elsevier Interactive Patient Education  [employer]2018 [employer] Inc[employer]

## 2018-01-08 NOTE — Progress Notes (Signed)
Name: Timothy Fleming   MRN: [id number]    DOB: [birth year]   Date:01/08/2018       Progress Note  Subjective  Chief Complaint  Chief Complaint  Patient presents with  . Nasal Congestion    mucinex sinus nasal spray  . Headache    ibuprofen  . Cough    robitussin  . Fatigue    he will need a work note for today and any other day he is out    HPI  Patient endorses nasal congestion, headaches cough and fatigue, chest congestion ongoing for 6 days.   Has tried musinex, nasal spray, ibuprofen, robitussin with mild relief.  Denies fevers, chills, muffled sounds, ear pain.    Patient Active Problem List   Diagnosis Date Noted  . Sprain of ankle 11/19/2017  . Metabolic syndrome 12/13/2016  . Moderate major depression (HCC) 11/30/2016  . History of vasculitis of skin 05/20/2015  . GERD without esophagitis 11/10/2014  . Seasonal allergic rhinitis 11/10/2014  . History of nephrolithiasis 11/10/2014  . Obesity (BMI 30-39.9) 11/10/2014  . Tobacco abuse 11/10/2014  . Snoring 11/10/2014  . Insomnia 11/10/2014    Past Medical History:  Diagnosis Date  . Allergy    Seasonal  . GERD (gastroesophageal reflux disease)   . History of kidney stones    x4  . Insomnia     Past Surgical History:  Procedure Laterality Date  . FACIAL RECONSTRUCTION SURGERY  01/13/2008   After a car fell on him while working on it    Social History   Tobacco Use  . Smoking status: Former Smoker    Packs/day: 0.25    Years: 15.00    Pack years: 3.75    Types: Cigarettes, E-cigarettes    Start date: 11/10/1999    Last attempt to quit: 06/05/2017    Years since quitting: 0.5  . Smokeless tobacco: Never Used  . Tobacco comment: e cig mostly  Substance Use Topics  . Alcohol use: No    Alcohol/week: 0.0 standard drinks     Current Outpatient Medications:  .  azelastine (ASTELIN) 0.1 % nasal spray, Place 1 spray into both nostrils 2 (two) times daily. Use in each nostril as directed (Patient  not taking: Reported on 11/27/2017), Disp: 30 mL, Rfl: 1 .  escitalopram (LEXAPRO) 20 MG tablet, Take 1 tablet (20 mg total) by mouth daily., Disp: 90 tablet, Rfl: 1 .  ibuprofen (ADVIL,MOTRIN) 600 MG tablet, ibuprofen 600 mg tablet, Disp: , Rfl:  .  omeprazole (PRILOSEC) 20 MG capsule, Take 20 mg by mouth daily., Disp: , Rfl:  .  QUEtiapine (SEROQUEL) 25 MG tablet, Take 1 tablet (25 mg total) by mouth at bedtime., Disp: 90 tablet, Rfl: 1  Allergies  Allergen Reactions  . Bee Venom Anaphylaxis, Shortness Of Breath and Swelling  . Penicillins Hives    vasculitis    ROS   No other specific complaints in a complete review of systems (except as listed in HPI above).  Objective  Vitals:   01/08/18 1150  BP: 108/66  Pulse: 88  Resp: 18  Temp: 98.3 F (36.8 C)  TempSrc: Oral  SpO2: 99%  Weight: 264 lb 11.2 oz (120.1 kg)  Height: 6\' 2"  (1.88 m)   Body mass index is 33.99 kg/m.  Nursing Note and Vital Signs reviewed.  Physical Exam Constitutional:      General: He is not in acute distress.    Appearance: He is well-developed. He is ill-appearing. He is not  toxic-appearing.  HENT:     Head: Normocephalic and atraumatic.     Right Ear: Hearing, tympanic membrane, ear canal and external ear normal.     Left Ear: Hearing, ear canal and external ear normal. No drainage, swelling or tenderness. A middle ear effusion is present. Tympanic membrane is erythematous.     Nose: Mucosal edema and rhinorrhea present.     Right Sinus: No maxillary sinus tenderness or frontal sinus tenderness.     Left Sinus: Maxillary sinus tenderness present. No frontal sinus tenderness.     Mouth/Throat:     Mouth: Mucous membranes are moist.     Pharynx: Oropharynx is clear. Uvula midline. No pharyngeal swelling or oropharyngeal exudate.  Eyes:     Extraocular Movements: Extraocular movements intact.     Pupils: Pupils are equal, round, and reactive to light.  Neck:     Musculoskeletal: Normal range  of motion and neck supple.  Cardiovascular:     Rate and Rhythm: Normal rate and regular rhythm.  Pulmonary:     Effort: Pulmonary effort is normal. No respiratory distress.     Breath sounds: Normal breath sounds.  Chest:     Chest wall: Tenderness (diffuse) present. No deformity, swelling or crepitus.  Skin:    General: Skin is dry.  Neurological:     Mental Status: He is alert and oriented to person, place, and time.     GCS: GCS eye subscore is 4. GCS verbal subscore is 5. GCS motor subscore is 6.  Psychiatric:        Mood and Affect: Mood normal.        Behavior: Behavior normal.      No results found for this or any previous visit (from the past 48 hour(s)).  Assessment & Plan 1. Cough - benzonatate (TESSALON) 100 MG capsule; Take 1 capsule (100 mg total) by mouth 2 (two) times daily as needed for cough.  Dispense: 30 capsule; Refill: 0 - DG Chest 2 View; Future  2. Nasal congestion - fluticasone (FLONASE) 50 MCG/ACT nasal spray; Place 2 sprays into both nostrils daily.  Dispense: 16 g; Refill: 6  3. Acute nonintractable headache, unspecified headache type Discussed OTC management  4. Other fatigue Rest, fluids   5. Costochondritis - naproxen (NAPROSYN) 500 MG tablet; Take 1 tablet (500 mg total) by mouth 2 (two) times daily with a meal.  Dispense: 30 tablet; Refill: 0 - DG Chest 2 View; Future    -Red flags and when to present for emergency care or RTC including fever >101.37F, chest pain, shortness of breath, new/worsening/un-resolving symptoms, reviewed with patient at time of visit. Follow up and care instructions discussed and provided in AVS.

## 2018-03-18 ENCOUNTER — Encounter: Payer: Self-pay | Admitting: Family Medicine

## 2018-03-18 ENCOUNTER — Ambulatory Visit: Payer: 59 | Admitting: Family Medicine

## 2018-03-18 ENCOUNTER — Other Ambulatory Visit (HOSPITAL_COMMUNITY)
Admission: RE | Admit: 2018-03-18 | Discharge: 2018-03-18 | Disposition: A | Discharge status: Home / Self Care | Payer: 59 | Source: Ambulatory Visit | Attending: Family Medicine | Admitting: Family Medicine

## 2018-03-18 VITALS — BP 120/72 | HR 98 | Temp 97.7°F | Resp 18 | Ht 74.0 in | Wt 260.4 lb

## 2018-03-18 DIAGNOSIS — N410 Acute prostatitis: Secondary | ICD-10-CM | POA: Diagnosis not present

## 2018-03-18 DIAGNOSIS — R300 Dysuria: Secondary | ICD-10-CM | POA: Diagnosis not present

## 2018-03-18 LAB — POCT URINALYSIS DIPSTICK
Bilirubin, UA: NEGATIVE
Glucose, UA: NEGATIVE
Ketones, UA: NEGATIVE
Nitrite, UA: NEGATIVE
PH UA: 5 (ref 5.0–8.0)
Protein, UA: POSITIVE — AB
Spec Grav, UA: <=1.005 — AB (ref 1.010–1.025)
UROBILINOGEN UA: NEGATIVE U/dL — AB

## 2018-03-18 MED ORDER — SULFAMETHOXAZOLE-TRIMETHOPRIM 800-160 MG PO TABS: 1.0000 | ORAL_TABLET | Freq: Two times a day (BID) | ORAL | 0 refills | Status: AC

## 2018-03-18 NOTE — Progress Notes (Signed)
Acute Office Visit  Subjective:    Patient ID: Timothy Fleming, male    DOB: 03-14-1985, 33 y.o.   MRN: [id number]  Chief Complaint  Patient presents with  . Urinary Tract Infection    frequency, pain   . Back Pain    low back pain    HPI Patient is in today for dysuria for almost 2 weeks.  He is having ongoing low back pain, having to strain to urinate.  No frank hematuria.  Pain is worse at night when he is sitting still.  Getting up 3-4 times at night to urinate, endorses weakened force of urine stream.  He did take OTC Azo and it did nothing to help his discomfort.  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  No new partners in the last year - has been married for 7 years.   Has history nephrolithiasis, but has never seen nephrology.  He states this pain is not the same as his prior kidney stones.  No fevers/chills, body aches, no severe fatigue.  Past Medical History:  Diagnosis Date  . Allergy    Seasonal  . GERD (gastroesophageal reflux disease)   . History of kidney stones    x4  . Insomnia     Past Surgical History:  Procedure Laterality Date  . FACIAL RECONSTRUCTION SURGERY  01/13/2008   After a car fell on him while working on it    Family History  Problem Relation Age of Onset  . Varicose Veins Mother   . Endometriosis Mother   . Drug abuse Father   . GI Disease Father   . Heart attack Maternal Grandmother   . Breast cancer Maternal Grandmother   . Thyroid cancer Maternal Grandmother   . Congestive Heart Failure Maternal Grandfather   . Heart attack Maternal Grandfather   . Stroke Maternal Grandfather   . Breast cancer Paternal Grandmother   . Heart attack Paternal Grandfather     Social History   Socioeconomic History  . Marital status: Married    Spouse name: Cala Bradford   . Number of children: 1  . Years of education: Not on file  . Highest education level: Some  college, no degree  Occupational History  . Not on file  Social Needs  . Financial resource strain: Not hard at all  . Food insecurity:    Worry: Never true    Inability: Never true  . Transportation needs:    Medical: No    Non-medical: No  Tobacco Use  . Smoking status: Former Smoker    Packs/day: 0.25    Years: 15.00    Pack years: 3.75    Types: Cigarettes, E-cigarettes    Start date: 11/10/1999    Last attempt to quit: 06/05/2017    Years since quitting: 0.7  . Smokeless tobacco: Never Used  . Tobacco comment: e cig mostly  Substance and Sexual Activity  . Alcohol use: No    Alcohol/week: 0.0 standard drinks  . Drug use: No  . Sexual activity: Yes    Partners: Female  Lifestyle  . Physical activity:    Days per week: 0 days    Minutes per session: 0 min  . Stress: Not at all  Relationships  . Social connections:    Talks on phone: More than three times a week    Gets together: More than three times a week  Attends religious service: Never    Active member of club or organization: Yes    Attends meetings of clubs or organizations: More than 4 times per year    Relationship status: Married  . Intimate partner violence:    Fear of current or ex partner: No    Emotionally abused: No    Physically abused: No    Forced sexual activity: No  Other Topics Concern  . Not on file  Social History Narrative   He is married has one son.    He was addicted to video games, but is back playing guitar     Outpatient Medications Prior to Visit  Medication Sig Dispense Refill  . escitalopram (LEXAPRO) 20 MG tablet Take 1 tablet (20 mg total) by mouth daily. 90 tablet 1  . omeprazole (PRILOSEC) 20 MG capsule Take 20 mg by mouth daily.    . QUEtiapine (SEROQUEL) 25 MG tablet Take 1 tablet (25 mg total) by mouth at bedtime. 90 tablet 1  . ibuprofen (ADVIL,MOTRIN) 600 MG tablet ibuprofen 600 mg tablet    . azelastine (ASTELIN) 0.1 % nasal spray Place 1 spray into both  nostrils 2 (two) times daily. Use in each nostril as directed (Patient not taking: Reported on 11/27/2017) 30 mL 1  . azithromycin (ZITHROMAX) 250 MG tablet 2 tablets today and one each day afterwards (Patient not taking: Reported on 03/18/2018) 6 tablet 0  . benzonatate (TESSALON) 100 MG capsule Take 1 capsule (100 mg total) by mouth 2 (two) times daily as needed for cough. (Patient not taking: Reported on 03/18/2018) 30 capsule 0  . fluticasone (FLONASE) 50 MCG/ACT nasal spray Place 2 sprays into both nostrils daily. (Patient not taking: Reported on 03/18/2018) 16 g 6  . naproxen (NAPROSYN) 500 MG tablet Take 1 tablet (500 mg total) by mouth 2 (two) times daily with a meal. (Patient not taking: Reported on 03/18/2018) 30 tablet 0   No facility-administered medications prior to visit.     Allergies  Allergen Reactions  . Bee Venom Anaphylaxis, Shortness Of Breath and Swelling  . Penicillins Hives    vasculitis    ROS Ten systems reviewed and is negative except as mentioned in HPI    Objective:    Physical Exam  Constitutional: He is oriented to person, place, and time. He appears well-developed and well-nourished.  HENT:  Head: Normocephalic and atraumatic.  Right Ear: External ear normal.  Left Ear: External ear normal.  Nose: Nose normal.  Mouth/Throat: Oropharynx is clear and moist. No oropharyngeal exudate.  Eyes: Pupils are equal, round, and reactive to light. Conjunctivae and EOM are normal.  Neck: Normal range of motion. Neck supple. No tracheal deviation present. No thyromegaly present.  Cardiovascular: Normal rate, regular rhythm, normal heart sounds and intact distal pulses. Exam reveals no gallop and no friction rub.  No murmur heard. Pulmonary/Chest: Effort normal and breath sounds normal. No respiratory distress. He has no wheezes. He has no rales.  Abdominal: Soft. Normal appearance and bowel sounds are normal. He exhibits no distension and no mass. There is no  hepatosplenomegaly, splenomegaly or hepatomegaly. There is abdominal tenderness in the right lower quadrant, suprapubic area and left lower quadrant. There is no rebound, no guarding and no CVA tenderness.  Genitourinary:    Penis normal.  Rectum:     External hemorrhoid present.     No rectal mass or abnormal anal tone.  Prostate is tender (mild tenderness on palpation). Prostate is not enlarged (equivocally enlarged).  Musculoskeletal: Normal range of motion.        General: No tenderness, deformity or edema.  Lymphadenopathy:    He has no cervical adenopathy.  Neurological: He is alert and oriented to person, place, and time. He has normal strength. No cranial nerve deficit. Coordination normal.  Skin: Skin is warm and dry. No rash noted. No erythema.  Psychiatric: He has a normal mood and affect. His behavior is normal. Judgment and thought content normal.  Nursing note and vitals reviewed.   BP 120/72 (BP Location: Right Arm, Patient Position: Sitting, Cuff Size: Large)   Pulse 98   Temp 97.7 F (36.5 C) (Oral)   Resp 18   Ht 6\' 2"  (1.88 m)   Wt 260 lb 6.4 oz (118.1 kg)   SpO2 98%   BMI 33.43 kg/m  Wt Readings from Last 3 Encounters:  03/18/18 260 lb 6.4 oz (118.1 kg)  01/08/18 264 lb 11.2 oz (120.1 kg)  11/27/17 261 lb 4.8 oz (118.5 kg)    Health Maintenance Due  Topic Date Due  . HIV Screening  12/28/1999  . TETANUS/TDAP  01/23/2018    There are no preventive care reminders to display for this patient.   Lab Results  Component Value Date   TSH 1.33 09/15/2016   Lab Results  Component Value Date   WBC 8.2 11/27/2017   HGB 14.8 11/27/2017   HCT 43.2 11/27/2017   MCV 86.2 11/27/2017   PLT 351 11/27/2017   Lab Results  Component Value Date   NA 138 11/27/2017   K 4.7 11/27/2017   CO2 26 11/27/2017   GLUCOSE 90 11/27/2017   BUN 12 11/27/2017   CREATININE 0.99 11/27/2017   BILITOT 0.6 11/27/2017   ALKPHOS 66 10/17/2016   AST 29 11/27/2017   ALT 34  11/27/2017   PROT 7.6 11/27/2017   ALBUMIN 4.4 10/17/2016   CALCIUM 9.8 11/27/2017   ANIONGAP 10 10/17/2016   Lab Results  Component Value Date   CHOL 197 11/27/2017   Lab Results  Component Value Date   HDL 37 (L) 11/27/2017   Lab Results  Component Value Date   LDLCALC 128 (H) 11/27/2017   Lab Results  Component Value Date   TRIG 182 (H) 11/27/2017   Lab Results  Component Value Date   CHOLHDL 5.3 (H) 11/27/2017   Lab Results  Component Value Date   HGBA1C 5.6 11/27/2017       Assessment & Plan:   Problem List Items Addressed This Visit    None    Visit Diagnoses    Dysuria    -  Primary   Relevant Medications   sulfamethoxazole-trimethoprim (BACTRIM DS,SEPTRA DS) 800-160 MG tablet   Other Relevant Orders   Urine cytology ancillary only   POCT urinalysis dipstick (Completed)   Urine Culture   Ambulatory referral to Urology   Acute prostatitis       Relevant Medications   sulfamethoxazole-trimethoprim (BACTRIM DS,SEPTRA DS) 800-160 MG tablet   Other Relevant Orders   Ambulatory referral to Urology     Prostate is equivocally tender, no CVA tenderness, and is not ill-appearing, VSS.  We will treat with Bactrim for prostatitis/cystitis while he awaits laboratory testing and appointment with urology - referral is placed.  Red flags for worsening prostatitis are discussed in detail including when to present to the ER. Pt verbalizes understanding.  Meds ordered this encounter  Medications  . sulfamethoxazole-trimethoprim (BACTRIM DS,SEPTRA DS) 800-160 MG tablet    Sig: Take  1 tablet by mouth 2 (two) times daily for 14 days.    Dispense:  28 tablet    Refill:  0    Order Specific Question:   Supervising Provider    Answer:   Alba Cory [[id number]]    Doren Custard, FNP

## 2018-03-19 LAB — URINE CULTURE
MICRO NUMBER:: 235185
SPECIMEN QUALITY:: ADEQUATE

## 2018-03-19 LAB — URINE CYTOLOGY ANCILLARY ONLY
Chlamydia: NEGATIVE
Neisseria Gonorrhea: NEGATIVE
Trichomonas: NEGATIVE

## 2018-04-23 ENCOUNTER — Ambulatory Visit: Payer: 59 | Admitting: Urology

## 2018-05-28 ENCOUNTER — Encounter: Payer: Self-pay | Admitting: Family Medicine

## 2018-05-28 ENCOUNTER — Ambulatory Visit: Payer: 59 | Admitting: Family Medicine

## 2018-05-28 ENCOUNTER — Other Ambulatory Visit: Payer: Self-pay

## 2018-05-28 VITALS — BP 112/70 | HR 86 | Temp 97.9°F | Resp 16 | Ht 74.0 in | Wt 256.4 lb

## 2018-05-28 DIAGNOSIS — G4709 Other insomnia: Secondary | ICD-10-CM | POA: Diagnosis not present

## 2018-05-28 DIAGNOSIS — Z23 Encounter for immunization: Secondary | ICD-10-CM | POA: Diagnosis not present

## 2018-05-28 DIAGNOSIS — K219 Gastro-esophageal reflux disease without esophagitis: Secondary | ICD-10-CM

## 2018-05-28 DIAGNOSIS — F325 Major depressive disorder, single episode, in full remission: Secondary | ICD-10-CM | POA: Diagnosis not present

## 2018-05-28 DIAGNOSIS — E669 Obesity, unspecified: Secondary | ICD-10-CM | POA: Diagnosis not present

## 2018-05-28 DIAGNOSIS — R69 Illness, unspecified: Secondary | ICD-10-CM | POA: Diagnosis not present

## 2018-05-28 MED ORDER — ESCITALOPRAM OXALATE 20 MG PO TABS: 20.0000 mg | ORAL_TABLET | Freq: Every day | ORAL | 1 refills | Status: DC

## 2018-05-28 NOTE — Progress Notes (Signed)
Name: Timothy Fleming   MRN: [id number]    DOB: July 10, 1985   Date:05/28/2018       Progress Note  Subjective  Chief Complaint  Chief Complaint  Patient presents with  . Medication Refill  . Depression  . Obesity  . Insomnia  . Gastroesophageal Reflux    HPI  Depression Major in Remission:  he is feeling much better, having more motivation and energy, working at his house , kitchen is done also finished bathroom. He is still contacting his high school friend, work is still stressful but has been good to be off for a few weeks ( secondary to COVID-19) taking Lexapro as prescribed and denies side effects  Obesity:He lost some weight since last visit, he states he has been busy working at his house and in-laws house.   GERD: under control with otc medication, no heartburn or regurgitation. He gets otc medication - PPI. Discussed long term use of PPI use but did not respond to H2 blocker Unchanged   Insomnia; taking Seroquelmost nights and is able to fall and stay asleep without any side effects. He still has medication at home and he will call when he is out   Pre-diabetes: reviewed labs with him today. Denies polyphagia, polyuria or polydipsia.   Patient Active Problem List   Diagnosis Date Noted  . Sprain of ankle 11/19/2017  . Metabolic syndrome 12/13/2016  . Moderate major depression (HCC) 11/30/2016  . History of vasculitis of skin 05/20/2015  . GERD without esophagitis 11/10/2014  . Seasonal allergic rhinitis 11/10/2014  . History of nephrolithiasis 11/10/2014  . Obesity (BMI 30-39.9) 11/10/2014  . Tobacco abuse 11/10/2014  . Snoring 11/10/2014  . Insomnia 11/10/2014    Past Surgical History:  Procedure Laterality Date  . FACIAL RECONSTRUCTION SURGERY  01/13/2008   After a car fell on him while working on it    Family History  Problem Relation Age of Onset  . Varicose Veins Mother   . Endometriosis Mother   . Drug abuse Father   . GI Disease Father   .  Heart attack Maternal Grandmother   . Breast cancer Maternal Grandmother   . Thyroid cancer Maternal Grandmother   . Congestive Heart Failure Maternal Grandfather   . Heart attack Maternal Grandfather   . Stroke Maternal Grandfather   . Breast cancer Paternal Grandmother   . Heart attack Paternal Grandfather     Social History   Socioeconomic History  . Marital status: Married    Spouse name: Cala Bradford   . Number of children: 1  . Years of education: Not on file  . Highest education level: Some college, no degree  Occupational History  . Not on file  Social Needs  . Financial resource strain: Not hard at all  . Food insecurity:    Worry: Never true    Inability: Never true  . Transportation needs:    Medical: No    Non-medical: No  Tobacco Use  . Smoking status: Former Smoker    Packs/day: 0.25    Years: 15.00    Pack years: 3.75    Types: Cigarettes, E-cigarettes    Start date: 11/10/1999    Last attempt to quit: 06/05/2017    Years since quitting: 0.9  . Smokeless tobacco: Never Used  . Tobacco comment: e cig mostly  Substance and Sexual Activity  . Alcohol use: No    Alcohol/week: 0.0 standard drinks  . Drug use: No  . Sexual activity: Yes  Partners: Female  Lifestyle  . Physical activity:    Days per week: 0 days    Minutes per session: 0 min  . Stress: Not at all  Relationships  . Social connections:    Talks on phone: More than three times a week    Gets together: More than three times a week    Attends religious service: Never    Active member of club or organization: Yes    Attends meetings of clubs or organizations: More than 4 times per year    Relationship status: Married  . Intimate partner violence:    Fear of current or ex partner: No    Emotionally abused: No    Physically abused: No    Forced sexual activity: No  Other Topics Concern  . Not on file  Social History Narrative   He is married has one son.    He was addicted to video  games, but is back playing guitar      Current Outpatient Medications:  .  escitalopram (LEXAPRO) 20 MG tablet, Take 1 tablet (20 mg total) by mouth daily., Disp: 90 tablet, Rfl: 1 .  omeprazole (PRILOSEC) 20 MG capsule, Take 20 mg by mouth daily., Disp: , Rfl:  .  QUEtiapine (SEROQUEL) 25 MG tablet, Take 1 tablet (25 mg total) by mouth at bedtime., Disp: 90 tablet, Rfl: 1  Allergies  Allergen Reactions  . Bee Venom Anaphylaxis, Shortness Of Breath and Swelling  . Penicillins Hives    vasculitis    I personally reviewed active problem list, medication list, allergies, family history, social history with the patient/caregiver today.   ROS  Constitutional: Negative for fever or weight change.  Respiratory: Negative for cough and shortness of breath.   Cardiovascular: Negative for chest pain or palpitations.  Gastrointestinal: Negative for abdominal pain, no bowel changes.  Musculoskeletal: Negative for gait problem or joint swelling.  Skin: Negative for rash.  Neurological: Negative for dizziness or headache.  No other specific complaints in a complete review of systems (except as listed in HPI above).  Objective  Vitals:   05/28/18 0913  BP: 112/70  Pulse: 86  Resp: 16  Temp: 97.9 F (36.6 C)  TempSrc: Oral  SpO2: 99%  Weight: 256 lb 6.4 oz (116.3 kg)  Height: 6\' 2"  (1.88 m)    Body mass index is 32.92 kg/m.  Physical Exam  Constitutional: Patient appears well-developed and well-nourished. Obese  No distress.  HEENT: head atraumatic, normocephalic, pupils equal and reactive to light, neck supple, throat within normal limits Cardiovascular: Normal rate, regular rhythm and normal heart sounds.  No murmur heard. No BLE edema. Pulmonary/Chest: Effort normal and breath sounds normal. No respiratory distress. Abdominal: Soft.  There is no tenderness. Psychiatric: Patient has a normal mood and affect. behavior is normal. Judgment and thought content normal.  Recent  Results (from the past 2160 hour(s))  Urine cytology ancillary only     Status: None   Collection Time: 03/18/18 12:00 AM  Result Value Ref Range   Chlamydia Negative     Comment: Normal Reference Range - Negative   Neisseria gonorrhea Negative     Comment: Normal Reference Range - Negative   Trichomonas Negative     Comment: Normal Reference Range - Negative  POCT urinalysis dipstick     Status: Abnormal   Collection Time: 03/18/18  9:43 AM  Result Value Ref Range   Color, UA yellow    Clarity, UA clear    Glucose,  UA Negative Negative   Bilirubin, UA negative    Ketones, UA negative    Spec Grav, UA <=1.005 (A) 1.010 - 1.025   Blood, UA trace    pH, UA 5.0 5.0 - 8.0   Protein, UA Positive (A) Negative   Urobilinogen, UA negative (A) 0.2 or 1.0 E.U./dL   Nitrite, UA negative    Leukocytes, UA Trace (A) Negative   Appearance clear    Odor none   Urine Culture     Status: None   Collection Time: 03/18/18 10:22 AM  Result Value Ref Range   MICRO NUMBER: [id number]    SPECIMEN QUALITY: Adequate    Sample Source URINE, CLEAN CATCH    STATUS: FINAL    ISOLATE 1:      Single organism less than 10,000 CFU/mL isolated. These organisms, commonly found on external and internal genitalia, are considered colonizers. No further testing performed.      PHQ2/9: Depression screen [facility] 2/9 05/28/2018 03/18/2018 01/08/2018 11/27/2017 10/17/2017  Decreased Interest 0 0 0 0 0  Down, Depressed, Hopeless 0 0 0 0 0  PHQ - 2 Score 0 0 0 0 0  Altered sleeping 0 2 3 0 0  Tired, decreased energy 0 0 3 0 0  Change in appetite 0 0 1 0 0  Feeling bad or failure about yourself  0 0 0 0 0  Trouble concentrating 0 0 0 0 0  Moving slowly or fidgety/restless 0 0 0 0 0  Suicidal thoughts 0 0 0 0 0  PHQ-9 Score 0 2 7 0 0  Difficult doing work/chores Not difficult at all Not difficult at all Not difficult at all Not difficult at all Not difficult at all  Some recent data might be hidden    phq 9 is  negative   Fall Risk: Fall Risk  05/28/2018 01/08/2018 11/27/2017 10/17/2017 06/19/2017  Falls in the past year? 1 1 0 No No  Number falls in past yr: 0 0 0 - -  Injury with Fall? 1 1 0 - -  Comment Hurt his right ankle - - - -     Functional Status Survey: Is the patient deaf or have difficulty hearing?: No Does the patient have difficulty seeing, even when wearing glasses/contacts?: Yes Does the patient have difficulty concentrating, remembering, or making decisions?: No Does the patient have difficulty walking or climbing stairs?: No Does the patient have difficulty dressing or bathing?: No Does the patient have difficulty doing errands alone such as visiting a doctor's office or shopping?: No    Assessment & Plan  1. Major depression in remission (HCC)  - escitalopram (LEXAPRO) 20 MG tablet; Take 1 tablet (20 mg total) by mouth daily.  Dispense: 90 tablet; Refill: 1  2. Other insomnia  He still has seroquel at home   3. Obesity (BMI 30-39.9)  Discussed with the patient the risk posed by an increased BMI. Discussed importance of portion control, calorie counting and at least 150 minutes of physical activity weekly. Avoid sweet beverages and drink more water. Eat at least 6 servings of fruit and vegetables daily   4. GERD without esophagitis  At goal   5. Need for Tdap vaccination  - Tdap vaccine greater than or equal to 7yo IM

## 2018-06-03 ENCOUNTER — Other Ambulatory Visit: Payer: Self-pay

## 2018-06-03 ENCOUNTER — Encounter: Payer: Self-pay | Admitting: Urology

## 2018-06-03 ENCOUNTER — Telehealth (INDEPENDENT_AMBULATORY_CARE_PROVIDER_SITE_OTHER): Payer: 59 | Admitting: Urology

## 2018-06-03 DIAGNOSIS — N200 Calculus of kidney: Secondary | ICD-10-CM | POA: Diagnosis not present

## 2018-06-03 NOTE — Progress Notes (Signed)
Virtual Visit Note  I connected with Timothy Fleming on 06/03/18 at 10:00 AM EDT by [email or web address] video app and verified that I am speaking with the correct person using two identifiers.   I discussed the limitations, risks, security and privacy concerns of performing an evaluation and management service by [email or web address] and the availability of in person appointments. We discussed the impact of the COVID-19 on the healthcare system, and the importance of social distancing and reducing patient and provider exposure. I also discussed with the patient that there may be a patient responsible charge related to this service. The patient expressed understanding and agreed to proceed.  Reason for visit: Dysuria/HX of nephrolithiasis  History of Present Illness: I saw Timothy Fleming via [email or web address] video app for virtual visit today for history of dysuria and nephrolithiasis.  He is a 33 year old healthy male who noted a few days of dysuria and groin pain and February 2020 which prompted this referral.  He was worked up with urinalysis and STD testing which were all negative.  There were "trace" RBCs on urinalysis.  He reports the symptoms resolved spontaneously.  He denies any symptoms at this time including flank pain, fever, dysuria, urinary frequency/urgency/weak stream, or gross hematuria.  His history is notable for 6-7 spontaneously passed kidney stones.  He has never required surgical intervention.  There is no recent cross-sectional imaging to review.  He is interested in learning more about stone prevention strategies.  Calcium was high normal at 9.8 in November 2019.  Assessment and Plan: In summary, the patient is a 33 year old male with extensive history of nephrolithiasis.  He is currently asymptomatic.  I recommended metabolic work-up with his high normal calcium and extensive history of stone disease at a young age.  We discussed general stone prevention strategies including adequate hydration with goal of  producing 2.5 L of urine daily, increasing citric acid intake, increasing calcium intake during high oxalate meals, minimizing animal protein, and decreasing salt intake.  Follow Up: RTC 1-2 months with PTH/calcium, and 24 hour urine collection results   I discussed the assessment and treatment plan with the patient. The patient was provided an opportunity to ask questions and all were answered. The patient agreed with the plan and demonstrated an understanding of the instructions.   The patient was advised to call back or seek an in-person evaluation if the symptoms worsen or if the condition fails to improve as anticipated.  I provided 20 minutes of non-face-to-face time during this encounter.   Sondra Come, MD

## 2018-06-05 ENCOUNTER — Telehealth: Payer: Self-pay

## 2018-06-05 NOTE — Telephone Encounter (Signed)
24 hour urine ordered, instructions mailed to patient

## 2018-06-05 NOTE — Telephone Encounter (Signed)
-----   Message from Sondra Come, MD sent at 06/03/2018 11:15 AM EDT ----- Regarding: follow up Was ultimately able to reach him via [email or web address] today.  Please set up follow up for 24 hour urine collection, calcium/PTH blood work, and review results in ~2 months.  Thanks Legrand Rams, MD 06/03/2018

## 2018-06-24 ENCOUNTER — Other Ambulatory Visit: Payer: 59

## 2018-06-24 ENCOUNTER — Other Ambulatory Visit: Payer: Self-pay

## 2018-06-24 DIAGNOSIS — N200 Calculus of kidney: Secondary | ICD-10-CM | POA: Diagnosis not present

## 2018-06-25 DIAGNOSIS — N200 Calculus of kidney: Secondary | ICD-10-CM | POA: Diagnosis not present

## 2018-06-27 ENCOUNTER — Other Ambulatory Visit: Payer: Self-pay | Admitting: Urology

## 2018-06-30 ENCOUNTER — Other Ambulatory Visit: Payer: Self-pay | Admitting: Family Medicine

## 2018-06-30 DIAGNOSIS — G4709 Other insomnia: Secondary | ICD-10-CM

## 2018-07-11 ENCOUNTER — Ambulatory Visit (INDEPENDENT_AMBULATORY_CARE_PROVIDER_SITE_OTHER): Payer: 59 | Admitting: Family Medicine

## 2018-07-11 ENCOUNTER — Telehealth: Payer: Self-pay | Admitting: Family Medicine

## 2018-07-11 ENCOUNTER — Telehealth: Payer: Self-pay | Admitting: *Deleted

## 2018-07-11 ENCOUNTER — Encounter: Payer: Self-pay | Admitting: Family Medicine

## 2018-07-11 ENCOUNTER — Other Ambulatory Visit: Payer: 59

## 2018-07-11 ENCOUNTER — Other Ambulatory Visit: Payer: Self-pay | Admitting: Family Medicine

## 2018-07-11 ENCOUNTER — Other Ambulatory Visit: Payer: Self-pay

## 2018-07-11 VITALS — Temp 97.0°F | Ht 74.0 in | Wt 255.0 lb

## 2018-07-11 DIAGNOSIS — M791 Myalgia, unspecified site: Secondary | ICD-10-CM

## 2018-07-11 DIAGNOSIS — R05 Cough: Secondary | ICD-10-CM | POA: Diagnosis not present

## 2018-07-11 DIAGNOSIS — J029 Acute pharyngitis, unspecified: Secondary | ICD-10-CM

## 2018-07-11 DIAGNOSIS — Z20822 Contact with and (suspected) exposure to covid-19: Secondary | ICD-10-CM

## 2018-07-11 DIAGNOSIS — R059 Cough, unspecified: Secondary | ICD-10-CM

## 2018-07-11 MED ORDER — MAGIC MOUTHWASH W/LIDOCAINE: 5.0000 mL | Freq: Four times a day (QID) | ORAL | 0 refills | Status: DC

## 2018-07-11 NOTE — Telephone Encounter (Signed)
Patient has sore throat, muscles aches and cough with a history of exposure to COVID-19 at work and needs to be cleared prior to going back    Pt scheduled for today at [facility]. Requested by Dr. Ancil Boozer at [facility] practice  Testing process reviewed; stay in car, wear mask as well as any passengers in vehicle. Pt verbalizes understanding. Pts CB# [phone]

## 2018-07-11 NOTE — Progress Notes (Signed)
Name: Timothy Fleming   MRN: [id number] Timothy Fleming   DOB: 04/15/1985   Date:07/11/2018       Progress Note  Subjective  Chief Complaint  Chief Complaint  Patient presents with  . Sore Throat    Yesterday-dysphagia, decreased appetite  . Muscle Pain    Upper body   . Cough    Dry cough     I connected with  Timothy Fleming  on 07/11/18 at  9:40 AM EDT by a telephone , enabled telemedicine application and verified that I am speaking with the correct person using two identifiers.  I discussed the limitations of evaluation and management by telemedicine and the availability of in person appointments. The patient expressed understanding and agreed to proceed. Staff also discussed with the patient that there may be a patient responsible charge related to this service. Patient Location: at home Provider Location: [facility]    HPI  Viral illness: he has to fill out a form for work prior to go to work and unable to work since yesterday because of sore throat, muscle aches, dry cough intermittently, no fever or chills. No nausea, vomiting or diarrhea. He also has bilateral cervical lymphadenopathy worse on the left side. No change in bowel movements, only mild dysphagia. He has noticed decrease in appetite. No rashes He works for the city of [place], he is required to be tested for COVID-19 before he returns to work    Patient Active Problem List   Diagnosis Date Noted  . Ankle instability 12/03/2017  . Sprain of ankle 11/19/2017  . Metabolic syndrome 12/13/2016  . Moderate major depression (HCC) 11/30/2016  . History of vasculitis of skin 05/20/2015  . GERD without esophagitis 11/10/2014  . Seasonal allergic rhinitis 11/10/2014  . History of nephrolithiasis 11/10/2014  . Obesity (BMI 30-39.9) 11/10/2014  . Tobacco abuse 11/10/2014  . Snoring 11/10/2014  . Insomnia 11/10/2014    Past Surgical History:  Procedure Laterality Date  . FACIAL RECONSTRUCTION SURGERY  01/13/2008   After a car  fell on him while working on it    Family History  Problem Relation Age of Onset  . Varicose Veins Mother   . Endometriosis Mother   . Drug abuse Father   . GI Disease Father   . Heart attack Maternal Grandmother   . Breast cancer Maternal Grandmother   . Thyroid cancer Maternal Grandmother   . Congestive Heart Failure Maternal Grandfather   . Heart attack Maternal Grandfather   . Stroke Maternal Grandfather   . Breast cancer Paternal Grandmother   . Heart attack Paternal Grandfather     Social History   Socioeconomic History  . Marital status: Married    Spouse name: Timothy Fleming   . Number of children: 1  . Years of education: Not on file  . Highest education level: Some college, no degree  Occupational History  . Not on file  Social Needs  . Financial resource strain: Not hard at all  . Food insecurity    Worry: Never true    Inability: Never true  . Transportation needs    Medical: No    Non-medical: No  Tobacco Use  . Smoking status: Former Smoker    Packs/day: 0.25    Years: 15.00    Pack years: 3.75    Types: Cigarettes, E-cigarettes    Start date: 11/10/1999    Quit date: 06/05/2017    Years since quitting: 1.0  . Smokeless tobacco: Never Used  . Tobacco  comment: e cig mostly  Substance and Sexual Activity  . Alcohol use: No    Alcohol/week: 0.0 standard drinks  . Drug use: No  . Sexual activity: Yes    Partners: Female  Lifestyle  . Physical activity    Days per week: 0 days    Minutes per session: 0 min  . Stress: Not at all  Relationships  . Social connections    Talks on phone: More than three times a week    Gets together: More than three times a week    Attends religious service: Never    Active member of club or organization: Yes    Attends meetings of clubs or organizations: More than 4 times per year    Relationship status: Married  . Intimate partner violence    Fear of current or ex partner: No    Emotionally abused: No    Physically  abused: No    Forced sexual activity: No  Other Topics Concern  . Not on file  Social History Narrative   He is married has one son.    He was addicted to video games, but is back playing guitar      Current Outpatient Medications:  .  escitalopram (LEXAPRO) 20 MG tablet, Take 1 tablet (20 mg total) by mouth daily., Disp: 90 tablet, Rfl: 1 .  omeprazole (PRILOSEC) 20 MG capsule, Take 20 mg by mouth daily., Disp: , Rfl:  .  QUEtiapine (SEROQUEL) 25 MG tablet, TAKE 1 TABLET(25 MG) BY MOUTH AT BEDTIME, Disp: 90 tablet, Rfl: 1  Allergies  Allergen Reactions  . Bee Venom Anaphylaxis, Shortness Of Breath and Swelling  . Penicillins Hives    vasculitis    I personally reviewed active problem list, medication list, allergies, family history with the patient/caregiver today.   ROS  Ten systems reviewed and is negative except as mentioned in HPI   Objective  At home  Vitals:   07/11/18 0935  Temp: (!) 97 F (36.1 C)    Body mass index is 32.74 kg/m.  Physical Exam   Awake , alert and oriented  PHQ2/9: Depression screen [facility] 2/9 07/11/2018 05/28/2018 03/18/2018 01/08/2018 11/27/2017  Decreased Interest 0 0 0 0 0  Down, Depressed, Hopeless 0 0 0 0 0  PHQ - 2 Score 0 0 0 0 0  Altered sleeping 0 0 2 3 0  Tired, decreased energy 0 0 0 3 0  Change in appetite 0 0 0 1 0  Feeling bad or failure about yourself  0 0 0 0 0  Trouble concentrating 0 0 0 0 0  Moving slowly or fidgety/restless 0 0 0 0 0  Suicidal thoughts 0 0 0 0 0  PHQ-9 Score 0 0 2 7 0  Difficult doing work/chores Not difficult at all Not difficult at all Not difficult at all Not difficult at all Not difficult at all  Some recent data might be hidden   PHQ-2/9 Result is negative.    Fall Risk: Fall Risk  07/11/2018 05/28/2018 01/08/2018 11/27/2017 10/17/2017  Falls in the past year? 1 1 1  0 No  Number falls in past yr: 0 0 0 0 -  Injury with Fall? 1 1 1  0 -  Comment Right ankle Hurt his right ankle - - -      Assessment & Plan  1. Sore throat  - magic mouthwash w/lidocaine SOLN; Take 5 mLs by mouth 4 (four) times daily.  Dispense: 200 mL; Refill: 0 It may  be strep, and if covid 19 negative but continues to have pain we will treat with antibiotics   2. Myalgia  We will check COVID-19 test advised to self quarantine until results are back   3. Cough  Mild   I discussed the assessment and treatment plan with the patient. The patient was provided an opportunity to ask questions and all were answered. The patient agreed with the plan and demonstrated an understanding of the instructions.  The patient was advised to call back or seek an in-person evaluation if the symptoms worsen or if the condition fails to improve as anticipated.  I provided 15  minutes of non-face-to-face time during this encounter.

## 2018-07-11 NOTE — Telephone Encounter (Signed)
Patient previously scheduled for appt on today, 07/11/18 at [facility] location.

## 2018-07-11 NOTE — Telephone Encounter (Signed)
RX REFILL: magic mouthwash w/lidocaine SOLN   Pharmacy:  [facility] [address] [place] [place], [place] [phone] (Phone) [phone] (Fax)

## 2018-07-11 NOTE — Telephone Encounter (Signed)
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

## 2018-07-13 ENCOUNTER — Encounter: Payer: Self-pay | Admitting: Family Medicine

## 2018-07-13 LAB — NOVEL CORONAVIRUS, NAA: SARS-CoV-2, NAA: NOT DETECTED

## 2018-07-14 ENCOUNTER — Encounter: Payer: Self-pay | Admitting: Family Medicine

## 2018-07-15 ENCOUNTER — Encounter: Payer: Self-pay | Admitting: Family Medicine

## 2018-07-15 MED ORDER — MAGIC MOUTHWASH W/LIDOCAINE: 5.0000 mL | Freq: Four times a day (QID) | ORAL | 0 refills | Status: DC

## 2018-07-16 ENCOUNTER — Telehealth: Payer: Self-pay

## 2018-07-16 NOTE — Telephone Encounter (Signed)
Copied from [place] [phone]. Topic: General - Other >> Jul 16, 2018  9:12 AM Timothy Fleming wrote: Reason for CRM: Patient would like doctors note uploaded to mychart to document his recent televisit. See mychart message from yesterday. Please call once it's uploaded.

## 2018-07-16 NOTE — Telephone Encounter (Signed)
Patient states he has no symptoms and feels like his symptoms were more allergy related. Also he was able to send his test results to his work and they approved him to go back tomorrow. Is tomorrow ok for him since he feels better instead of next Monday?

## 2018-07-16 NOTE — Telephone Encounter (Signed)
Copied from [place] [phone] [id number]. Topic: Appointment Scheduling - Scheduling Inquiry for Clinic >> Jul 15, 2018  9:27 AM Reyne Dumas L wrote: Reason for CRM:   Pt states he was supposed to call back and schedule an appointment after getting COVID test results.  Pt also needs a work note.  Tried office 3x.

## 2018-07-17 ENCOUNTER — Encounter: Payer: Self-pay | Admitting: Family Medicine

## 2018-07-17 NOTE — Telephone Encounter (Signed)
Letter has been written and sent through [place]. I also called the patient to inform him however, he was not available and voicemail was not set up.

## 2018-08-01 ENCOUNTER — Ambulatory Visit: Payer: 59 | Admitting: Urology

## 2018-08-08 ENCOUNTER — Encounter: Payer: Self-pay | Admitting: Urology

## 2018-08-08 ENCOUNTER — Other Ambulatory Visit: Payer: Self-pay

## 2018-08-08 ENCOUNTER — Ambulatory Visit: Payer: 59 | Admitting: Urology

## 2018-08-08 DIAGNOSIS — N200 Calculus of kidney: Secondary | ICD-10-CM | POA: Diagnosis not present

## 2018-08-08 NOTE — Patient Instructions (Signed)
Will call with PTH results, Follow up in one year with KUB

## 2018-08-08 NOTE — Progress Notes (Signed)
   08/08/2018 12:09 PM   Timothy Fleming Mar 22, 1985 [id number]  Reason for visit: Follow up recurrent nephrolithiasis  HPI: I saw Timothy Fleming in urology clinic today for follow-up of recurrent nephrolithiasis.  We originally had a virtual visit in May 2020 when he was having some mild dysuria.  This has since resolved.  His calcium is elevated at 10.1, however he has not yet obtained a PTH.  This was drawn in clinic today.  He denies any dysuria or flank pain since her last visit.  We reviewed his 24-hour urine results today.  Favorable findings were urine volume of 2.6 L, and elevated urinary citrate of 729.  Stone promoters included significantly elevated urinary calcium of 489, significantly elevated urine sodium of 262.  pH was 6.3.  He has never dropped off a stone for analysis.  We discussed general stone prevention strategies including adequate hydration with goal of producing 2.5 L of urine daily, ongoing citric acid intake, consuming a normal amount of calcium, minimizing animal protein, and most importantly decreasing salt intake. Information about dietary recommendations given today.   We will call with PTH results RTC 1 year with KUB, sooner if renal colic  A total of 15 minutes were spent face-to-face with the patient, greater than 50% was spent in patient education, counseling, and coordination of care regarding nephrolithiasis and stone prevention.   Billey Co, [facility] [address], [place], [place] [address] [phone]

## 2018-08-09 ENCOUNTER — Telehealth: Payer: Self-pay | Admitting: Urology

## 2018-08-09 LAB — PTH, INTACT AND CALCIUM
Calcium: 9.2 mg/dL (ref 8.7–10.2)
PTH: 38 pg/mL (ref 15–65)

## 2018-08-09 NOTE — Telephone Encounter (Signed)
Spoke with patient gave results Timothy Fleming will send out 24 hour UA order  [name]

## 2018-08-09 NOTE — Telephone Encounter (Signed)
-----   Message from Billey Co, MD sent at 08/09/2018 11:50 AM EDT ----- PTH was normal, he does not have hyperparathryoidism. He should continue to reduce salt in the diet, and drink plenty of fluids. Repeat 24 hour urine test before his follow up next year in 07/2019.  Thanks Nickolas Madrid, MD 08/09/2018

## 2018-10-24 ENCOUNTER — Other Ambulatory Visit: Payer: Self-pay

## 2018-10-24 ENCOUNTER — Ambulatory Visit
Admission: EM | Admit: 2018-10-24 | Discharge: 2018-10-24 | Disposition: A | Discharge status: Home / Self Care | Payer: 59

## 2018-10-24 DIAGNOSIS — R197 Diarrhea, unspecified: Secondary | ICD-10-CM

## 2018-10-24 DIAGNOSIS — R112 Nausea with vomiting, unspecified: Secondary | ICD-10-CM | POA: Diagnosis not present

## 2018-10-24 MED ORDER — ONDANSETRON 8 MG PO TBDP
8.0000 mg | ORAL_TABLET | Freq: Once | ORAL | Status: AC
  Administered 2018-10-24: 8 mg via ORAL

## 2018-10-24 MED ORDER — ONDANSETRON 4 MG PO TBDP: 4.0000 mg | ORAL_TABLET | Freq: Three times a day (TID) | ORAL | 0 refills | Status: DC | PRN

## 2018-10-24 NOTE — ED Provider Notes (Signed)
[facility] ____________________________________________  Time seen: Approximately 11:07 AM  I have reviewed the triage vital signs and the nursing notes.   HISTORY  Chief Complaint Emesis  HPI Timothy Fleming is a 33 y.o. male presenting for evaluation of nausea, vomiting and diarrhea.  Patient reports symptoms started Tuesday morning at which point he did started he had loose stool followed by vomiting.  States he has had multiple episodes of diarrhea daily with 2-5 episodes of vomiting for the last 2 days, no vomiting today but with continued nausea.  Denies accompanying fevers.  Denies abnormal colored stool or vomit, no black or bloody stool or vomit.  States generalized abdominal cramping discomfort.  Has been taken over-the-counter Pepto-Bismol without resolution.  States he did eat out on Monday and did not know if this was food related.  Denies others with similar.  Denies any accompanying cough, congestion, sore throat, chest pain or shortness of breath, changes in taste or smell or other accompanying symptoms.  Continues to tolerate fluids well, able to tolerate some foods but not a lot.  Reports otherwise doing well without other sickness.  Alba Cory, MD: PCP   Past Medical History:  Diagnosis Date  . Allergy    Seasonal  . GERD (gastroesophageal reflux disease)   . History of kidney stones    x4  . Insomnia     Patient Active Problem List   Diagnosis Date Noted  . Ankle instability 12/03/2017  . Sprain of ankle 11/19/2017  . Metabolic syndrome 12/13/2016  . Moderate major depression (HCC) 11/30/2016  . History of vasculitis of skin 05/20/2015  . GERD without esophagitis 11/10/2014  . Seasonal allergic rhinitis 11/10/2014  . History of nephrolithiasis 11/10/2014  . Obesity (BMI 30-39.9) 11/10/2014  . Tobacco abuse 11/10/2014  . Snoring 11/10/2014  . Insomnia 11/10/2014    Past Surgical History:  Procedure Laterality Date  . FACIAL  RECONSTRUCTION SURGERY  01/13/2008   After a car fell on him while working on it     No current facility-administered medications for this encounter.   Current Outpatient Medications:  .  escitalopram (LEXAPRO) 20 MG tablet, , Disp: , Rfl:  .  omeprazole (PRILOSEC) 20 MG capsule, Take 20 mg by mouth daily., Disp: , Rfl:  .  QUEtiapine (SEROQUEL) 25 MG tablet, TAKE 1 TABLET(25 MG) BY MOUTH AT BEDTIME, Disp: 90 tablet, Rfl: 1 .  ondansetron (ZOFRAN ODT) 4 MG disintegrating tablet, Take 1 tablet (4 mg total) by mouth every 8 (eight) hours as needed for nausea or vomiting., Disp: 15 tablet, Rfl: 0  Allergies Bee venom and Penicillins  Family History  Problem Relation Age of Onset  . Varicose Veins Mother   . Endometriosis Mother   . Drug abuse Father   . GI Disease Father   . Heart attack Maternal Grandmother   . Breast cancer Maternal Grandmother   . Thyroid cancer Maternal Grandmother   . Congestive Heart Failure Maternal Grandfather   . Heart attack Maternal Grandfather   . Stroke Maternal Grandfather   . Breast cancer Paternal Grandmother   . Heart attack Paternal Grandfather     Social History Social History   Tobacco Use  . Smoking status: Former Smoker    Packs/day: 0.25    Years: 15.00    Pack years: 3.75    Types: Cigarettes, E-cigarettes    Start date: 11/10/1999    Quit date: 06/05/2017    Years since quitting: 1.3  . Smokeless tobacco: Never  Used  . Tobacco comment: e cig mostly  Substance Use Topics  . Alcohol use: No    Alcohol/week: 0.0 standard drinks  . Drug use: No    Review of Systems Constitutional: No fever ENT: No sore throat. Cardiovascular: Denies chest pain. Respiratory: Denies shortness of breath. Gastrointestinal: As above.  Genitourinary: Negative for dysuria. Musculoskeletal: Negative for back pain. Skin: Negative for rash. ____________________________________________   PHYSICAL EXAM:  VITAL SIGNS: ED Triage Vitals  Enc  Vitals Group     BP 10/24/18 1049 120/84     Pulse Rate 10/24/18 1049 77     Resp 10/24/18 1049 16     Temp 10/24/18 1049 98.4 F (36.9 C)     Temp Source 10/24/18 1049 Oral     SpO2 10/24/18 1049 97 %     Weight 10/24/18 1048 260 lb (117.9 kg)     Height 10/24/18 1048 6\' 2"  (1.88 m)     Head Circumference --      Peak Flow --      Pain Score 10/24/18 1048 1     Pain Loc --      Pain Edu? --      Excl. in [place]? --     Constitutional: Alert and oriented. Well appearing and in no acute distress. Eyes: Conjunctivae are normal. ENT      Head: Normocephalic and atraumatic.      Mouth/Throat: Mucous membranes are moist. Cardiovascular: Normal rate, regular rhythm. Grossly normal heart sounds.  Good peripheral circulation. Respiratory: Normal respiratory effort without tachypnea nor retractions. Breath sounds are clear and equal bilaterally. No wheezes, rales, rhonchi. Gastrointestinal: Normal Bowel sounds.  Mild diffuse abdominal tenderness palpation, non-guarding, no point tenderness.  No CVA tenderness. Musculoskeletal: Steady gait. Neurologic:  Normal speech and language. Speech is normal. No gait instability.  Skin:  Skin is warm, dry and intact. No rash noted. Psychiatric: Mood and affect are normal. Speech and behavior are normal. Patient exhibits appropriate insight and judgment   ___________________________________________   LABS (all labs ordered are listed, but only abnormal results are displayed)  Labs Reviewed - No data to display  PROCEDURES Procedures    INITIAL IMPRESSION / ASSESSMENT AND PLAN / ED COURSE  Pertinent labs & imaging results that were available during my care of the patient were reviewed by me and considered in my medical decision making (see chart for details).  Overall well-appearing patient.  No acute distress.  Suspect viral versus food related.  8 mg ODT Zofran given once in urgent care.  Parents are fine at home.  Discussed over-the-counter  Imodium.  Discussed red follow-up and return parameters.  Supportive care.  Report given.Discussed indication, risks and benefits of medications with patient.  Discussed follow up with Primary care physician this week. Discussed follow up and return parameters including no resolution or any worsening concerns. Patient verbalized understanding and agreed to plan.   ____________________________________________   FINAL CLINICAL IMPRESSION(S) / ED DIAGNOSES  Final diagnoses:  Nausea vomiting and diarrhea     ED Discharge Orders         Ordered    ondansetron (ZOFRAN ODT) 4 MG disintegrating tablet  Every 8 hours PRN     10/24/18 1107           Note: This dictation was prepared with Dragon dictation along with smaller phrase technology. Any transcriptional errors that result from this process are unintentional.         Marylene Land, NP 10/24/18 1120

## 2018-10-24 NOTE — Discharge Instructions (Signed)
Take medication as prescribed. Rest. Drink plenty of fluids.  ° °Follow up with your primary care physician this week as needed. Return to Urgent care for new or worsening concerns.  ° °

## 2018-10-24 NOTE — ED Triage Notes (Signed)
Patient complains of nausea, vomiting and diarrhea since Tuesday with lower abdominal pain. Patient states that he has been able to keep down buttered noodles and water but no solid food. States that he took pepto without relief.

## 2018-10-28 ENCOUNTER — Encounter: Payer: Self-pay | Admitting: Family Medicine

## 2018-10-28 ENCOUNTER — Other Ambulatory Visit: Payer: Self-pay

## 2018-10-28 ENCOUNTER — Other Ambulatory Visit
Admission: RE | Admit: 2018-10-28 | Discharge: 2018-10-28 | Disposition: A | Discharge status: Home / Self Care | Payer: 59 | Source: Ambulatory Visit | Attending: Family Medicine | Admitting: Family Medicine

## 2018-10-28 ENCOUNTER — Ambulatory Visit (INDEPENDENT_AMBULATORY_CARE_PROVIDER_SITE_OTHER): Payer: 59 | Admitting: Family Medicine

## 2018-10-28 ENCOUNTER — Telehealth: Payer: Self-pay

## 2018-10-28 DIAGNOSIS — R197 Diarrhea, unspecified: Secondary | ICD-10-CM | POA: Diagnosis not present

## 2018-10-28 DIAGNOSIS — R110 Nausea: Secondary | ICD-10-CM

## 2018-10-28 LAB — CBC WITH DIFFERENTIAL/PLATELET
Abs Immature Granulocytes: 0.21 10*3/uL — ABNORMAL HIGH (ref 0.00–0.07)
Basophils Absolute: 0.1 10*3/uL (ref 0.0–0.1)
Basophils Relative: 1 %
Eosinophils Absolute: 0.2 10*3/uL (ref 0.0–0.5)
Eosinophils Relative: 2 %
HCT: 42.4 % (ref 39.0–52.0)
Hemoglobin: 14.4 g/dL (ref 13.0–17.0)
Immature Granulocytes: 2 %
Lymphocytes Relative: 26 %
Lymphs Abs: 2.6 10*3/uL (ref 0.7–4.0)
MCH: 29.6 pg (ref 26.0–34.0)
MCHC: 34 g/dL (ref 30.0–36.0)
MCV: 87.1 fL (ref 80.0–100.0)
Monocytes Absolute: 1.1 10*3/uL — ABNORMAL HIGH (ref 0.1–1.0)
Monocytes Relative: 11 %
Neutro Abs: 5.9 10*3/uL (ref 1.7–7.7)
Neutrophils Relative %: 58 %
Platelets: 315 10*3/uL (ref 150–400)
RBC: 4.87 MIL/uL (ref 4.22–5.81)
RDW: 12.5 % (ref 11.5–15.5)
WBC: 10.1 10*3/uL (ref 4.0–10.5)
nRBC: 0 % (ref 0.0–0.2)

## 2018-10-28 LAB — COMPREHENSIVE METABOLIC PANEL
ALT: 34 U/L (ref 0–44)
AST: 25 U/L (ref 15–41)
Albumin: 4.1 g/dL (ref 3.5–5.0)
Alkaline Phosphatase: 66 U/L (ref 38–126)
Anion gap: 9 (ref 5–15)
BUN: 10 mg/dL (ref 6–20)
CO2: 24 mmol/L (ref 22–32)
Calcium: 8.9 mg/dL (ref 8.9–10.3)
Chloride: 100 mmol/L (ref 98–111)
Creatinine, Ser: 1.13 mg/dL (ref 0.61–1.24)
GFR calc Af Amer: >60 mL/min (ref >60)
GFR calc non Af Amer: >60 mL/min (ref >60)
Glucose, Bld: 113 mg/dL — ABNORMAL HIGH (ref 70–99)
Potassium: 3.9 mmol/L (ref 3.5–5.1)
Sodium: 133 mmol/L — ABNORMAL LOW (ref 135–145)
Total Bilirubin: 0.6 mg/dL (ref 0.3–1.2)
Total Protein: 7.5 g/dL (ref 6.5–8.1)

## 2018-10-28 MED ORDER — DICYCLOMINE HCL 10 MG PO CAPS: 10.0000 mg | ORAL_CAPSULE | Freq: Three times a day (TID) | ORAL | 0 refills | Status: DC

## 2018-10-28 NOTE — Progress Notes (Signed)
Name: Timothy Fleming   MRN: [id number]    DOB: [birth year]   Date:10/28/2018       Progress Note  Subjective  Chief Complaint  Chief Complaint  Patient presents with  . Diarrhea    for 6 days, seen at [facility] on 10/1  . Emesis    I connected with  Earnestine Leys on 10/28/18 at  9:20 AM EDT by telephone and verified that I am speaking with the correct person using two identifiers.   I discussed the limitations, risks, security and privacy concerns of performing an evaluation and management service by telephone and the availability of in person appointments. Staff also discussed with the patient that there may be a patient responsible charge related to this service. Patient Location: Home Provider Location: Office Additional Individuals present: None  HPI  Pt presents to follow up on N/V/D.  Was seen at [facility] on 10/24/2018, was given zofran and discharged - advised likely viral at that time.  He has had symptoms since Tuesday 10/22/2018 - 6 days total illness.  He went to work today and was sent home because he is still symptomatic.  Since his UC visit he started feeling a bit better for a few days, appetite increased, had a few solid BM's, then today he started having diarrhea again, significant nausea without vomiting. Took zofran this morning and it is not helping much; having abdominal aching, but no localized pain.  Denies cough, sore throat, loss of taste/smell, headache, fevers, no body aches, blood in stool/dark and tarry stools; no known exposure to COVID-19.  Patient Active Problem List   Diagnosis Date Noted  . Ankle instability 12/03/2017  . Sprain of ankle 11/19/2017  . Metabolic syndrome 24/23/5361  . Moderate major depression ([place]) 11/30/2016  . History of vasculitis of skin 05/20/2015  . GERD without esophagitis 11/10/2014  . Seasonal allergic rhinitis 11/10/2014  . History of nephrolithiasis 11/10/2014  . Obesity (BMI 30-39.9) 11/10/2014  . Tobacco abuse 11/10/2014  .  Snoring 11/10/2014  . Insomnia 11/10/2014    Social History   Tobacco Use  . Smoking status: Former Smoker    Packs/day: 0.25    Years: 15.00    Pack years: 3.75    Types: Cigarettes, E-cigarettes    Start date: 11/10/1999    Quit date: 06/05/2017    Years since quitting: 1.3  . Smokeless tobacco: Never Used  . Tobacco comment: e cig mostly  Substance Use Topics  . Alcohol use: No    Alcohol/week: 0.0 standard drinks     Current Outpatient Medications:  .  escitalopram (LEXAPRO) 20 MG tablet, , Disp: , Rfl:  .  omeprazole (PRILOSEC) 20 MG capsule, Take 20 mg by mouth daily., Disp: , Rfl:  .  ondansetron (ZOFRAN ODT) 4 MG disintegrating tablet, Take 1 tablet (4 mg total) by mouth every 8 (eight) hours as needed for nausea or vomiting., Disp: 15 tablet, Rfl: 0 .  QUEtiapine (SEROQUEL) 25 MG tablet, TAKE 1 TABLET(25 MG) BY MOUTH AT BEDTIME, Disp: 90 tablet, Rfl: 1  Allergies  Allergen Reactions  . Bee Venom Anaphylaxis, Shortness Of Breath and Swelling  . Penicillins Hives    vasculitis    I personally reviewed active problem list, medication list, allergies, notes from last encounter, lab results with the patient/caregiver today.  ROS  Ten systems reviewed and is negative except as mentioned in HPI  Objective  Virtual encounter, vitals not obtained.  There is no height or weight on  file to calculate BMI.  Nursing Note and Vital Signs reviewed.  Physical Exam  Constitutional: Patient appears well-developed and well-nourished. No distress.  HENT: Head: Normocephalic and atraumatic.  Neck: Normal range of motion. Pulmonary/Chest: Effort normal. No respiratory distress. Speaking in complete sentences Neurological: Pt is alert and oriented to person, place, and time. Coordination, speech and gait are normal.  Psychiatric: Patient has a normal mood and affect. behavior is normal. Judgment and thought content normal. Abdominal: He self-palpates and denies any  tenderness. No difficulty getting in/out of his car today. No pain getting out of his bed this morning.   No results found for this or any previous visit (from the past 72 hour(s)).  Assessment & Plan  1. Diarrhea of presumed infectious origin - [facility] labs and GI panel kit pick up.  Discussed peritoneal/surgical abdomen signs and symptoms in detail - he denies, and is aware that he should go to the ER if these should occur.  - CBC with Differential/Platelet; Future - Comprehensive metabolic panel; Future - Gastrointestinal Panel by PCR , Stool; Future - dicyclomine (BENTYL) 10 MG capsule; Take 1 capsule (10 mg total) by mouth 4 (four) times daily -  before meals and at bedtime.  Dispense: 30 capsule; Refill: 0  2. Nausea - CBC with Differential/Platelet; Future - Comprehensive metabolic panel; Future - Gastrointestinal Panel by PCR , Stool; Future  -Red flags and when to present for emergency care or RTC including fever >101.96F, chest pain, shortness of breath, new/worsening/un-resolving symptoms, reviewed with patient at time of visit. Follow up and care instructions discussed and provided in AVS. - I discussed the assessment and treatment plan with the patient. The patient was provided an opportunity to ask questions and all were answered. The patient agreed with the plan and demonstrated an understanding of the instructions.  - The patient was advised to call back or seek an in-person evaluation if the symptoms worsen or if the condition fails to improve as anticipated.  I provided 16 minutes of non-face-to-face time during this encounter.  Hubbard Hartshorn, FNP

## 2018-10-28 NOTE — Telephone Encounter (Signed)
Please provide note for today and tomorrow.

## 2018-10-28 NOTE — Telephone Encounter (Signed)
Copied from [place] [id number]. Topic: General - Other >> Oct 28, 2018 11:15 AM Burchel, Abbi R wrote: Reason for CRM: Pt requesting work note be placed in his MyChart.

## 2018-10-28 NOTE — Telephone Encounter (Signed)
Copied from CRM #[id number]. Topic: General - Other >> Oct 28, 2018 11:15 AM Burchel, Abbi R wrote: Reason for CRM: Pt requesting work note be placed in his MyChart. 

## 2018-10-28 NOTE — Telephone Encounter (Signed)
Note at front desk.

## 2018-10-31 ENCOUNTER — Encounter: Payer: Self-pay | Admitting: Family Medicine

## 2018-10-31 LAB — GI PATHOGEN PANEL BY PCR, STOOL

## 2018-12-05 ENCOUNTER — Ambulatory Visit (INDEPENDENT_AMBULATORY_CARE_PROVIDER_SITE_OTHER): Payer: 59 | Admitting: Family Medicine

## 2018-12-05 ENCOUNTER — Other Ambulatory Visit: Payer: Self-pay

## 2018-12-05 ENCOUNTER — Encounter: Payer: Self-pay | Admitting: Family Medicine

## 2018-12-05 VITALS — BP 110/70 | HR 77 | Temp 97.5°F | Resp 16 | Ht 74.0 in | Wt 264.2 lb

## 2018-12-05 DIAGNOSIS — Z87828 Personal history of other (healed) physical injury and trauma: Secondary | ICD-10-CM | POA: Diagnosis not present

## 2018-12-05 DIAGNOSIS — M25562 Pain in left knee: Secondary | ICD-10-CM

## 2018-12-05 DIAGNOSIS — Z23 Encounter for immunization: Secondary | ICD-10-CM | POA: Diagnosis not present

## 2018-12-05 DIAGNOSIS — R69 Illness, unspecified: Secondary | ICD-10-CM | POA: Diagnosis not present

## 2018-12-05 DIAGNOSIS — E669 Obesity, unspecified: Secondary | ICD-10-CM

## 2018-12-05 DIAGNOSIS — E8881 Metabolic syndrome: Secondary | ICD-10-CM

## 2018-12-05 DIAGNOSIS — Z1159 Encounter for screening for other viral diseases: Secondary | ICD-10-CM

## 2018-12-05 DIAGNOSIS — M25362 Other instability, left knee: Secondary | ICD-10-CM

## 2018-12-05 DIAGNOSIS — E785 Hyperlipidemia, unspecified: Secondary | ICD-10-CM | POA: Diagnosis not present

## 2018-12-05 DIAGNOSIS — F325 Major depressive disorder, single episode, in full remission: Secondary | ICD-10-CM

## 2018-12-05 DIAGNOSIS — Z0000 Encounter for general adult medical examination without abnormal findings: Secondary | ICD-10-CM | POA: Diagnosis not present

## 2018-12-05 DIAGNOSIS — G4709 Other insomnia: Secondary | ICD-10-CM

## 2018-12-05 MED ORDER — ESCITALOPRAM OXALATE 20 MG PO TABS: 20.0000 mg | ORAL_TABLET | Freq: Every day | ORAL | 1 refills | Status: DC

## 2018-12-05 MED ORDER — QUETIAPINE FUMARATE 25 MG PO TABS: 25.0000 mg | ORAL_TABLET | Freq: Every day | ORAL | 1 refills | Status: DC

## 2018-12-05 MED ORDER — MELOXICAM 15 MG PO TABS: 15.0000 mg | ORAL_TABLET | Freq: Every day | ORAL | 0 refills | Status: DC

## 2018-12-05 NOTE — Progress Notes (Signed)
Name: Timothy Fleming   MRN: [id number]    DOB: Mar 22, 1985   Date:12/05/2018       Progress Note  Subjective  Chief Complaint  Chief Complaint  Patient presents with  . 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 He is still contacting his high school friend, he states job stress is down but now applying for a new position and is feeling anxious about getting a call back   Obesity:He gained about 9 lbs in the past 6 months, discussed importance of resuming life style modifications.   GERD: under control with otc medication, no heartburn or regurgitation. He gets otc medication - PPI. Discussed long term use of PPI, he is stable on medication   Insomnia; taking Seroquelmost nights and is able to fall and stay asleep without any side effects. We will send a refill   History of back injury: it happened at work a couple of months ago, he had PT and is back to baseline now  Left knee pain: he states he noticed lateral knee pain for a while, however had a recent fall, while at work ( one week ago) since than he has noticed worsening of pain, worse with movement, also noticed instability , no bruising or popping sensation, no redness or effusion.   Pre-diabetes: reviewed labs with him today.Denies polyphagia, polyuria or polydipsia.Explained importance of losing weight   USPSTF grade A and B recommendations:  Diet: eating fast food every day for lunch, cooking at home at night.  Exercise: only at work   Depression: phq 9 is negative Depression screen [facility] 2/9 12/05/2018 10/28/2018 07/11/2018 05/28/2018 03/18/2018  Decreased Interest 0 0 0 0 0  Down, Depressed, Hopeless 0 0 0 0 0  PHQ - 2 Score 0 0 0 0 0  Altered sleeping 0 0 0 0 2  Tired, decreased energy 0 0 0 0 0  Change in appetite 0 0 0 0 0   Feeling bad or failure about yourself  0 0 0 0 0  Trouble concentrating 0 0 0 0 0  Moving slowly or fidgety/restless 0 0 0 0 0  Suicidal thoughts 0 0 0 0 0  PHQ-9 Score 0 0 0 0 2  Difficult doing work/chores - Not difficult at all Not difficult at all Not difficult at all Not difficult at all  Some recent data might be hidden    Hypertension:  BP Readings from Last 3 Encounters:  12/05/18 110/70  10/24/18 120/84  08/08/18 134/68    Obesity: Wt Readings from Last 3 Encounters:  12/05/18 264 lb 3.2 oz (119.8 kg)  10/24/18 260 lb (117.9 kg)  08/08/18 260 lb (117.9 kg)   BMI Readings from Last 3 Encounters:  12/05/18 33.92 kg/m  10/24/18 33.38 kg/m  08/08/18 33.38 kg/m     Lipids:  Lab Results  Component Value Date   CHOL 197 11/27/2017   CHOL 160 09/15/2016   CHOL 191 11/04/2014   Lab Results  Component Value Date   HDL 37 (L) 11/27/2017   HDL 37 (L) 09/15/2016   HDL 37 11/04/2014   Lab Results  Component Value Date   LDLCALC 128 (H) 11/27/2017   LDLCALC 97 09/15/2016   LDLCALC 125 11/04/2014   Lab Results  Component Value Date  TRIG 182 (H) 11/27/2017   TRIG 132 09/15/2016   TRIG 145 11/04/2014   Lab Results  Component Value Date   CHOLHDL 5.3 (H) 11/27/2017   CHOLHDL 4.3 09/15/2016   No results found for: LDLDIRECT Glucose:  Glucose  Date Value Ref Range Status  11/18/2012 106 (H) 65 - 99 mg/dL Final  69/62/9528 93 65 - 99 mg/dL Final  41/32/4401 027 (H) 65 - 99 mg/dL Final   Glucose, Bld  Date Value Ref Range Status  10/28/2018 113 (H) 70 - 99 mg/dL Final  25/36/6440 90 65 - 99 mg/dL Final    Comment:    .            Fasting reference interval .   11/30/2016 88 65 - 139 mg/dL Final    Comment:    .        Non-fasting reference interval .       [facility] Visit from 10/28/2018 in [facility]  AUDIT-C Score  0      Married STD testing and prevention (HIV/chl/gon/syphilis): not interested  Hep C: today    Skin cancer: discussed atypical lesions  Colorectal cancer: N/A Prostate cancer: discussed USPTF   IPSS Questionnaire (AUA-7): Over the past month.   1)  How often have you had a sensation of not emptying your bladder completely after you finish urinating?  1 - Less than 1 time in 5  2)  How often have you had to urinate again less than two hours after you finished urinating? 1- Less than 1 times in 5   3)  How often have you found you stopped and started again several times when you urinated?  0 - Not at all  4) How difficult have you found it to postpone urination?  0 - Not at all  5) How often have you had a weak urinary stream?  1 - Less than 1 time in 5  6) How often have you had to push or strain to begin urination?  1 - Less than 1 time in 5  7) How many times did you most typically get up to urinate from the time you went to bed until the time you got up in the morning?  1 - 1 time  Total score:  0-7 mildly symptomatic   8-19 moderately symptomatic   20-35 severely symptomatic     Advanced Care Planning: A voluntary discussion about advance care planning including the explanation and discussion of advance directives.  Discussed health care proxy and Living will, and the patient was able to identify a health care proxy as wife   Patient does not know have a living will at present time.  Patient Active Problem List   Diagnosis Date Noted  . Ankle instability 12/03/2017  . Sprain of ankle 11/19/2017  . Metabolic syndrome 12/13/2016  . Moderate major depression (HCC) 11/30/2016  . History of vasculitis of skin 05/20/2015  . GERD without esophagitis 11/10/2014  . Seasonal allergic rhinitis 11/10/2014  . History of nephrolithiasis 11/10/2014  . Obesity (BMI 30-39.9) 11/10/2014  . Tobacco abuse 11/10/2014  . Snoring 11/10/2014  . Insomnia 11/10/2014    Past Surgical History:  Procedure Laterality Date  . FACIAL RECONSTRUCTION SURGERY  01/13/2008   After a car fell on him  while working on it    Family History  Problem Relation Age of Onset  . Varicose Veins Mother   . Endometriosis Mother   . Drug abuse Father   .  GI Disease Father   . Heart attack Maternal Grandmother   . Breast cancer Maternal Grandmother   . Thyroid cancer Maternal Grandmother   . Congestive Heart Failure Maternal Grandfather   . Heart attack Maternal Grandfather   . Stroke Maternal Grandfather   . Breast cancer Paternal Grandmother   . Heart attack Paternal Grandfather     Social History   Socioeconomic History  . Marital status: Married    Spouse name: Cala BradfordKimberly   . Number of children: 1  . Years of education: Not on file  . Highest education level: Some college, no degree  Occupational History  . Not on file  Social Needs  . Financial resource strain: Not hard at all  . Food insecurity    Worry: Never true    Inability: Never true  . Transportation needs    Medical: No    Non-medical: No  Tobacco Use  . Smoking status: Former Smoker    Packs/day: 0.25    Years: 15.00    Pack years: 3.75    Types: Cigarettes, E-cigarettes    Start date: 11/10/1999    Quit date: 06/05/2017    Years since quitting: 1.5  . Smokeless tobacco: Never Used  . Tobacco comment: e cig mostly  Substance and Sexual Activity  . Alcohol use: No    Alcohol/week: 0.0 standard drinks  . Drug use: No  . Sexual activity: Yes    Partners: Female  Lifestyle  . Physical activity    Days per week: 0 days    Minutes per session: 0 min  . Stress: Not at all  Relationships  . Social connections    Talks on phone: More than three times a week    Gets together: More than three times a week    Attends religious service: Never    Active member of club or organization: Yes    Attends meetings of clubs or organizations: More than 4 times per year    Relationship status: Married  . Intimate partner violence    Fear of current or ex partner: No    Emotionally abused: No    Physically abused: No     Forced sexual activity: No  Other Topics Concern  . Not on file  Social History Narrative   He is married has one son.    He was addicted to video games, but is back playing guitar      Current Outpatient Medications:  .  dicyclomine (BENTYL) 10 MG capsule, Take 1 capsule (10 mg total) by mouth 4 (four) times daily -  before meals and at bedtime., Disp: 30 capsule, Rfl: 0 .  escitalopram (LEXAPRO) 20 MG tablet, , Disp: , Rfl:  .  omeprazole (PRILOSEC) 20 MG capsule, Take 20 mg by mouth daily., Disp: , Rfl:  .  ondansetron (ZOFRAN ODT) 4 MG disintegrating tablet, Take 1 tablet (4 mg total) by mouth every 8 (eight) hours as needed for nausea or vomiting., Disp: 15 tablet, Rfl: 0 .  QUEtiapine (SEROQUEL) 25 MG tablet, TAKE 1 TABLET(25 MG) BY MOUTH AT BEDTIME, Disp: 90 tablet, Rfl: 1  Allergies  Allergen Reactions  . Bee Venom Anaphylaxis, Shortness Of Breath and Swelling  . Penicillins Hives    vasculitis     ROS  Constitutional: Negative for fever or weight change.  Respiratory: Negative for cough and shortness of breath.   Cardiovascular: Negative for chest pain or palpitations.  Gastrointestinal: Negative for abdominal pain, no bowel changes.  Musculoskeletal:  Negative for gait problem or joint swelling.  Skin: Negative for rash.  Neurological: Negative for dizziness or headache.  No other specific complaints in a complete review of systems (except as listed in HPI above).  Objective  Vitals:   12/05/18 0841  BP: 110/70  Pulse: 77  Resp: 16  Temp: (!) 97.5 F (36.4 C)  TempSrc: Temporal  SpO2: 99%  Weight: 264 lb 3.2 oz (119.8 kg)  Height: 6\' 2"  (1.88 m)    Body mass index is 33.92 kg/m.  Physical Exam  Constitutional: Patient appears well-developed and obese. No distress.  HENT: Head: Normocephalic and atraumatic. Ears: B TMs ok, no erythema or effusion; Nose: Nose normal. Mouth/Throat: Oropharynx is clear and moist. No oropharyngeal exudate.  Eyes:  Conjunctivae and EOM are normal. Pupils are equal, round, and reactive to light. No scleral icterus.  Neck: Normal range of motion. Neck supple. No JVD present. No thyromegaly present.  Cardiovascular: Normal rate, regular rhythm and normal heart sounds.  No murmur heard. No BLE edema. Pulmonary/Chest: Effort normal and breath sounds normal. No respiratory distress. Abdominal: Soft. Bowel sounds are normal, no distension. There is no tenderness. no masses MALE GENITALIA: Normal descended testes bilaterally, no masses palpated, no hernias, no lesions, no discharge RECTAL: Prostate normal size and consistency, no rectal masses or hemorrhoids  Musculoskeletal: Normal range of motion, no joint effusions. No gross deformities , tender during palpation of lateral knee, advised to roll IT band, try nsaids and call back for referral to ORtho if needed Neurological: he is alert and oriented to person, place, and time. No cranial nerve deficit. Coordination, balance, strength, speech and gait are normal.  Skin: Skin is warm and dry. No rash noted. No erythema.  Psychiatric: Patient has a normal mood and affect. behavior is normal. Judgment and thought content normal.  Recent Results (from the past 2160 hour(s))  Comprehensive metabolic panel     Status: Abnormal   Collection Time: 10/28/18 10:49 AM  Result Value Ref Range   Sodium 133 (L) 135 - 145 mmol/L   Potassium 3.9 3.5 - 5.1 mmol/L   Chloride 100 98 - 111 mmol/L   CO2 24 22 - 32 mmol/L   Glucose, Bld 113 (H) 70 - 99 mg/dL   BUN 10 6 - 20 mg/dL   Creatinine, Ser 1.13 0.61 - 1.24 mg/dL   Calcium 8.9 8.9 - 10.3 mg/dL   Total Protein 7.5 6.5 - 8.1 g/dL   Albumin 4.1 3.5 - 5.0 g/dL   AST 25 15 - 41 U/L   ALT 34 0 - 44 U/L   Alkaline Phosphatase 66 38 - 126 U/L   Total Bilirubin 0.6 0.3 - 1.2 mg/dL   GFR calc non Af Amer >60 >60 mL/min   GFR calc Af Amer >60 >60 mL/min   Anion gap 9 5 - 15    Comment: Performed at [facility], [address] [place], [place] [address]  CBC with Differential/Platelet     Status: Abnormal   Collection Time: 10/28/18 10:49 AM  Result Value Ref Range   WBC 10.1 4.0 - 10.5 K/uL   RBC 4.87 4.22 - 5.81 MIL/uL   Hemoglobin 14.4 13.0 - 17.0 g/dL   HCT 42.4 39.0 - 52.0 %   MCV 87.1 80.0 - 100.0 fL   MCH 29.6 26.0 - 34.0 pg   MCHC 34.0 30.0 - 36.0 g/dL   RDW 12.5 11.5 - 15.5 %   Platelets 315 150 - 400  K/uL   nRBC 0.0 0.0 - 0.2 %   Neutrophils Relative % 58 %   Neutro Abs 5.9 1.7 - 7.7 K/uL   Lymphocytes Relative 26 %   Lymphs Abs 2.6 0.7 - 4.0 K/uL   Monocytes Relative 11 %   Monocytes Absolute 1.1 (H) 0.1 - 1.0 K/uL   Eosinophils Relative 2 %   Eosinophils Absolute 0.2 0.0 - 0.5 K/uL   Basophils Relative 1 %   Basophils Absolute 0.1 0.0 - 0.1 K/uL   Immature Granulocytes 2 %   Abs Immature Granulocytes 0.21 (H) 0.00 - 0.07 K/uL    Comment: Performed at [facility], [address] [place], [place] [address]  GI pathogen panel by PCR, stool     Status: None   Collection Time: 10/28/18 10:52 AM  Result Value Ref Range   Plesiomonas shigelloides NOT DETECTED NOT DETECTED   Yersinia enterocolitica NOT DETECTED NOT DETECTED   Vibrio NOT DETECTED NOT DETECTED   Enteropathogenic E coli NOT DETECTED NOT DETECTED   E coli (ETEC) LT/ST NOT DETECTED NOT DETECTED   E coli 0157 by PCR Not applicable NOT DETECTED   Cryptosporidium by PCR NOT DETECTED NOT DETECTED   Entamoeba histolytica NOT DETECTED NOT DETECTED   Adenovirus F 40/41 NOT DETECTED NOT DETECTED   Norovirus GI/GII NOT DETECTED NOT DETECTED   Sapovirus NOT DETECTED NOT DETECTED    Comment: (NOTE) Performed At: [facility] [address] [place], [place] [id number] Jolene Schimke MD UX:[id number]    Vibrio cholerae NOT DETECTED NOT DETECTED   Campylobacter by PCR NOT DETECTED NOT DETECTED   Salmonella by PCR NOT DETECTED NOT DETECTED   E coli (STEC) NOT DETECTED NOT DETECTED   Enteroaggregative E coli  NOT DETECTED NOT DETECTED   Shigella by PCR NOT DETECTED NOT DETECTED   Cyclospora cayetanensis NOT DETECTED NOT DETECTED   Astrovirus NOT DETECTED NOT DETECTED   G lamblia by PCR NOT DETECTED NOT DETECTED   Rotavirus A by PCR NOT DETECTED NOT DETECTED     PHQ2/9: Depression screen [facility] 2/9 12/05/2018 10/28/2018 07/11/2018 05/28/2018 03/18/2018  Decreased Interest 0 0 0 0 0  Down, Depressed, Hopeless 0 0 0 0 0  PHQ - 2 Score 0 0 0 0 0  Altered sleeping 0 0 0 0 2  Tired, decreased energy 0 0 0 0 0  Change in appetite 0 0 0 0 0  Feeling bad or failure about yourself  0 0 0 0 0  Trouble concentrating 0 0 0 0 0  Moving slowly or fidgety/restless 0 0 0 0 0  Suicidal thoughts 0 0 0 0 0  PHQ-9 Score 0 0 0 0 2  Difficult doing work/chores - Not difficult at all Not difficult at all Not difficult at all Not difficult at all  Some recent data might be hidden     Fall Risk: Fall Risk  12/05/2018 10/28/2018 07/11/2018 05/28/2018 01/08/2018  Falls in the past year? 0 0 Number falls in past yr: 0 0 0 0 0  Injury with Fall? 0 0 Comment - - Right ankle Hurt his right ankle -  Follow up - Falls evaluation completed - - -     Functional Status Survey: Is the patient deaf or have difficulty hearing?: No Does the patient have difficulty seeing, even when wearing glasses/contacts?: No Does the patient have difficulty concentrating, remembering, or making decisions?: No Does the patient have difficulty walking  or climbing stairs?: Yes Does the patient have difficulty dressing or bathing?: No Does the patient have difficulty doing errands alone such as visiting a doctor's office or shopping?: No   Assessment & Plan  1. Need for immunization against influenza  - Flu Vaccine QUAD 36+ mos IM  2. Well adult exam  - Lipid panel - Hepatitis C antibody - Hemoglobin A1c - COMPLETE METABOLIC PANEL WITH GFR  3. Dyslipidemia  - Lipid panel  4. Obesity (BMI 30-39.9)  Discussed with  the patient the risk posed by an increased BMI. Discussed importance of portion control, calorie counting and at least 150 minutes of physical activity weekly. Avoid sweet beverages and drink more water. Eat at least 6 servings of fruit and vegetables daily   5. Major depression in remission (HCC)  Continue medication   6. History of back injury  workmans comp and resolved   7. Left lateral knee pain  - meloxicam (MOBIC) 15 MG tablet; Take 1 tablet (15 mg total) by mouth daily.  Dispense: 30 tablet; Refill: 0  8. Knee instability, left  We will refer him to Ortho if symptoms persists   9. Need for hepatitis C screening test  - Hepatitis C antibody  10. Metabolic syndrome  - Hemoglobin A1c  -Prostate cancer screening and PSA options (with potential risks and benefits of testing vs not testing) were discussed along with recent recs/guidelines. -USPSTF grade A and B recommendations reviewed with patient; age-appropriate recommendations, preventive care, screening tests, etc discussed and encouraged; healthy living encouraged; see AVS for patient education given to patient -Discussed importance of 150 minutes of physical activity weekly, eat two servings of fish weekly, eat one serving of tree nuts ( cashews, pistachios, pecans, almonds.Marland Kitchen) every other day, eat 6 servings of fruit/vegetables daily and drink plenty of water and avoid sweet beverages.  -Reviewed Health Maintenance

## 2018-12-05 NOTE — Patient Instructions (Signed)
Preventive Care 19-34 Years Old, Male Preventive care refers to lifestyle choices and visits with your health care provider that can promote health and wellness. This includes:  A yearly physical exam. This is also called an annual well check.  Regular dental and eye exams.  Immunizations.  Screening for certain conditions.  Healthy lifestyle choices, such as eating a healthy diet, getting regular exercise, not using drugs or products that contain nicotine and tobacco, and limiting alcohol use. What can I expect for my preventive care visit? Physical exam Your health care provider will check:  Height and weight. These may be used to calculate body mass index (BMI), which is a measurement that tells if you are at a healthy weight.  Heart rate and blood pressure.  Your skin for abnormal spots. Counseling Your health care provider may ask you questions about:  Alcohol, tobacco, and drug use.  Emotional well-being.  Home and relationship well-being.  Sexual activity.  Eating habits.  Work and work Statistician. What immunizations do I need?  Influenza (flu) vaccine  This is recommended every year. Tetanus, diphtheria, and pertussis (Tdap) vaccine  You may need a Td booster every 10 years. Varicella (chickenpox) vaccine  You may need this vaccine if you have not already been vaccinated. Human papillomavirus (HPV) vaccine  If recommended by your health care provider, you may need three doses over 6 months. Measles, mumps, and rubella (MMR) vaccine  You may need at least one dose of MMR. You may also need a second dose. Meningococcal conjugate (MenACWY) vaccine  One dose is recommended if you are 45-76 years old and a Market researcher living in a residence hall, or if you have one of several medical conditions. You may also need additional booster doses. Pneumococcal conjugate (PCV13) vaccine  You may need this if you have certain conditions and were not  previously vaccinated. Pneumococcal polysaccharide (PPSV23) vaccine  You may need one or two doses if you smoke cigarettes or if you have certain conditions. Hepatitis A vaccine  You may need this if you have certain conditions or if you travel or work in places where you may be exposed to hepatitis A. Hepatitis B vaccine  You may need this if you have certain conditions or if you travel or work in places where you may be exposed to hepatitis B. Haemophilus influenzae type b (Hib) vaccine  You may need this if you have certain risk factors. You may receive vaccines as individual doses or as more than one vaccine together in one shot (combination vaccines). Talk with your health care provider about the risks and benefits of combination vaccines. What tests do I need? Blood tests  Lipid and cholesterol levels. These may be checked every 5 years starting at age 17.  Hepatitis C test.  Hepatitis B test. Screening   Diabetes screening. This is done by checking your blood sugar (glucose) after you have not eaten for a while (fasting).  Sexually transmitted disease (STD) testing. Talk with your health care provider about your test results, treatment options, and if necessary, the need for more tests. Follow these instructions at home: Eating and drinking   Eat a diet that includes fresh fruits and vegetables, whole grains, lean protein, and low-fat dairy products.  Take vitamin and mineral supplements as recommended by your health care provider.  Do not drink alcohol if your health care provider tells you not to drink.  If you drink alcohol: ? Limit how much you have to 0-2  drinks a day. ? Be aware of how much alcohol is in your drink. In the [place] one drink equals one 12 oz bottle of beer (355 mL), one 5 oz glass of wine (148 mL), or one 1 oz glass of hard liquor (44 mL). Lifestyle  Take daily care of your teeth and gums.  Stay active. Exercise for at least 30 minutes on 5 or  more days each week.  Do not use any products that contain nicotine or tobacco, such as cigarettes, e-cigarettes, and chewing tobacco. If you need help quitting, ask your health care provider.  If you are sexually active, practice safe sex. Use a condom or other form of protection to prevent STIs (sexually transmitted infections). What's next?  Go to your health care provider once a year for a well check visit.  Ask your health care provider how often you should have your eyes and teeth checked.  Stay up to date on all vaccines. This information is not intended to replace advice given to you by your health care provider. Make sure you discuss any questions you have with your health care provider. Document Released: 03/07/2001 Document Revised: 01/03/2018 Document Reviewed: 01/03/2018 Elsevier Patient Education  2020 Elsevier Inc.  

## 2018-12-06 LAB — COMPLETE METABOLIC PANEL WITH GFR
AG Ratio: 1.6 (calc) (ref 1.0–2.5)
ALT: 35 U/L (ref 9–46)
AST: 26 U/L (ref 10–40)
Albumin: 4.6 g/dL (ref 3.6–5.1)
Alkaline phosphatase (APISO): 67 U/L (ref 36–130)
BUN: 8 mg/dL (ref 7–25)
CO2: 23 mmol/L (ref 20–32)
Calcium: 9.9 mg/dL (ref 8.6–10.3)
Chloride: 105 mmol/L (ref 98–110)
Creat: 1.11 mg/dL (ref 0.60–1.35)
GFR, Est African American: 101 mL/min/{1.73_m2} (ref >=60)
GFR, Est Non African American: 87 mL/min/{1.73_m2} (ref >=60)
Globulin: 2.9 g/dL (calc) (ref 1.9–3.7)
Glucose, Bld: 103 mg/dL — ABNORMAL HIGH (ref 65–99)
Potassium: 4.6 mmol/L (ref 3.5–5.3)
Sodium: 138 mmol/L (ref 135–146)
Total Bilirubin: 0.5 mg/dL (ref 0.2–1.2)
Total Protein: 7.5 g/dL (ref 6.1–8.1)

## 2018-12-06 LAB — LIPID PANEL
Cholesterol: 196 mg/dL (ref <200)
HDL: 32 mg/dL — ABNORMAL LOW (ref >=40)
LDL Cholesterol (Calc): 113 mg/dL (calc) — ABNORMAL HIGH
Non-HDL Cholesterol (Calc): 164 mg/dL (calc) — ABNORMAL HIGH (ref <130)
Total CHOL/HDL Ratio: 6.1 (calc) — ABNORMAL HIGH (ref <5.0)
Triglycerides: 360 mg/dL — ABNORMAL HIGH (ref <150)

## 2018-12-06 LAB — HEPATITIS C ANTIBODY
Hepatitis C Ab: NONREACTIVE
SIGNAL TO CUT-OFF: 0.01 (ref <1.00)

## 2018-12-06 LAB — HEMOGLOBIN A1C
Hgb A1c MFr Bld: 5.5 % of total Hgb (ref <5.7)
Mean Plasma Glucose: 111 (calc)
eAG (mmol/L): 6.2 (calc)

## 2019-01-02 ENCOUNTER — Other Ambulatory Visit: Payer: Self-pay

## 2019-01-02 ENCOUNTER — Ambulatory Visit (INDEPENDENT_AMBULATORY_CARE_PROVIDER_SITE_OTHER): Payer: 59 | Admitting: Family Medicine

## 2019-01-02 ENCOUNTER — Ambulatory Visit (INDEPENDENT_AMBULATORY_CARE_PROVIDER_SITE_OTHER): Payer: 59

## 2019-01-02 ENCOUNTER — Encounter: Payer: Self-pay | Admitting: Family Medicine

## 2019-01-02 ENCOUNTER — Ambulatory Visit
Admission: EM | Admit: 2019-01-02 | Discharge: 2019-01-02 | Disposition: A | Discharge status: Home / Self Care | Payer: 59 | Attending: Urgent Care | Admitting: Urgent Care

## 2019-01-02 VITALS — Wt 264.0 lb

## 2019-01-02 DIAGNOSIS — Z20822 Contact with and (suspected) exposure to covid-19: Secondary | ICD-10-CM

## 2019-01-02 DIAGNOSIS — Z20828 Contact with and (suspected) exposure to other viral communicable diseases: Secondary | ICD-10-CM

## 2019-01-02 DIAGNOSIS — R05 Cough: Secondary | ICD-10-CM

## 2019-01-02 DIAGNOSIS — R0789 Other chest pain: Secondary | ICD-10-CM

## 2019-01-02 DIAGNOSIS — J069 Acute upper respiratory infection, unspecified: Secondary | ICD-10-CM | POA: Diagnosis not present

## 2019-01-02 DIAGNOSIS — R0989 Other specified symptoms and signs involving the circulatory and respiratory systems: Secondary | ICD-10-CM | POA: Diagnosis not present

## 2019-01-02 MED ORDER — HYDROCOD POLST-CPM POLST ER 10-8 MG/5ML PO SUER: 5.0000 mL | Freq: Two times a day (BID) | ORAL | 0 refills | Status: DC | PRN

## 2019-01-02 NOTE — ED Triage Notes (Signed)
Patient complains of sinus congestion, cough, body aches, fatigue, body aches, chest pain worse with taking breath. Patient states that he did a teledoc visit today and was advised to come to an UC for testing as well as a possible chest x-ray.

## 2019-01-02 NOTE — Discharge Instructions (Signed)
It was very nice seeing you today in clinic. Thank you for entrusting me with your care.   Rest and stay hydrated. Use cough medication as prescribed, it will also help with your pleuritic chest pain. May use Tylenol and/or Ibuprofen as needed for pain/fever.    You were tested for SARS-CoV-2 (novel coronavirus) today. Testing is performed by an outside lab ([facility]) and has variable turn around times ranging between 2-5 days. Current recommendations from the the CDC and [place] DHHS require that you remain out of work in order to quarantine at home until negative test results are have been received. In the event that your test results are positive, you will be contacted with further directives. These measures are being implemented out of an abundance of caution to prevent transmission and spread during the current SARS-CoV-2 pandemic.  Make arrangements to follow up with your regular doctor in 1 week for re-evaluation if not improving. If your symptoms/condition worsens, please seek follow up care either here or in the ER. Please remember, our [place] providers are "right here with you" when you need [place].   Again, it was my pleasure to take care of you today. Thank you for choosing our clinic. I hope that you start to feel better quickly.   Honor Loh, MSN, APRN, FNP-C, CEN Advanced Practice Provider [facility]

## 2019-01-02 NOTE — Progress Notes (Signed)
Name: Timothy Fleming   MRN: [id number]    DOB: May 25, 1985   Date:01/02/2019       Progress Note  Subjective:    Chief Complaint  Chief Complaint  Patient presents with  . URI    onset 3 days symptoms:headache, sinus, chest pressure, sore throat and cough    I connected with  [name]  on 01/02/19 at  3:20 PM EST by a video enabled telemedicine application and verified that I am speaking with the correct person using two identifiers.  I discussed the limitations of evaluation and management by telemedicine and the availability of in person appointments. The patient expressed understanding and agreed to proceed. Staff also discussed with the patient that there may be a patient responsible charge related to this service. Patient Location: home Provider Location: [facility] clinic Additional Individuals present: none  URI  This is a new problem. Episode onset: 2 days ago. Associated symptoms include chest pain (pressure), congestion, coughing, nausea, rhinorrhea, a sore throat and vomiting. Pertinent negatives include no abdominal pain or sinus pain.    Feels really heavy in his chest, dry cough, feels really "foggy headed", very congested.   Chest feels like someone is standing on his chest When he coughs it feels like his lungs are bruised. No fever, he's been having sweats, been "clammy" with hot/cold chills.    Patient Active Problem List   Diagnosis Date Noted  . Ankle instability 12/03/2017  . Sprain of ankle 11/19/2017  . Metabolic syndrome 12/13/2016  . Moderate major depression (HCC) 11/30/2016  . History of vasculitis of skin 05/20/2015  . GERD without esophagitis 11/10/2014  . Seasonal allergic rhinitis 11/10/2014  . History of nephrolithiasis 11/10/2014  . Obesity (BMI 30-39.9) 11/10/2014  . Tobacco abuse 11/10/2014  . Snoring 11/10/2014  . Insomnia 11/10/2014    Social History   Tobacco Use  . Smoking status: Former Smoker    Packs/day: 0.25    Years:  15.00    Pack years: 3.75    Types: Cigarettes, E-cigarettes    Start date: 11/10/1999    Quit date: 06/05/2017    Years since quitting: 1.5  . Smokeless tobacco: Never Used  . Tobacco comment: e cig mostly  Substance Use Topics  . Alcohol use: No    Alcohol/week: 0.0 standard drinks     Current Outpatient Medications:  .  escitalopram (LEXAPRO) 20 MG tablet, Take 1 tablet (20 mg total) by mouth daily., Disp: 90 tablet, Rfl: 1 .  meloxicam (MOBIC) 15 MG tablet, Take 1 tablet (15 mg total) by mouth daily., Disp: 30 tablet, Rfl: 0 .  omeprazole (PRILOSEC) 20 MG capsule, Take 20 mg by mouth daily., Disp: , Rfl:  .  QUEtiapine (SEROQUEL) 25 MG tablet, Take 1 tablet (25 mg total) by mouth at bedtime., Disp: 90 tablet, Rfl: 1  Allergies  Allergen Reactions  . Bee Venom Anaphylaxis, Shortness Of Breath and Swelling  . Penicillins Hives    vasculitis    I personally reviewed active problem list, medication list, allergies, family history, social history, health maintenance, notes from last encounter, lab results, imaging with the patient/caregiver today.   Review of Systems  Constitutional: Positive for chills, diaphoresis and malaise/fatigue.  HENT: Positive for congestion, nosebleeds, rhinorrhea and sore throat. Negative for sinus pain.   Eyes: Negative.   Respiratory: Positive for cough. Negative for hemoptysis, sputum production and shortness of breath.   Cardiovascular: Positive for chest pain (pressure). Negative for palpitations, orthopnea, claudication,  leg swelling and PND.  Gastrointestinal: Positive for nausea and vomiting. Negative for abdominal pain, blood in stool, heartburn and melena.  Genitourinary: Negative.   Musculoskeletal: Negative.   Skin: Negative.   Neurological: Negative.   Endo/Heme/Allergies: Negative.   Psychiatric/Behavioral: Negative.   All other systems reviewed and are negative.    Objective:   Virtual encounter, vitals limited, only able to  obtain the following Today's Vitals   01/02/19 1447  Weight: 264 lb (119.7 kg)   Body mass index is 33.9 kg/m. Nursing Note and Vital Signs reviewed.  Physical Exam Vitals and nursing note reviewed.  HENT:     Head:     Comments: Patient's phonation sounds as if he has nasal congestion can hear sniffing and clearing of his throat Pulmonary:     Comments: Intermittent coughing and clearing of his throat no audible tachypnea, respiratory distress, audible wheeze or stridor Able to speak in normal sentences Neurological:     Mental Status: He is alert.     PE limited by telephone encounter  No results found for this or any previous visit (from the past 72 hour(s)).  Assessment and Plan:     ICD-10-CM   1. Upper respiratory tract infection, unspecified type  J06.9    uri sx with chest pressure, dry, cough, chest pressure, other constitutional sx like H/C chills, sweats - pt to ER for assessment - he will go now  2. Chest pressure  R07.89    bronchitis? esophagitis secondary to Vomiting yesterday?  may be pneumonia?  he is going to get assessed - explained time sensitive nature to r/o bad stuff  3. Suspected COVID-19 virus infection  Z20.828    pt instructed to isolate as if he has COVID - to ER for CP/chest pressure - needs COVID test, work note given - pt verbalized understanding of cautions     -Pt advised to go to the ER, may be able to be assessed at [facility] in [place] - but if he had any near syncope, confusion, SOB I told him to call 911 or go to the ER - I discussed the assessment and treatment plan with the patient. The patient was provided an opportunity to ask questions and all were answered. The patient agreed with the plan and demonstrated an understanding of the instructions.  I provided 15 minutes of non-face-to-face time during this encounter.  Delsa Grana, PA-C 01/02/19 3:57 PM

## 2019-01-02 NOTE — ED Provider Notes (Signed)
[place], [place]   Name: Timothy Fleming DOB: 1985/02/08 MRN: [id number] CSN: [id number] PCP: Alba Cory, MD  Arrival date and time:  01/02/19 1643  Chief Complaint:  Cough   NOTE: Prior to seeing the patient today, I have reviewed the triage nursing documentation and vital signs. Clinical staff has updated patient's PMH/PSHx, current medication list, and drug allergies/intolerances to ensure comprehensive history available to assist in medical decision making.   History:   HPI: Timothy Fleming is a 33 y.o. male who presents today with complaints of cough, congestion, sore throat, and chest pressure that started approximately 2 days ago. Patient denies fevers, but has had some associated episodes of chills and sweating. Cough has been non-productive and is reported to be worse at night. Patient states, "when I cough it feels like my lungs are bruised". Throat is irritated secondary to PND and coughing. He has experienced nausea and vomiting, but no diarrhea or abdominal pain. He is eating and drinking well. Patient denies any perceived alterations to his sense of taste or smell. Patient denies being in close contact with anyone known to be ill; no one else is his home has experienced a similar constellation. He has not been tested for SARS-CoV-2 (novel coronavirus) in the past 14 days; last tested negative 2 months ago per his report. Patient has been vaccinated for influenza this season (11/2018). In efforts to conservatively manage his symptoms at home, the patient notes that he has used Claritin D and Alka Seltzer DM, which has not helped to improve his symptoms. Patient participated in a tele-health visit with his PCP Angelica Chessman, PA-C) this afternoon; notes reviewed. Concern for ABR vs. PNA and SARS-CoV-2. Patient advised to go to the emergency department or urgent care for CXR and viral testing.   Past Medical History:  Diagnosis Date  . Allergy    Seasonal  . GERD (gastroesophageal reflux  disease)   . History of kidney stones    x4  . Insomnia     Past Surgical History:  Procedure Laterality Date  . FACIAL RECONSTRUCTION SURGERY  01/13/2008   After a car fell on him while working on it    Family History  Problem Relation Age of Onset  . Varicose Veins Mother   . Endometriosis Mother   . Drug abuse Father   . GI Disease Father   . Heart attack Maternal Grandmother   . Breast cancer Maternal Grandmother   . Thyroid cancer Maternal Grandmother   . Congestive Heart Failure Maternal Grandfather   . Heart attack Maternal Grandfather   . Stroke Maternal Grandfather   . Breast cancer Paternal Grandmother   . Heart attack Paternal Grandfather     Social History   Tobacco Use  . Smoking status: Former Smoker    Packs/day: 0.25    Years: 15.00    Pack years: 3.75    Types: Cigarettes, E-cigarettes    Start date: 11/10/1999    Quit date: 06/05/2017    Years since quitting: 1.5  . Smokeless tobacco: Never Used  . Tobacco comment: e cig mostly  Substance Use Topics  . Alcohol use: No    Alcohol/week: 0.0 standard drinks  . Drug use: No    Patient Active Problem List   Diagnosis Date Noted  . Ankle instability 12/03/2017  . Sprain of ankle 11/19/2017  . Metabolic syndrome 12/13/2016  . Moderate major depression (HCC) 11/30/2016  . History of vasculitis of skin 05/20/2015  . GERD without esophagitis 11/10/2014  .  Seasonal allergic rhinitis 11/10/2014  . History of nephrolithiasis 11/10/2014  . Obesity (BMI 30-39.9) 11/10/2014  . Tobacco abuse 11/10/2014  . Snoring 11/10/2014  . Insomnia 11/10/2014    Home Medications:    Current Meds  Medication Sig  . escitalopram (LEXAPRO) 20 MG tablet Take 1 tablet (20 mg total) by mouth daily.  . meloxicam (MOBIC) 15 MG tablet Take 1 tablet (15 mg total) by mouth daily.  Marland Kitchen. omeprazole (PRILOSEC) 20 MG capsule Take 20 mg by mouth daily.  . QUEtiapine (SEROQUEL) 25 MG tablet Take 1 tablet (25 mg total) by mouth at  bedtime.    Allergies:   Bee venom and Penicillins  Review of Systems (ROS): Review of Systems  Constitutional: Positive for chills, diaphoresis and fatigue. Negative for fever.  HENT: Positive for congestion, postnasal drip, rhinorrhea and sore throat. Negative for ear pain, sinus pressure, sinus pain and sneezing.   Eyes: Negative for pain, discharge and redness.  Respiratory: Positive for cough. Negative for chest tightness, shortness of breath and wheezing.   Cardiovascular: Positive for chest pain (pressure; pleuritic ). Negative for palpitations.  Gastrointestinal: Positive for nausea and vomiting. Negative for abdominal pain and diarrhea.  Musculoskeletal: Negative for arthralgias, back pain, myalgias and neck pain.  Skin: Negative for color change, pallor and rash.  Neurological: Positive for headaches. Negative for dizziness, syncope and weakness.  Hematological: Negative for adenopathy.     Vital Signs: Today's Vitals   01/02/19 1706 01/02/19 1710 01/02/19 1909  BP:  (!) 117/58   Pulse:  82   Resp:  16   Temp:  98.2 F (36.8 C)   TempSrc:  Oral   SpO2:  98%   Weight: 264 lb (119.7 kg)    Height: 6\' 2"  (1.88 m)    PainSc: 6   6     Physical Exam: Physical Exam  Constitutional: He is oriented to person, place, and time and well-developed, well-nourished, and in no distress.  Acutely ill appearing; fatigued/listless.  HENT:  Head: Normocephalic and atraumatic.  Nose: Mucosal edema and rhinorrhea present. No sinus tenderness.  Mouth/Throat: Uvula is midline and mucous membranes are normal. Posterior oropharyngeal erythema (+) thick white PND present. No oropharyngeal exudate or posterior oropharyngeal edema.  Eyes: Pupils are equal, round, and reactive to light.  Cardiovascular: Normal rate, regular rhythm, normal heart sounds and intact distal pulses.  Pulmonary/Chest: Effort normal. He has rhonchi (scattered; clears with cough).  Cough. No increased WOB. SPO2 98%  on RA.  Musculoskeletal:     Cervical back: Normal range of motion and neck supple.  Lymphadenopathy:    He has no cervical adenopathy.  Neurological: He is alert and oriented to person, place, and time. Gait normal.  Skin: Skin is warm and dry. No rash noted. He is not diaphoretic.  Psychiatric: Mood, memory, affect and judgment normal.  Nursing note and vitals reviewed.   Urgent Care Treatments / Results:  LABS: PLEASE NOTE: all labs that were ordered this encounter are listed, however only abnormal results are displayed. Labs Reviewed  NOVEL CORONAVIRUS, NAA (HOSP ORDER, SEND-OUT TO REF LAB; TAT 18-24 HRS)    EKG: -None  RADIOLOGY: DG Chest 2 View  Result Date: 01/02/2019 CLINICAL DATA:  Congestion and body aches EXAM: CHEST - 2 VIEW COMPARISON:  January 08, 2018 FINDINGS: The heart size and mediastinal contours are within normal limits. Both lungs are clear. The visualized skeletal structures are unremarkable. IMPRESSION: No active cardiopulmonary disease. Electronically Signed   By: Scherry RanWei-Chen  [place]in Coupe M.D.   On: 01/02/2019 18:00    PROCEDURES: Procedures  MEDICATIONS RECEIVED THIS VISIT: Medications - No data to display  PERTINENT CLINICAL COURSE NOTES/UPDATES:   Initial Impression / Assessment and Plan / Urgent Care Course:  Pertinent labs & imaging results that were available during my care of the patient were personally reviewed by me and considered in my medical decision making (see lab/imaging section of note for values and interpretations).  Timothy Fleming is a 33 y.o. male who presents to [facility] today with complaints of Cough   Patient acutely ill appearing (non-toxic) appearing today in clinic. He is not in any acute distress. Presenting symptoms (see HPI) and exam as documented above. He presents with symptoms associated with SARS-CoV-2 (novel coronavirus). Discussed typical symptom constellation. Reviewed potential for infection and need for testing.  Patient amenable to being tested. SARS-CoV-2 swab collected by certified clinical staff. Discussed variable turn around times associated with testing, as swabs are being processed at [facility], and have been taking between 2-5 days to come back. He was advised to self quarantine, per [facility] DHHS guidelines, until negative results received.   Exam reassuring. He has no fever, tachypnea, or tachycardia. Pain in chest associated with deep inspiration. Radiographs of the chest revealed no acute cardiopulmonary process; no evidence of peribronchial thickening, areas of consolidation, or focal infiltrates. Will defer lab testing at this point. Presenting symptoms consistent with acute viral illness. Until ruled out with confirmatory lab testing, SARS-CoV-2 remains part of the differential. His testing is pending at this time. I discussed with him that his symptoms are felt to be viral in nature, thus antibiotics would not offer him any relief or improve his symptoms any faster than conservative symptomatic management. Given his cough and pleuritic chest pain, will send in supply of Tussionex for PRN use. He is aware that this medication can cause somnolence. Discussed supportive care measures at home during acute phase of illness. Patient to rest as much as possible. He was encouraged to ensure adequate hydration (water and ORS) to prevent dehydration and electrolyte derangements. Patient may use APAP and/or IBU on an as needed basis for pain/fever.    Current clinical condition warrants patient being out of work in order to quarantine while waiting for testing results. He was provided with the appropriate documentation to provide to his place of employment that will allow for him to RTW on 01/05/2019 with no restrictions. RTW is contingent on his SARS-CoV-2 test results being reviewed as negative. These measures are being implemented out of an abundance of caution to prevent transmission and spread during the current  SARS-CoV-2 pandemic.  Discussed follow up with primary care physician in 1 week for re-evaluation. I have reviewed the follow up and strict return precautions for any new or worsening symptoms. Patient is aware of symptoms that would be deemed urgent/emergent, and would thus require further evaluation either here or in the emergency department. At the time of discharge, he verbalized understanding and consent with the discharge plan as it was reviewed with him. All questions were fielded by provider and/or clinic staff prior to patient discharge.    Final Clinical Impressions / Urgent Care Diagnoses:   Final diagnoses:  Viral URI with cough  Suspected COVID-19 virus infection  Encounter for laboratory testing for COVID-19 virus    New Prescriptions:  [place] Controlled Substance Registry consulted? Yes, I have consulted the  Controlled Substances Registry for this patient, and feel the risk/benefit ratio today is  favorable for proceeding with this prescription for a controlled substance.  . Discussed use of controlled substance medication to treat his acute symptoms.  o Reviewed [place] STOP Act regulations  o Clinic does not refill controlled substances over the phone without face to face evaluation.  . Safety precautions reviewed.  o Medications should not be sold or taken with alcohol.  o Avoid use while working, driving, or operating heavy machinery.  o Side effects associated with the use of this particular medication reviewed. - Patient understands that this medication can cause CNS depression, increase his risk of falls, and even lead to overdose that may result in death, if used outside of the parameters that he and I discussed.  With all of this in mind, he knowingly accepts the risks and responsibilities associated with intended course of treatment, and elects to responsibly proceed as discussed.  Meds ordered this encounter  Medications  . chlorpheniramine-HYDROcodone (TUSSIONEX  PENNKINETIC ER) 10-8 MG/5ML SUER    Sig: Take 5 mLs by mouth every 12 (twelve) hours as needed for cough.    Dispense:  70 mL    Refill:  0    Recommended Follow up Care:  Patient encouraged to follow up with the following provider within the specified time frame, or sooner as dictated by the severity of his symptoms. As always, he was instructed that for any urgent/emergent care needs, he should seek care either here or in the emergency department for more immediate evaluation.  Follow-up Information    Alba Cory, MD In 1 week.   Specialty: Family Medicine Why: General reassessment of symptoms if not improving Contact information: [address] [place] [address] [phone]         NOTE: This note was prepared using Dragon dictation software along with smaller phrase technology. Despite my best ability to proofread, there is the potential that transcriptional errors may still occur from this process, and are completely unintentional.   Verlee Monte, NP 01/02/19 2333

## 2019-01-03 ENCOUNTER — Other Ambulatory Visit: Payer: Self-pay | Admitting: Family Medicine

## 2019-01-03 DIAGNOSIS — M25562 Pain in left knee: Secondary | ICD-10-CM

## 2019-01-03 LAB — NOVEL CORONAVIRUS, NAA (HOSP ORDER, SEND-OUT TO REF LAB; TAT 18-24 HRS): SARS-CoV-2, NAA: NOT DETECTED

## 2019-01-18 ENCOUNTER — Encounter: Payer: Self-pay | Admitting: Emergency Medicine

## 2019-01-18 ENCOUNTER — Ambulatory Visit
Admission: EM | Admit: 2019-01-18 | Discharge: 2019-01-18 | Disposition: A | Discharge status: Home / Self Care | Payer: Managed Care, Other (non HMO) | Attending: Internal Medicine | Admitting: Internal Medicine

## 2019-01-18 ENCOUNTER — Other Ambulatory Visit: Payer: Self-pay

## 2019-01-18 DIAGNOSIS — H6992 Unspecified Eustachian tube disorder, left ear: Secondary | ICD-10-CM | POA: Diagnosis not present

## 2019-01-18 DIAGNOSIS — H6982 Other specified disorders of Eustachian tube, left ear: Secondary | ICD-10-CM

## 2019-01-18 DIAGNOSIS — R42 Dizziness and giddiness: Secondary | ICD-10-CM

## 2019-01-18 MED ORDER — MECLIZINE HCL 25 MG PO TABS: 25.0000 mg | ORAL_TABLET | Freq: Three times a day (TID) | ORAL | 0 refills | Status: DC | PRN

## 2019-01-18 MED ORDER — FLUTICASONE PROPIONATE 50 MCG/ACT NA SUSP: 2.0000 | Freq: Every day | NASAL | 0 refills | Status: DC

## 2019-01-18 NOTE — ED Provider Notes (Signed)
[facility]    CSN: [id number] Arrival date & time: 01/18/19  1007      History   Chief Complaint Chief Complaint  Patient presents with  . Dizziness    HPI Timothy Fleming is a 33 y.o. male. who presents with mild L ear fullness that started 2 days ago, but today got worse and noticed being very off balance and room spinning if he turns in bed or his head. Denies nausea, tinnitus or decreased hearing. Had URI 12/10 and was seen here for this, but that has resolved.     Past Medical History:  Diagnosis Date  . Allergy    Seasonal  . GERD (gastroesophageal reflux disease)   . History of kidney stones    x4  . Insomnia     Patient Active Problem List   Diagnosis Date Noted  . Ankle instability 12/03/2017  . Sprain of ankle 11/19/2017  . Metabolic syndrome 02/58/5277  . Moderate major depression ([place]) 11/30/2016  . History of vasculitis of skin 05/20/2015  . GERD without esophagitis 11/10/2014  . Seasonal allergic rhinitis 11/10/2014  . History of nephrolithiasis 11/10/2014  . Obesity (BMI 30-39.9) 11/10/2014  . Tobacco abuse 11/10/2014  . Snoring 11/10/2014  . Insomnia 11/10/2014    Past Surgical History:  Procedure Laterality Date  . FACIAL RECONSTRUCTION SURGERY  01/13/2008   After a car fell on him while working on it       Home Medications    Prior to Admission medications   Medication Sig Start Date End Date Taking? Authorizing Provider  escitalopram (LEXAPRO) 20 MG tablet Take 1 tablet (20 mg total) by mouth daily. 12/05/18  Yes Sowles, Drue Stager, MD  omeprazole (PRILOSEC) 20 MG capsule Take 20 mg by mouth daily.   Yes [provider]  QUEtiapine (SEROQUEL) 25 MG tablet Take 1 tablet (25 mg total) by mouth at bedtime. 12/05/18  Yes Sowles, Drue Stager, MD  fluticasone (FLONASE) 50 MCG/ACT nasal spray Place 2 sprays into both nostrils daily. 01/18/19   Rodriguez-Southworth, Sunday Spillers, PA-C  meclizine (ANTIVERT) 25 MG tablet Take 1 tablet  (25 mg total) by mouth 3 (three) times daily as needed for dizziness. 01/18/19   Rodriguez-Southworth, Sunday Spillers, PA-C    Family History Family History  Problem Relation Age of Onset  . Varicose Veins Mother   . Endometriosis Mother   . Drug abuse Father   . GI Disease Father   . Heart attack Maternal Grandmother   . Breast cancer Maternal Grandmother   . Thyroid cancer Maternal Grandmother   . Congestive Heart Failure Maternal Grandfather   . Heart attack Maternal Grandfather   . Stroke Maternal Grandfather   . Breast cancer Paternal Grandmother   . Heart attack Paternal Grandfather     Social History Social History   Tobacco Use  . Smoking status: Former Smoker    Packs/day: 0.25    Years: 15.00    Pack years: 3.75    Types: Cigarettes, E-cigarettes    Start date: 11/10/1999    Quit date: 06/05/2017    Years since quitting: 1.6  . Smokeless tobacco: Never Used  . Tobacco comment: e cig mostly  Substance Use Topics  . Alcohol use: No    Alcohol/week: 0.0 standard drinks  . Drug use: No     Allergies   Bee venom and Penicillins   Review of Systems Review of Systems  Constitutional: Positive for activity change. Negative for appetite change, chills, diaphoresis and fever.  HENT:  Negative for congestion, ear discharge, ear pain, facial swelling, postnasal drip, rhinorrhea, sinus pressure, sore throat and tinnitus.   Eyes: Negative for discharge.  Respiratory: Negative for cough and shortness of breath.   Cardiovascular: Negative for chest pain.  Gastrointestinal: Negative for nausea and vomiting.  Musculoskeletal: Positive for gait problem. Negative for arthralgias and neck pain.       Gait trouble due to dizziness  Skin: Negative for rash.  Neurological: Positive for dizziness. Negative for syncope, speech difficulty, weakness, light-headedness, numbness and headaches.  Hematological: Negative for adenopathy.     Physical Exam Triage Vital Signs ED Triage  Vitals  Enc Vitals Group     BP 01/18/19 1036 131/88     Pulse Rate 01/18/19 1036 76     Resp 01/18/19 1036 18     Temp 01/18/19 1036 98.3 F (36.8 C)     Temp Source 01/18/19 1036 Oral     SpO2 01/18/19 1036 98 %     Weight 01/18/19 1032 264 lb (119.7 kg)     Height 01/18/19 1032 6\' 1"  (1.854 m)     Head Circumference --      Peak Flow --      Pain Score 01/18/19 1032 1     Pain Loc --      Pain Edu? --      Excl. in [place]? --    No data found.  Updated Vital Signs BP 131/88 (BP Location: Right Arm)   Pulse 76   Temp 98.3 F (36.8 C) (Oral)   Resp 18   Ht 6\' 1"  (1.854 m)   Wt 264 lb (119.7 kg)   SpO2 98%   BMI 34.83 kg/m   Visual Acuity Right Eye Distance:   Left Eye Distance:   Bilateral Distance:    Right Eye Near:   Left Eye Near:    Bilateral Near:     Physical Exam Vitals and nursing note reviewed.  Constitutional:      General: He is not in acute distress.    Appearance: He is obese. He is not toxic-appearing.     Comments: Is sitting on a wheelchair  HENT:     Head: Atraumatic.     Right Ear: Tympanic membrane, ear canal and external ear normal. There is no impacted cerumen.     Left Ear: Ear canal and external ear normal. There is no impacted cerumen.     Ears:     Comments: L TM is a little dull    Nose: No congestion.     Mouth/Throat:     Mouth: Mucous membranes are moist.     Pharynx: No oropharyngeal exudate or posterior oropharyngeal erythema.  Eyes:     General: No scleral icterus.    Extraocular Movements: Extraocular movements intact.     Conjunctiva/sclera: Conjunctivae normal.     Pupils: Pupils are equal, round, and reactive to light.  Cardiovascular:     Rate and Rhythm: Normal rate and regular rhythm.     Heart sounds: No murmur.  Pulmonary:     Effort: Pulmonary effort is normal.     Breath sounds: Normal breath sounds.  Musculoskeletal:        General: Normal range of motion.     Cervical back: Neck supple. No rigidity or  tenderness.  Skin:    General: Skin is dry.  Neurological:     Mental Status: He is alert and oriented to person, place, and time.  Cranial Nerves: No cranial nerve deficit.     Motor: No weakness.     Coordination: Coordination normal.     Deep Tendon Reflexes: Reflexes normal.     Comments: Had normal finger to nose, but was off balance when he tried the tandem gait. Romberg was normal.   Psychiatric:        Mood and Affect: Mood normal.        Behavior: Behavior normal.        Thought Content: Thought content normal.        Judgment: Judgment normal.    UC Treatments / Results  Labs (all labs ordered are listed, but only abnormal results are displayed) Labs Reviewed - No data to display  EKG   Radiology No results found.  Procedures Procedures (including critical care time)  Medications Ordered in UC Medications - No data to display  Initial Impression / Assessment and Plan / UC Course  I have reviewed the triage vital signs and the nursing notes. He was placed on Flonase and Meclezine x 1 week.  FU if he gets worse and cant go to work on Monday.   Final Clinical Impressions(s) / UC Diagnoses   Final diagnoses:  Vertigo  Dysfunction of left eustachian tube     Discharge Instructions     Come back Monday if you are not better if you need a note for work.  Use the nasal spray and Antivert tablet for 7 days. If the antivert makes you sleepy, do not take it before going to work or driving.    ED Prescriptions    Medication Sig Dispense Auth. Provider   meclizine (ANTIVERT) 25 MG tablet Take 1 tablet (25 mg total) by mouth 3 (three) times daily as needed for dizziness. 21 tablet Rodriguez-Southworth, Robin Petrakis, PA-C   fluticasone (FLONASE) 50 MCG/ACT nasal spray Place 2 sprays into both nostrils daily. 18.2 g Rodriguez-Southworth, Nettie ElmSylvia, PA-C     PDMP not reviewed this encounter.   Garey HamRodriguez-Southworth, Anaika Santillano, PA-C 01/18/19 1102

## 2019-01-18 NOTE — Discharge Instructions (Signed)
Come back Monday if you are not better if you need a note for work.  Use the nasal spray and Antivert tablet for 7 days. If the antivert makes you sleepy, do not take it before going to work or driving.

## 2019-01-18 NOTE — ED Triage Notes (Signed)
Pt c/o dizziness. Started about 2 days ago. He states that he noticed that doing things with his left side of his body triggers the dizziness and he has been having left ear pain and fullness. Pt states he has not taken his Seroquel since the dizziness started.

## 2019-01-20 ENCOUNTER — Ambulatory Visit
Admission: EM | Admit: 2019-01-20 | Discharge: 2019-01-20 | Disposition: A | Discharge status: Home / Self Care | Payer: Managed Care, Other (non HMO) | Attending: Internal Medicine | Admitting: Internal Medicine

## 2019-01-20 ENCOUNTER — Other Ambulatory Visit: Payer: Self-pay

## 2019-01-20 DIAGNOSIS — H6592 Unspecified nonsuppurative otitis media, left ear: Secondary | ICD-10-CM

## 2019-01-20 MED ORDER — GUAIFENESIN ER 600 MG PO TB12: 600.0000 mg | ORAL_TABLET | Freq: Two times a day (BID) | ORAL | 0 refills | Status: AC

## 2019-01-20 MED ORDER — DOXYCYCLINE HYCLATE 100 MG PO CAPS: 100.0000 mg | ORAL_CAPSULE | Freq: Two times a day (BID) | ORAL | 0 refills | Status: DC

## 2019-01-20 NOTE — ED Provider Notes (Signed)
[facility]    CSN: [id number] Arrival date & time: 01/20/19  0907      History   Chief Complaint Chief Complaint  Patient presents with  . Dizziness    HPI Timothy Fleming is a 33 y.o. male with no past medical history comes to urgent care on the second occasion for left ear fullness, dizziness and a feeling of the room spinning around him.  Patient was seen on 11/26 and prescribed meclizine.  Patient has been taking the meclizine with no improvement.  He continues to have left ear fullness, unsteadiness and feeling of the room spinning around him.  No runny nose or sore throat.  No cough or sputum production.  No exposures to sick persons.  No nausea or vomiting.  No diarrhea.  Patient denies any ringing in the ears.  No fever or chills.  HPI  Past Medical History:  Diagnosis Date  . Allergy    Seasonal  . GERD (gastroesophageal reflux disease)   . History of kidney stones    x4  . Insomnia     Patient Active Problem List   Diagnosis Date Noted  . Ankle instability 12/03/2017  . Sprain of ankle 11/19/2017  . Metabolic syndrome 12/13/2016  . Moderate major depression ([facility]) 11/30/2016  . History of vasculitis of skin 05/20/2015  . GERD without esophagitis 11/10/2014  . Seasonal allergic rhinitis 11/10/2014  . History of nephrolithiasis 11/10/2014  . Obesity (BMI 30-39.9) 11/10/2014  . Tobacco abuse 11/10/2014  . Snoring 11/10/2014  . Insomnia 11/10/2014    Past Surgical History:  Procedure Laterality Date  . FACIAL RECONSTRUCTION SURGERY  01/13/2008   After a car fell on him while working on it       Home Medications    Prior to Admission medications   Medication Sig Start Date End Date Taking? Authorizing Provider  escitalopram (LEXAPRO) 20 MG tablet Take 1 tablet (20 mg total) by mouth daily. 12/05/18  Yes Sowles, Danna Hefty, MD  fluticasone (FLONASE) 50 MCG/ACT nasal spray Place 2 sprays into both nostrils daily. 01/18/19  Yes  Rodriguez-Southworth, Nettie Elm, PA-C  meclizine (ANTIVERT) 25 MG tablet Take 1 tablet (25 mg total) by mouth 3 (three) times daily as needed for dizziness. 01/18/19  Yes Rodriguez-Southworth, Nettie Elm, PA-C  omeprazole (PRILOSEC) 20 MG capsule Take 20 mg by mouth daily.   Yes [provider]  QUEtiapine (SEROQUEL) 25 MG tablet Take 1 tablet (25 mg total) by mouth at bedtime. 12/05/18  Yes Sowles, Danna Hefty, MD  doxycycline (VIBRAMYCIN) 100 MG capsule Take 1 capsule (100 mg total) by mouth 2 (two) times daily. 01/20/19   Merrilee Jansky, MD  guaiFENesin (MUCINEX) 600 MG 12 hr tablet Take 1 tablet (600 mg total) by mouth 2 (two) times daily for 10 days. 01/20/19 01/30/19  Merrilee Jansky, MD    Family History Family History  Problem Relation Age of Onset  . Varicose Veins Mother   . Endometriosis Mother   . Drug abuse Father   . GI Disease Father   . Heart attack Maternal Grandmother   . Breast cancer Maternal Grandmother   . Thyroid cancer Maternal Grandmother   . Congestive Heart Failure Maternal Grandfather   . Heart attack Maternal Grandfather   . Stroke Maternal Grandfather   . Breast cancer Paternal Grandmother   . Heart attack Paternal Grandfather     Social History Social History   Tobacco Use  . Smoking status: Former Smoker    Packs/day: 0.25  Years: 15.00    Pack years: 3.75    Types: Cigarettes, E-cigarettes    Start date: 11/10/1999    Quit date: 06/05/2017    Years since quitting: 1.6  . Smokeless tobacco: Never Used  . Tobacco comment: e cig mostly  Substance Use Topics  . Alcohol use: No    Alcohol/week: 0.0 standard drinks  . Drug use: No     Allergies   Bee venom and Penicillins   Review of Systems Review of Systems  Constitutional: Negative for activity change, chills and fatigue.  HENT: Negative for congestion, ear discharge, ear pain, mouth sores, postnasal drip, sinus pressure, sinus pain, sore throat and tinnitus.   Eyes: Negative for  pain and itching.  Respiratory: Negative for cough, shortness of breath and wheezing.   Gastrointestinal: Negative for diarrhea, nausea and vomiting.  Musculoskeletal: Negative for arthralgias and joint swelling.  Skin: Negative.   Neurological: Positive for dizziness and light-headedness. Negative for facial asymmetry, weakness and headaches.     Physical Exam Triage Vital Signs ED Triage Vitals  Enc Vitals Group     BP 01/20/19 0949 131/77     Pulse Rate 01/20/19 0949 74     Resp 01/20/19 0949 16     Temp 01/20/19 0949 98.7 F (37.1 C)     Temp Source 01/20/19 0949 Oral     SpO2 01/20/19 0949 97 %     Weight 01/20/19 0948 264 lb (119.7 kg)     Height 01/20/19 0948 6\' 2"  (1.88 m)     Head Circumference --      Peak Flow --      Pain Score 01/20/19 0948 2     Pain Loc --      Pain Edu? --      Excl. in [place]? --    No data found.  Updated Vital Signs BP 131/77 (BP Location: Left Arm)   Pulse 74   Temp 98.7 F (37.1 C) (Oral)   Resp 16   Ht 6\' 2"  (1.88 m)   Wt 119.7 kg   SpO2 97%   BMI 33.90 kg/m   Visual Acuity Right Eye Distance:   Left Eye Distance:   Bilateral Distance:    Right Eye Near:   Left Eye Near:    Bilateral Near:     Physical Exam Constitutional:      General: He is not in acute distress.    Appearance: He is not ill-appearing.  HENT:     Ears:     Comments: Left middle ear effusion with erythematous tympanic membrane.  Tympanic membrane is intact.  No discharge.  No external ear or canal changes. Cardiovascular:     Rate and Rhythm: Normal rate and regular rhythm.     Pulses: Normal pulses.     Heart sounds: Normal heart sounds.  Pulmonary:     Effort: Pulmonary effort is normal.     Breath sounds: Normal breath sounds.  Abdominal:     General: Bowel sounds are normal.     Palpations: Abdomen is soft.  Musculoskeletal:        General: No swelling or signs of injury. Normal range of motion.     Cervical back: Normal range of motion. No  tenderness.     Right lower leg: No edema.  Lymphadenopathy:     Cervical: No cervical adenopathy.  Skin:    General: Skin is warm.     Capillary Refill: Capillary refill takes less than 2 seconds.  Neurological:     General: No focal deficit present.     Mental Status: He is alert and oriented to person, place, and time.  Psychiatric:        Mood and Affect: Mood normal.      UC Treatments / Results  Labs (all labs ordered are listed, but only abnormal results are displayed) Labs Reviewed - No data to display  EKG   Radiology No results found.  Procedures Procedures (including critical care time)  Medications Ordered in UC Medications - No data to display  Initial Impression / Assessment and Plan / UC Course  I have reviewed the triage vital signs and the nursing notes.  Pertinent labs & imaging results that were available during my care of the patient were reviewed by me and considered in my medical decision making (see chart for details).     1.  Left otitis media with middle ear effusion: Doxycycline 100 mg twice daily for 10 days Mucinex 600 mg twice daily for 10 days Continue meclizine use Patient is advised not to operate vehicles or machinery If patient symptoms worsen is advised to return to urgent care to be reevaluated. Final Clinical Impressions(s) / UC Diagnoses   Final diagnoses:  Left otitis media with effusion   Discharge Instructions   None    ED Prescriptions    Medication Sig Dispense Auth. Provider   doxycycline (VIBRAMYCIN) 100 MG capsule Take 1 capsule (100 mg total) by mouth 2 (two) times daily. 20 capsule Savanna Dooley, Britta MccreedyPhilip O, MD   guaiFENesin (MUCINEX) 600 MG 12 hr tablet Take 1 tablet (600 mg total) by mouth 2 (two) times daily for 10 days. 20 tablet Matilda Fleig, Britta MccreedyPhilip O, MD     PDMP not reviewed this encounter.   Merrilee JanskyLamptey, Reise Gladney O, MD 01/20/19 1045

## 2019-01-20 NOTE — ED Triage Notes (Signed)
Patient states that he has been having dizziness since Thursday. Patient states that he was seen on Saturday and Dxed with Vertigo given meclizine, states that he was told to return for any continued symptoms. States that he has had no improvement from meclizine.

## 2019-01-23 ENCOUNTER — Other Ambulatory Visit: Payer: Self-pay | Admitting: Family Medicine

## 2019-01-23 DIAGNOSIS — G4709 Other insomnia: Secondary | ICD-10-CM

## 2019-01-23 NOTE — Telephone Encounter (Signed)
Requested medication (s) are due for refill today: yes  Requested medication (s) are on the active medication list: yes  Last refill:  10/27/2018  Future visit scheduled: yes  Notes to clinic:  not delegated   Requested Prescriptions  Pending Prescriptions Disp Refills   QUEtiapine (SEROQUEL) 25 MG tablet [Pharmacy Med Name: QUETIAPINE 25MG  TABLETS] 90 tablet 1    Sig: TAKE 1 TABLET(25 MG) BY MOUTH AT BEDTIME      Not Delegated - Psychiatry:  Antipsychotics - Second Generation (Atypical) - quetiapine Failed - 01/23/2019  3:25 AM      Failed - This refill cannot be delegated      Passed - ALT in normal range and within 180 days    ALT  Date Value Ref Range Status  12/05/2018 35 9 - 46 U/L Final   SGPT (ALT)  Date Value Ref Range Status  04/16/2012 58 12 - 78 U/L Final          Passed - AST in normal range and within 180 days    AST  Date Value Ref Range Status  12/05/2018 26 10 - 40 U/L Final   SGOT(AST)  Date Value Ref Range Status  04/16/2012 28 15 - 37 Unit/L Final          Passed - Completed PHQ-2 or PHQ-9 in the last 360 days.      Passed - Last BP in normal range    BP Readings from Last 1 Encounters:  01/20/19 131/77          Passed - Valid encounter within last 6 months    Recent Outpatient Visits           3 weeks ago Upper respiratory tract infection, unspecified type   [facility] Delsa Grana, PA-C   1 month ago Well adult exam   [facility] Steele Sizer, MD   2 months ago Diarrhea of presumed infectious origin   [place], FNP   6 months ago Sore throat   [facility] Steele Sizer, MD   8 months ago Major depression in remission [facility])   [facility] Steele Sizer, MD       Future Appointments             In 4 months Ancil Boozer, Drue Stager, MD [facility], [place]   In 6 months Diamantina [place], Herbert Seta, [place]

## 2019-01-27 ENCOUNTER — Ambulatory Visit
Admission: EM | Admit: 2019-01-27 | Discharge: 2019-01-27 | Disposition: A | Discharge status: Home / Self Care | Payer: Managed Care, Other (non HMO) | Attending: Nurse Practitioner | Admitting: Nurse Practitioner

## 2019-01-27 ENCOUNTER — Encounter: Payer: Self-pay | Admitting: Emergency Medicine

## 2019-01-27 ENCOUNTER — Other Ambulatory Visit: Payer: Self-pay

## 2019-01-27 DIAGNOSIS — R42 Dizziness and giddiness: Secondary | ICD-10-CM | POA: Diagnosis not present

## 2019-01-27 DIAGNOSIS — H6692 Otitis media, unspecified, left ear: Secondary | ICD-10-CM | POA: Diagnosis not present

## 2019-01-27 MED ORDER — NEOMYCIN-POLYMYXIN-HC 3.5-10000-1 OT SUSP: 3.0000 [drp] | Freq: Three times a day (TID) | OTIC | 0 refills | Status: DC

## 2019-01-27 NOTE — ED Provider Notes (Signed)
[facility]    CSN: [id number] Arrival date & time: 01/27/19  1015      History   Chief Complaint Chief Complaint  Patient presents with  . Dizziness  . Otalgia    left    HPI Timothy Fleming is a 34 y.o. male.   Subjective:  Timothy Fleming is a 34 y.o. male who is here for evaluation of dizziness. The dizziness has been present for several weeks. He was first evaluated for this here on 01/18/19. He was given a prescription for meclizine and Flonase. He didn't have much improvement in his symptoms and returned for re-evaluation on 12/28. He was diagnosed with left OM with effusion. He was started on a course of doxycycline and told to continue the other therapies from 01/18/19 visit. Patient reports some improvement in his ear pain but the dizziness is unchanged in spite of medications. His symptoms are exacerbated by rapid head movements, rolling over in bed, rising from supine position and bending. He denies fevers, congestion, tinnitus or hearing loss.   The following portions of the patient's history were reviewed and updated as appropriate: allergies, current medications, past family history, past medical history, past social history, past surgical history and problem list.       Past Medical History:  Diagnosis Date  . Allergy    Seasonal  . GERD (gastroesophageal reflux disease)   . History of kidney stones    x4  . Insomnia     Patient Active Problem List   Diagnosis Date Noted  . Ankle instability 12/03/2017  . Sprain of ankle 11/19/2017  . Metabolic syndrome 12/13/2016  . Moderate major depression (HCC) 11/30/2016  . History of vasculitis of skin 05/20/2015  . GERD without esophagitis 11/10/2014  . Seasonal allergic rhinitis 11/10/2014  . History of nephrolithiasis 11/10/2014  . Obesity (BMI 30-39.9) 11/10/2014  . Tobacco abuse 11/10/2014  . Snoring 11/10/2014  . Insomnia 11/10/2014    Past Surgical History:  Procedure Laterality Date  .  FACIAL RECONSTRUCTION SURGERY  01/13/2008   After a car fell on him while working on it       Home Medications    Prior to Admission medications   Medication Sig Start Date End Date Taking? Authorizing Provider  doxycycline (VIBRAMYCIN) 100 MG capsule Take 1 capsule (100 mg total) by mouth 2 (two) times daily. 01/20/19  Yes Lamptey, Britta Mccreedy, MD  escitalopram (LEXAPRO) 20 MG tablet Take 1 tablet (20 mg total) by mouth daily. 12/05/18  Yes Sowles, Danna Hefty, MD  fluticasone (FLONASE) 50 MCG/ACT nasal spray Place 2 sprays into both nostrils daily. 01/18/19  Yes Rodriguez-Southworth, Nettie Elm, PA-C  guaiFENesin (MUCINEX) 600 MG 12 hr tablet Take 1 tablet (600 mg total) by mouth 2 (two) times daily for 10 days. 01/20/19 01/30/19 Yes Lamptey, Britta Mccreedy, MD  omeprazole (PRILOSEC) 20 MG capsule Take 20 mg by mouth daily.   Yes [provider]  QUEtiapine (SEROQUEL) 25 MG tablet Take 1 tablet (25 mg total) by mouth at bedtime. 12/05/18  Yes Sowles, Danna Hefty, MD  neomycin-polymyxin-hydrocortisone (CORTISPORIN) 3.5-10000-1 OTIC suspension Place 3 drops into the left ear 3 (three) times daily for 7 days. 01/27/19 02/03/19  Lurline Idol, FNP    Family History Family History  Problem Relation Age of Onset  . Varicose Veins Mother   . Endometriosis Mother   . Drug abuse Father   . GI Disease Father   . Heart attack Maternal Grandmother   . Breast cancer Maternal  Grandmother   . Thyroid cancer Maternal Grandmother   . Congestive Heart Failure Maternal Grandfather   . Heart attack Maternal Grandfather   . Stroke Maternal Grandfather   . Breast cancer Paternal Grandmother   . Heart attack Paternal Grandfather     Social History Social History   Tobacco Use  . Smoking status: Former Smoker    Packs/day: 0.25    Years: 15.00    Pack years: 3.75    Types: Cigarettes, E-cigarettes    Start date: 11/10/1999    Quit date: 06/05/2017    Years since quitting: 1.6  . Smokeless tobacco: Never  Used  . Tobacco comment: e cig mostly  Substance Use Topics  . Alcohol use: No    Alcohol/week: 0.0 standard drinks  . Drug use: No     Allergies   Bee venom and Penicillins   Review of Systems Review of Systems  Constitutional: Negative.   HENT: Positive for ear pain. Negative for ear discharge, hearing loss and tinnitus.   Gastrointestinal: Positive for nausea.  Musculoskeletal: Negative for neck pain and neck stiffness.  Neurological: Positive for dizziness and headaches.  All other systems reviewed and are negative.    Physical Exam Triage Vital Signs ED Triage Vitals  Enc Vitals Group     BP 01/27/19 1036 127/79     Pulse Rate 01/27/19 1036 79     Resp 01/27/19 1036 18     Temp 01/27/19 1036 97.9 F (36.6 C)     Temp Source 01/27/19 1036 Oral     SpO2 01/27/19 1036 98 %     Weight 01/27/19 1033 264 lb (119.7 kg)     Height 01/27/19 1033 6\' 1"  (1.854 m)     Head Circumference --      Peak Flow --      Pain Score 01/27/19 1033 1     Pain Loc --      Pain Edu? --      Excl. in GC? --    No data found.  Updated Vital Signs BP 127/79 (BP Location: Right Arm)   Pulse 79   Temp 97.9 F (36.6 C) (Oral)   Resp 18   Ht 6\' 1"  (1.854 m)   Wt 264 lb (119.7 kg)   SpO2 98%   BMI 34.83 kg/m   Visual Acuity Right Eye Distance:   Left Eye Distance:   Bilateral Distance:    Right Eye Near:   Left Eye Near:    Bilateral Near:     Physical Exam Vitals reviewed.  Constitutional:      General: He is not in acute distress.    Appearance: Normal appearance. He is not ill-appearing, toxic-appearing or diaphoretic.  HENT:     Head: Normocephalic.     Right Ear: Tympanic membrane, ear canal and external ear normal.     Left Ear: Hearing and external ear normal. Tympanic membrane is erythematous.     Nose: Nose normal.  Eyes:     Extraocular Movements: Extraocular movements intact.     Right eye: No nystagmus.     Left eye: No nystagmus.      Conjunctiva/sclera: Conjunctivae normal.     Pupils: Pupils are equal, round, and reactive to light.  Cardiovascular:     Rate and Rhythm: Normal rate and regular rhythm.  Pulmonary:     Effort: Pulmonary effort is normal.  Musculoskeletal:     Cervical back: Normal range of motion.  Lymphadenopathy:  Cervical: No cervical adenopathy.  Skin:    General: Skin is warm and dry.  Neurological:     General: No focal deficit present.     Mental Status: He is alert.     Cranial Nerves: Cranial nerves are intact. No cranial nerve deficit.     Sensory: Sensation is intact.     Motor: Motor function is intact.     Coordination: Romberg sign positive.     Gait: Gait is intact.     Comments: Positive dix-hallpike   Psychiatric:        Mood and Affect: Mood normal.        Behavior: Behavior normal.        Thought Content: Thought content normal.        Judgment: Judgment normal.      UC Treatments / Results  Labs (all labs ordered are listed, but only abnormal results are displayed) Labs Reviewed - No data to display  EKG   Radiology No results found.  Procedures Procedures (including critical care time)  Medications Ordered in UC Medications - No data to display  Initial Impression / Assessment and Plan / UC Course  I have reviewed the triage vital signs and the nursing notes.  Pertinent labs & imaging results that were available during my care of the patient were reviewed by me and considered in my medical decision making (see chart for details).    34 yo male presenting for the third time with complaints of left ear pain and dizziness that has not improved much despite several pharmacological therapies.   Plan:  Finish antibiotics. Start Cortisporin TID x 7 days. Continue meclizine and decongestants per medication orders. No driving or operating machinery until symptoms resolve. Referral to Neurology. Information given to patient to make appointment. Go to ED if  worse. Patient agreeable.   Today's evaluation has revealed no signs of a dangerous process. Discussed diagnosis with patient and/or guardian. Patient and/or guardian aware of their diagnosis, possible red flag symptoms to watch out for and need for close follow up. Patient and/or guardian understands verbal and written discharge instructions. Patient and/or guardian comfortable with plan and disposition.  Patient and/or guardian has a clear mental status at this time, good insight into illness (after discussion and teaching) and has clear judgment to make decisions regarding their care  This care was provided during an unprecedented National Emergency due to the Novel Coronavirus (COVID-19) pandemic. COVID-19 infections and transmission risks place heavy strains on healthcare resources.  As this pandemic evolves, our facility, providers, and staff strive to respond fluidly, to remain operational, and to provide care relative to available resources and information. Outcomes are unpredictable and treatments are without well-defined guidelines. Further, the impact of COVID-19 on all aspects of urgent care, including the impact to patients seeking care for reasons other than COVID-19, is unavoidable during this national emergency. At this time of the global pandemic, management of patients has significantly changed, even for non-COVID positive patients given high local and regional COVID volumes at this time requiring high healthcare system and resource utilization. The standard of care for management of both COVID suspected and non-COVID suspected patients continues to change rapidly at the local, regional, national, and global levels. This patient was worked up and treated to the best available but ever changing evidence and resources available at this current time.   Documentation was completed with the aid of voice recognition software. Transcription may contain typographical errors.   Final Clinical  Impressions(s) / UC Diagnoses   Final diagnoses:  Vertigo  Left otitis media, unspecified otitis media type     Discharge Instructions     Finish antibiotics. Continue Flonase. Take medications as needed for vertigo/dizziness. No driving or operating machinery until symptoms resolve. Follow-up with neurology as soon as possible. Go to ED immediately if symptoms get worse or you have any other concerns.     ED Prescriptions    Medication Sig Dispense Auth. Provider   neomycin-polymyxin-hydrocortisone (CORTISPORIN) 3.5-10000-1 OTIC suspension Place 3 drops into the left ear 3 (three) times daily for 7 days. 3.2 mL Lurline Idol, FNP     PDMP not reviewed this encounter.   Lurline Idol, [place] 01/27/19 1124

## 2019-01-27 NOTE — ED Triage Notes (Signed)
Pt c/o dizziness and left ear pain. He states that he was seen here twice for this and dxed with vertigo given meclizine then with otitis media and given doxycycline. He states that his ear is not better. He also needing extended work note for this.  He is still taking the abx and has 2 days left after today.

## 2019-01-27 NOTE — Discharge Instructions (Addendum)
Finish antibiotics. Continue Flonase. Take medications as needed for vertigo/dizziness. No driving or operating machinery until symptoms resolve. Follow-up with neurology as soon as possible. Go to ED immediately if symptoms get worse or you have any other concerns.

## 2019-01-28 ENCOUNTER — Encounter: Payer: Self-pay | Admitting: Family Medicine

## 2019-01-28 ENCOUNTER — Other Ambulatory Visit: Payer: Self-pay

## 2019-01-28 ENCOUNTER — Telehealth (INDEPENDENT_AMBULATORY_CARE_PROVIDER_SITE_OTHER): Payer: No Typology Code available for payment source | Admitting: Family Medicine

## 2019-01-28 DIAGNOSIS — H6692 Otitis media, unspecified, left ear: Secondary | ICD-10-CM

## 2019-01-28 DIAGNOSIS — R42 Dizziness and giddiness: Secondary | ICD-10-CM

## 2019-01-28 NOTE — Progress Notes (Signed)
Name: Timothy Fleming   MRN: [id number]    DOB: 1986/01/12   Date:01/28/2019       Progress Note  Subjective  Chief Complaint  Chief Complaint  Patient presents with  . Dizziness    for 12 days seen at [facility] x3    I connected with  Timothy Fleming on 01/28/19 at  9:40 AM EST by telephone and verified that I am speaking with the correct person using two identifiers.   I discussed the limitations, risks, security and privacy concerns of performing an evaluation and management service by telephone and the availability of in person appointments. Staff also discussed with the patient that there may be a patient responsible charge related to this service. Patient Location: Home Provider Location: Office Additional Individuals present: None  HPI  Pt presents with concern for ongoing vertigo - started 01/16/2019, went to UC on 01/18/2019 - was given flonase and meclizine.  Then returned on 01/20/2019 and diagnosed with ear infection - given doxycycline and mucinex - he has 2 more days left of the doxy.  He then returned again yesterday for evaluation and was given cortisporin drops as addition to the doxy.  Also given Xanax - only took 2 tablets so far, and has not noticed a big difference.    He is still experiencing left otalgia (described as dull with waxing and waning sharp pains), dizziness (room spinning) - worsens with any movements.  He has tried some home Epley maneuvers without relief.  Denies: vision changes, confusion, nausea, vomiting, weakness.  UC notes from yesterday do endorse +Romberg and + Dix-hallpike.  Discussed ENT referral - he would like to proceed at this time, advised to continue medications as well.  Patient Active Problem List   Diagnosis Date Noted  . Ankle instability 12/03/2017  . Sprain of ankle 11/19/2017  . Metabolic syndrome 12/13/2016  . Moderate major depression (HCC) 11/30/2016  . History of vasculitis of skin 05/20/2015  . GERD without esophagitis  11/10/2014  . Seasonal allergic rhinitis 11/10/2014  . History of nephrolithiasis 11/10/2014  . Obesity (BMI 30-39.9) 11/10/2014  . Tobacco abuse 11/10/2014  . Snoring 11/10/2014  . Insomnia 11/10/2014    Social History   Tobacco Use  . Smoking status: Former Smoker    Packs/day: 0.25    Years: 15.00    Pack years: 3.75    Types: Cigarettes, E-cigarettes    Start date: 11/10/1999    Quit date: 06/05/2017    Years since quitting: 1.6  . Smokeless tobacco: Never Used  . Tobacco comment: e cig mostly  Substance Use Topics  . Alcohol use: No    Alcohol/week: 0.0 standard drinks     Current Outpatient Medications:  .  doxycycline (VIBRAMYCIN) 100 MG capsule, Take 1 capsule (100 mg total) by mouth 2 (two) times daily., Disp: 20 capsule, Rfl: 0 .  escitalopram (LEXAPRO) 20 MG tablet, Take 1 tablet (20 mg total) by mouth daily., Disp: 90 tablet, Rfl: 1 .  fluticasone (FLONASE) 50 MCG/ACT nasal spray, Place 2 sprays into both nostrils daily., Disp: 18.2 g, Rfl: 0 .  guaiFENesin (MUCINEX) 600 MG 12 hr tablet, Take 1 tablet (600 mg total) by mouth 2 (two) times daily for 10 days., Disp: 20 tablet, Rfl: 0 .  neomycin-polymyxin-hydrocortisone (CORTISPORIN) 3.5-10000-1 OTIC suspension, Place 3 drops into the left ear 3 (three) times daily for 7 days., Disp: 3.2 mL, Rfl: 0 .  omeprazole (PRILOSEC) 20 MG capsule, Take 20 mg by mouth  daily., Disp: , Rfl:  .  QUEtiapine (SEROQUEL) 25 MG tablet, TAKE 1 TABLET(25 MG) BY MOUTH AT BEDTIME, Disp: 90 tablet, Rfl: 1 .  ALPRAZolam (XANAX) 0.5 MG tablet, Take 0.5 mg by mouth 3 (three) times daily as needed., Disp: , Rfl:   Allergies  Allergen Reactions  . Bee Venom Anaphylaxis, Shortness Of Breath and Swelling  . Penicillins Hives    vasculitis    I personally reviewed active problem list, medication list, allergies, notes from last encounter, lab results with the patient/caregiver today.  ROS  Ten systems reviewed and is negative except as  mentioned in HPI  Objective  Virtual encounter, vitals not obtained.  There is no height or weight on file to calculate BMI.  Nursing Note and Vital Signs reviewed.  Physical Exam  Pulmonary/Chest: Effort normal. No respiratory distress. Speaking in complete sentences Neurological: Pt is alert and oriented to person, place, and time. Speech is normal. Psychiatric: Patient has a normal mood and affect. behavior is normal. Judgment and thought content normal.  No results found for this or any previous visit (from the past 72 hour(s)).  Assessment & Plan  1. Vertigo - Ambulatory referral to ENT - discussed safety in detail; work note to be provided for today and tomorrow.   2. Left otitis media, unspecified otitis media type - Ambulatory referral to ENT - Continue drops and PO Doxy  -Red flags and when to present for emergency care or RTC including fever >101.54F, chest pain, shortness of breath, new/worsening/un-resolving symptoms, reviewed with patient at time of visit. Follow up and care instructions discussed and provided in AVS. - I discussed the assessment and treatment plan with the patient. The patient was provided an opportunity to ask questions and all were answered. The patient agreed with the plan and demonstrated an understanding of the instructions.  - The patient was advised to call back or seek an in-person evaluation if the symptoms worsen or if the condition fails to improve as anticipated.  I provided 12 minutes of non-face-to-face time during this encounter.  Hubbard Hartshorn, FNP

## 2019-01-30 ENCOUNTER — Other Ambulatory Visit: Payer: Self-pay

## 2019-01-30 ENCOUNTER — Ambulatory Visit (INDEPENDENT_AMBULATORY_CARE_PROVIDER_SITE_OTHER): Payer: No Typology Code available for payment source | Admitting: Family Medicine

## 2019-01-30 ENCOUNTER — Encounter: Payer: Self-pay | Admitting: Family Medicine

## 2019-01-30 VITALS — BP 120/78 | HR 83 | Temp 97.5°F | Resp 18 | Ht 74.0 in | Wt 268.2 lb

## 2019-01-30 DIAGNOSIS — R42 Dizziness and giddiness: Secondary | ICD-10-CM

## 2019-01-30 DIAGNOSIS — H6692 Otitis media, unspecified, left ear: Secondary | ICD-10-CM | POA: Diagnosis not present

## 2019-01-30 MED ORDER — MOXIFLOXACIN HCL 400 MG PO TABS: 400.0000 mg | ORAL_TABLET | Freq: Every day | ORAL | 0 refills | Status: DC

## 2019-01-30 NOTE — Progress Notes (Signed)
Name: Timothy Fleming   MRN: [id number]    DOB: 1985/12/20   Date:01/30/2019       Progress Note  Subjective  Chief Complaint  Chief Complaint  Patient presents with  . Follow-up  . Dizziness    HPI  Pt presents with concern for ongoing vertigo - started 01/16/2019, went to UC on 01/18/2019 - was given flonase and meclizine.  Then returned on 01/20/2019 and diagnosed with ear infection - given doxycycline and mucinex - he has 1 more day left of the doxy.  He then returned again 2 days ago for evaluation and was given cortisporin drops as addition to the doxy.  Also given Xanax - only took 2 tablets so far, and has not noticed a difference.  He was seen virtually yesterday and advised to maintain current regimen with referral to ENT, however he notes worsening of vertigo and ear pain (3/10) today.  He is still experiencing left otalgia (described as dull with waxing and waning sharp pains), dizziness (room spinning) - worsens with any movements.  He has tried some home Epley maneuvers without relief.  Denies: vision changes, confusion, nausea, vomiting, weakness.  UC notes from 2 days ago do endorse +Romberg and + Dix-hallpike. ENT referral was placed yesterday, however he has not had appt scheduled yet.   Patient Active Problem List   Diagnosis Date Noted  . Ankle instability 12/03/2017  . Sprain of ankle 11/19/2017  . Metabolic syndrome 12/13/2016  . Moderate major depression (HCC) 11/30/2016  . History of vasculitis of skin 05/20/2015  . GERD without esophagitis 11/10/2014  . Seasonal allergic rhinitis 11/10/2014  . History of nephrolithiasis 11/10/2014  . Obesity (BMI 30-39.9) 11/10/2014  . Tobacco abuse 11/10/2014  . Snoring 11/10/2014  . Insomnia 11/10/2014    Social History   Tobacco Use  . Smoking status: Former Smoker    Packs/day: 0.25    Years: 15.00    Pack years: 3.75    Types: Cigarettes, E-cigarettes    Start date: 11/10/1999    Quit date: 06/05/2017    Years  since quitting: 1.6  . Smokeless tobacco: Never Used  . Tobacco comment: e cig mostly  Substance Use Topics  . Alcohol use: No    Alcohol/week: 0.0 standard drinks     Current Outpatient Medications:  .  ALPRAZolam (XANAX) 0.5 MG tablet, Take 0.5 mg by mouth 3 (three) times daily as needed., Disp: , Rfl:  .  doxycycline (VIBRAMYCIN) 100 MG capsule, Take 1 capsule (100 mg total) by mouth 2 (two) times daily., Disp: 20 capsule, Rfl: 0 .  escitalopram (LEXAPRO) 20 MG tablet, Take 1 tablet (20 mg total) by mouth daily., Disp: 90 tablet, Rfl: 1 .  fluticasone (FLONASE) 50 MCG/ACT nasal spray, Place 2 sprays into both nostrils daily., Disp: 18.2 g, Rfl: 0 .  guaiFENesin (MUCINEX) 600 MG 12 hr tablet, Take 1 tablet (600 mg total) by mouth 2 (two) times daily for 10 days., Disp: 20 tablet, Rfl: 0 .  neomycin-polymyxin-hydrocortisone (CORTISPORIN) 3.5-10000-1 OTIC suspension, Place 3 drops into the left ear 3 (three) times daily for 7 days., Disp: 3.2 mL, Rfl: 0 .  omeprazole (PRILOSEC) 20 MG capsule, Take 20 mg by mouth daily., Disp: , Rfl:  .  QUEtiapine (SEROQUEL) 25 MG tablet, TAKE 1 TABLET(25 MG) BY MOUTH AT BEDTIME, Disp: 90 tablet, Rfl: 1  Allergies  Allergen Reactions  . Bee Venom Anaphylaxis, Shortness Of Breath and Swelling  . Penicillins Hives  vasculitis    I personally reviewed active problem list, medication list, allergies, notes from last encounter, lab results with the patient/caregiver today.  ROS  Ten systems reviewed and is negative except as mentioned in HPI  Objective  Vitals:   01/30/19 1136  BP: 120/78  Pulse: 83  Resp: 18  Temp: (!) 97.5 F (36.4 C)  TempSrc: Temporal  SpO2: 99%  Weight: 268 lb 3.2 oz (121.7 kg)  Height: 6\' 2"  (1.88 m)   Body mass index is 34.43 kg/m.  Nursing Note and Vital Signs reviewed.  Physical Exam  Constitutional: Patient appears well-developed and well-nourished. No distress.  HENT: Head: Normocephalic and atraumatic.  Ears: RIGHT TM is WNL, LEFT TM is dull, retracted, and erythematous; Nose: Nose normal. Mouth/Throat: Oropharynx is clear and moist. No oropharyngeal exudate or tonsillar swelling.  Eyes: Conjunctivae and EOM are normal. No scleral icterus.  Pupils are equal, round, and reactive to light.  Neck: Normal range of motion. Neck supple. No JVD present. No thyromegaly present.  Cardiovascular: Normal rate, regular rhythm and normal heart sounds.  No murmur heard. No BLE edema. Pulmonary/Chest: Effort normal and breath sounds normal. No respiratory distress. Abdominal: Soft. Bowel sounds are normal, no distension. There is no tenderness. No masses. Musculoskeletal: Normal range of motion, no joint effusions. No gross deformities Neurological: Pt is alert and oriented to person, place, and time. No cranial nerve deficit. Gait is unsteady with sudden movements only.  +Dix-Hallpike. Skin: Skin is warm and dry. No rash noted. No erythema.  Psychiatric: Patient has a normal mood and affect. behavior is normal. Judgment and thought content normal.  No results found for this or any previous visit (from the past 72 hour(s)).  Assessment & Plan 1. Left otitis media, unspecified otitis media type - Stop Doxy and the Cortisporin, start Avelox.  Needs ENT evaluation ASAP. - moxifloxacin (AVELOX) 400 MG tablet; Take 1 tablet (400 mg total) by mouth daily.  Dispense: 7 tablet; Refill: 0  2. Vertigo - Changed ENT referral to urgent.  Out of work until ENT able to see.  -Red flags and when to present for emergency care or RTC including fever >101.55F, chest pain, shortness of breath, new/worsening/un-resolving symptoms, changes neurologic status, reviewed signs/symptoms of stroke reviewed with patient at time of visit. Follow up and care instructions discussed and provided in AVS.

## 2019-01-31 ENCOUNTER — Encounter: Payer: Self-pay | Admitting: Family Medicine

## 2019-02-03 ENCOUNTER — Telehealth: Payer: No Typology Code available for payment source | Admitting: Family Medicine

## 2019-02-03 ENCOUNTER — Encounter: Payer: Self-pay | Admitting: Family Medicine

## 2019-02-03 DIAGNOSIS — H8111 Benign paroxysmal vertigo, right ear: Secondary | ICD-10-CM | POA: Diagnosis not present

## 2019-02-04 ENCOUNTER — Encounter: Payer: Self-pay | Admitting: Family Medicine

## 2019-02-04 NOTE — Telephone Encounter (Signed)
Copied from CRM [id number]. Topic: General - Inquiry >> Feb 04, 2019 12:12 PM Timothy Fleming wrote: Reason for CRM:   Pt calling to check on MyChart messages - pt states that he needs work note ASAP.

## 2019-02-10 ENCOUNTER — Other Ambulatory Visit: Payer: Self-pay | Admitting: Family Medicine

## 2019-02-10 DIAGNOSIS — M25562 Pain in left knee: Secondary | ICD-10-CM

## 2019-02-21 ENCOUNTER — Other Ambulatory Visit: Payer: Self-pay | Admitting: Family Medicine

## 2019-02-21 DIAGNOSIS — F325 Major depressive disorder, single episode, in full remission: Secondary | ICD-10-CM

## 2019-04-01 ENCOUNTER — Ambulatory Visit
Admission: EM | Admit: 2019-04-01 | Discharge: 2019-04-01 | Disposition: A | Discharge status: Home / Self Care | Payer: 59 | Attending: Family Medicine | Admitting: Family Medicine

## 2019-04-01 ENCOUNTER — Other Ambulatory Visit: Payer: Self-pay

## 2019-04-01 ENCOUNTER — Encounter: Payer: Self-pay | Admitting: Emergency Medicine

## 2019-04-01 DIAGNOSIS — B349 Viral infection, unspecified: Secondary | ICD-10-CM | POA: Insufficient documentation

## 2019-04-01 DIAGNOSIS — Z20822 Contact with and (suspected) exposure to covid-19: Secondary | ICD-10-CM

## 2019-04-01 DIAGNOSIS — M791 Myalgia, unspecified site: Secondary | ICD-10-CM

## 2019-04-01 DIAGNOSIS — Z7189 Other specified counseling: Secondary | ICD-10-CM

## 2019-04-01 DIAGNOSIS — R5383 Other fatigue: Secondary | ICD-10-CM

## 2019-04-01 LAB — SARS CORONAVIRUS 2 (TAT 6-24 HRS): SARS Coronavirus 2: NEGATIVE

## 2019-04-01 NOTE — Discharge Instructions (Addendum)
It was very nice seeing you today in clinic. Thank you for entrusting me with your care.   Rest and stay HYDRATED. Water and electrolyte containing beverages (Gatorade, Pedialyte) are best to prevent dehydration and electrolyte abnormalities.   You were tested for SARS-CoV-2 (novel coronavirus) today. Testing is being processed at the main campus of [facility] in [place], and have been taking 12-24 hours to come back. Current recommendations from the the CDC and  DHHS require that you remain out of work in order to quarantine at home until negative test results are have been received. In the event that your test results are positive, you will be contacted with further directives. These measures are being implemented out of an abundance of caution to prevent transmission and spread during the current SARS-CoV-2 pandemic.  Make arrangements to follow up with your regular doctor in 1 week for re-evaluation if not improving. If your symptoms/condition worsens, please seek follow up care either here or in the ER. Please remember, our [facility] providers are "right here with you" when you need [place].   Again, it was my pleasure to take care of you today. Thank you for choosing our clinic. I hope that you start to feel better quickly.   Quentin Mulling, MSN, APRN, FNP-C, CEN Advanced Practice Provider [facility]

## 2019-04-01 NOTE — ED Triage Notes (Signed)
Patient c/o fatigue and generalized body aches that started yesterday morning. Denies any other symptoms.

## 2019-04-01 NOTE — ED Provider Notes (Signed)
[place], [place]   Name: Timothy Fleming DOB: [birth year] MRN: [id number] CSN: [id number] PCP: Alba Cory, MD  Arrival date and time:  04/01/19 0853  Chief Complaint:  Fatigue and Generalized Body Aches   NOTE: Prior to seeing the patient today, I have reviewed the triage nursing documentation and vital signs. Clinical staff has updated patient's PMH/PSHx, current medication list, and drug allergies/intolerances to ensure comprehensive history available to assist in medical decision making.   History:   HPI: Timothy Fleming is a 34 y.o. male who presents today with complaints of fatigue and generalized body aches that started yesterday morning. Patient denies fevers. He denies any cough, shortness of breath, or wheezing. He denies that he has experienced any nausea, vomiting, diarrhea, or abdominal pain. Patient has not had any congestion, sore throat, otalgia, or headaches. He is eating and drinking well. Patient denies any perceived alterations to his sense of taste or smell. Patient denies being in close contact with anyone known to be ill; no one else is his home has experienced a similar symptom constellation. He has not been tested for SARS-CoV-2 (novel coronavirus) in the past 14 days; last tested negative on 01/02/2019 per his report. Patient has no other complaints today. Despite his symptoms, patient has not taken any over the counter interventions to help improve/relieve his reported symptoms at home.   Past Medical History:  Diagnosis Date  . Allergy    Seasonal  . GERD (gastroesophageal reflux disease)   . History of kidney stones    x4  . Insomnia     Past Surgical History:  Procedure Laterality Date  . FACIAL RECONSTRUCTION SURGERY  01/13/2008   After a car fell on him while working on it    Family History  Problem Relation Age of Onset  . Varicose Veins Mother   . Endometriosis Mother   . Drug abuse Father   . GI Disease Father   . Heart attack Maternal  Grandmother   . Breast cancer Maternal Grandmother   . Thyroid cancer Maternal Grandmother   . Congestive Heart Failure Maternal Grandfather   . Heart attack Maternal Grandfather   . Stroke Maternal Grandfather   . Breast cancer Paternal Grandmother   . Heart attack Paternal Grandfather     Social History   Tobacco Use  . Smoking status: Former Smoker    Packs/day: 0.25    Years: 15.00    Pack years: 3.75    Types: Cigarettes, E-cigarettes    Start date: 11/10/1999    Quit date: 06/05/2017    Years since quitting: 1.8  . Smokeless tobacco: Never Used  . Tobacco comment: e cig mostly  Substance Use Topics  . Alcohol use: No    Alcohol/week: 0.0 standard drinks  . Drug use: No    Patient Active Problem List   Diagnosis Date Noted  . Ankle instability 12/03/2017  . Sprain of ankle 11/19/2017  . Metabolic syndrome 12/13/2016  . Moderate major depression (HCC) 11/30/2016  . History of vasculitis of skin 05/20/2015  . GERD without esophagitis 11/10/2014  . Seasonal allergic rhinitis 11/10/2014  . History of nephrolithiasis 11/10/2014  . Obesity (BMI 30-39.9) 11/10/2014  . Tobacco abuse 11/10/2014  . Snoring 11/10/2014  . Insomnia 11/10/2014    Home Medications:    Current Meds  Medication Sig  . escitalopram (LEXAPRO) 20 MG tablet TAKE 1 TABLET(20 MG) BY MOUTH DAILY  . omeprazole (PRILOSEC) 20 MG capsule Take 20 mg by mouth daily.  Marland Kitchen  QUEtiapine (SEROQUEL) 25 MG tablet TAKE 1 TABLET(25 MG) BY MOUTH AT BEDTIME    Allergies:   Bee venom and Penicillins  Review of Systems (ROS):  Review of systems NEGATIVE unless otherwise noted in narrative H&P section.   Vital Signs: Today's Vitals   04/01/19 0912 04/01/19 0915 04/01/19 0934  BP:  110/76   Pulse:  86   Resp:  18   Temp:  98.4 F (36.9 C)   TempSrc:  Oral   SpO2:  98%   Weight: 265 lb (120.2 kg)    Height: 6\' 2"  (1.88 m)    PainSc: 2   2     Physical Exam: Physical Exam  Constitutional: He is  oriented to person, place, and time and well-developed, well-nourished, and in no distress.  HENT:  Head: Normocephalic and atraumatic.  Right Ear: Tympanic membrane normal.  Left Ear: Tympanic membrane normal.  Nose: Nose normal.  Mouth/Throat: Uvula is midline, oropharynx is clear and moist and mucous membranes are normal.  Eyes: Pupils are equal, round, and reactive to light.  Cardiovascular: Normal rate, regular rhythm, normal heart sounds and intact distal pulses.  Pulmonary/Chest: Effort normal and breath sounds normal.  Neurological: He is alert and oriented to person, place, and time. Gait normal.  Skin: Skin is warm and dry. No rash noted. He is not diaphoretic.  Psychiatric: Mood, memory, affect and judgment normal.  Nursing note and vitals reviewed.   Urgent Care Treatments / Results:   Orders Placed This Encounter  Procedures  . SARS CORONAVIRUS 2 (TAT 6-24 HRS) Nasopharyngeal Nasopharyngeal Swab    LABS: PLEASE NOTE: all labs that were ordered this encounter are listed, however only abnormal results are displayed. Labs Reviewed  SARS CORONAVIRUS 2 (TAT 6-24 HRS)    EKG: -None  RADIOLOGY: No results found.  PROCEDURES: Procedures  MEDICATIONS RECEIVED THIS VISIT: Medications - No data to display  PERTINENT CLINICAL COURSE NOTES/UPDATES:   Initial Impression / Assessment and Plan / Urgent Care Course:  Pertinent labs & imaging results that were available during my care of the patient were personally reviewed by me and considered in my medical decision making (see lab/imaging section of note for values and interpretations).  Timothy Fleming is a 34 y.o. male who presents to [facility] today with complaints of Fatigue and Generalized Body Aches  Patient overall well appearing and in no acute distress today in clinic. Presenting symptoms (see HPI) and exam as documented above. He presents with symptoms associated with SARS-CoV-2 (novel coronavirus).  Discussed typical symptom constellation. Reviewed potential for infection and need for testing. Patient amenable to being tested. SARS-CoV-2 swab collected by certified clinical staff. Discussed variable turn around times associated with testing, as swabs are being processed at the main campus of [facility] in [place], and have been taking 12-24 hours to come back. He was advised to self quarantine, per [facility] DHHS guidelines, until negative results received. These measures are being implemented out of an abundance of caution to prevent transmission and spread during the current SARS-CoV-2 pandemic.  Presenting symptoms consistent with acute viral illness. Until ruled out with confirmatory lab testing, SARS-CoV-2 remains part of the differential. His testing is pending at this time. I discussed with him that his symptoms are felt to be viral in nature, thus antibiotics would not offer him any relief or improve his symptoms any faster than conservative symptomatic management. Discussed supportive care measures at home during acute phase of illness. Patient to rest as  much as possible. He was encouraged to ensure adequate hydration (water and ORS) to prevent dehydration and electrolyte derangements. Patient may use APAP and/or IBU on an as needed basis for pain/fever.    Current clinical condition warrants patient being out of work in order to quarantine while waiting for testing results. He was provided with the appropriate documentation to provide to his place of employment that will allow for him to RTW on 04/03/2019 with no restrictions. RTW is contingent on his SARS-CoV-2 test results being reviewed as negative.     Discussed follow up with primary care physician in 1 week for re-evaluation. I have reviewed the follow up and strict return precautions for any new or worsening symptoms. Patient is aware of symptoms that would be deemed urgent/emergent, and would thus require further evaluation either here or  in the emergency department. At the time of discharge, he verbalized understanding and consent with the discharge plan as it was reviewed with him. All questions were fielded by provider and/or clinic staff prior to patient discharge.    Final Clinical Impressions / Urgent Care Diagnoses:   Final diagnoses:  Viral illness  Encounter for laboratory testing for COVID-19 virus  Advice given about COVID-19 virus infection    New Prescriptions:  [place] Controlled Substance Registry consulted? Not Applicable  No orders of the defined types were placed in this encounter.   Recommended Follow up Care:  Patient encouraged to follow up with the following provider within the specified time frame, or sooner as dictated by the severity of his symptoms. As always, he was instructed that for any urgent/emergent care needs, he should seek care either here or in the emergency department for more immediate evaluation.  Follow-up Information    Alba Cory, MD In 1 week.   Specialty: Family Medicine Why: General reassessment of symptoms if not improving Contact information: [address] [place] [address] [phone]         NOTE: This note was prepared using Dragon dictation software along with smaller phrase technology. Despite my best ability to proofread, there is the potential that transcriptional errors may still occur from this process, and are completely unintentional.    Verlee Monte, NP 04/01/19 [phone]

## 2019-04-07 ENCOUNTER — Ambulatory Visit
Admission: EM | Admit: 2019-04-07 | Discharge: 2019-04-07 | Disposition: A | Discharge status: Home / Self Care | Payer: 59 | Attending: Family Medicine | Admitting: Family Medicine

## 2019-04-07 ENCOUNTER — Encounter: Payer: Self-pay | Admitting: Emergency Medicine

## 2019-04-07 ENCOUNTER — Other Ambulatory Visit: Payer: Self-pay

## 2019-04-07 DIAGNOSIS — K529 Noninfective gastroenteritis and colitis, unspecified: Secondary | ICD-10-CM | POA: Diagnosis not present

## 2019-04-07 MED ORDER — ONDANSETRON HCL 4 MG PO TABS: 4.0000 mg | ORAL_TABLET | Freq: Three times a day (TID) | ORAL | 0 refills | Status: DC | PRN

## 2019-04-07 MED ORDER — DIPHENOXYLATE-ATROPINE 2.5-0.025 MG PO TABS: 1.0000 | ORAL_TABLET | Freq: Four times a day (QID) | ORAL | 0 refills | Status: DC | PRN

## 2019-04-07 NOTE — ED Triage Notes (Signed)
Patient c/o vomiting and diarrhea that started this morning around 1am. He states he has taken 2 doses of Pepto and has continued to have diarrhea.

## 2019-04-07 NOTE — ED Provider Notes (Signed)
[facility]    CSN: [id number] Arrival date & time: 04/07/19  1302  History   Chief Complaint Chief Complaint  Patient presents with  . Diarrhea  . Emesis   HPI  34 year old male presents with nausea, vomiting, diarrhea.  Started abruptly early this morning around 1 AM.  He has taken 2 doses of Pepto-Bismol without resolution.  Vomiting has subsided.  However, he continues to have diarrhea.  Feels poorly.  No documented fever.  No known dietary source.  Rates his pain as 2/10 in severity.  No relieving factors.  No sick contacts.  No other associated symptoms.  No other complaints.  Past Medical History:  Diagnosis Date  . Allergy    Seasonal  . GERD (gastroesophageal reflux disease)   . History of kidney stones    x4  . Insomnia     Patient Active Problem List   Diagnosis Date Noted  . Ankle instability 12/03/2017  . Sprain of ankle 11/19/2017  . Metabolic syndrome 12/13/2016  . Moderate major depression (HCC) 11/30/2016  . History of vasculitis of skin 05/20/2015  . GERD without esophagitis 11/10/2014  . Seasonal allergic rhinitis 11/10/2014  . History of nephrolithiasis 11/10/2014  . Obesity (BMI 30-39.9) 11/10/2014  . Tobacco abuse 11/10/2014  . Snoring 11/10/2014  . Insomnia 11/10/2014    Past Surgical History:  Procedure Laterality Date  . FACIAL RECONSTRUCTION SURGERY  01/13/2008   After a car fell on him while working on it       Home Medications    Prior to Admission medications   Medication Sig Start Date End Date Taking? Authorizing Provider  escitalopram (LEXAPRO) 20 MG tablet TAKE 1 TABLET(20 MG) BY MOUTH DAILY 02/21/19  Yes Sowles, Danna Hefty, MD  omeprazole (PRILOSEC) 20 MG capsule Take 20 mg by mouth daily.   Yes [provider]  QUEtiapine (SEROQUEL) 25 MG tablet TAKE 1 TABLET(25 MG) BY MOUTH AT BEDTIME 01/27/19  Yes Sowles, Danna Hefty, MD  diphenoxylate-atropine (LOMOTIL) 2.5-0.025 MG tablet Take 1 tablet by mouth 4 (four)  times daily as needed for diarrhea or loose stools. 04/07/19   Tommie Sams, DO  ondansetron (ZOFRAN) 4 MG tablet Take 1 tablet (4 mg total) by mouth every 8 (eight) hours as needed for nausea or vomiting. 04/07/19   Tommie Sams, DO  fluticasone (FLONASE) 50 MCG/ACT nasal spray Place 2 sprays into both nostrils daily. 01/18/19 04/01/19  Rodriguez-Southworth, Nettie Elm, PA-C    Family History Family History  Problem Relation Age of Onset  . Varicose Veins Mother   . Endometriosis Mother   . Drug abuse Father   . GI Disease Father   . Heart attack Maternal Grandmother   . Breast cancer Maternal Grandmother   . Thyroid cancer Maternal Grandmother   . Congestive Heart Failure Maternal Grandfather   . Heart attack Maternal Grandfather   . Stroke Maternal Grandfather   . Breast cancer Paternal Grandmother   . Heart attack Paternal Grandfather     Social History Social History   Tobacco Use  . Smoking status: Former Smoker    Packs/day: 0.25    Years: 15.00    Pack years: 3.75    Types: Cigarettes, E-cigarettes    Start date: 11/10/1999    Quit date: 06/05/2017    Years since quitting: 1.8  . Smokeless tobacco: Never Used  . Tobacco comment: e cig mostly  Substance Use Topics  . Alcohol use: No    Alcohol/week: 0.0 standard drinks  .  Drug use: No     Allergies   Bee venom and Penicillins   Review of Systems Review of Systems  Constitutional: Positive for appetite change.  Gastrointestinal: Positive for diarrhea, nausea and vomiting.   Physical Exam Triage Vital Signs ED Triage Vitals  Enc Vitals Group     BP 04/07/19 1316 128/80     Pulse Rate 04/07/19 1316 72     Resp 04/07/19 1316 18     Temp 04/07/19 1316 98.2 F (36.8 C)     Temp Source 04/07/19 1316 Oral     SpO2 04/07/19 1316 98 %     Weight 04/07/19 1315 265 lb (120.2 kg)     Height 04/07/19 1315 6\' 2"  (1.88 m)     Head Circumference --      Peak Flow --      Pain Score 04/07/19 1314 2     Pain Loc --       Pain Edu? --      Excl. in GC? --    Updated Vital Signs BP 128/80 (BP Location: Right Arm)   Pulse 72   Temp 98.2 F (36.8 C) (Oral)   Resp 18   Ht 6\' 2"  (1.88 m)   Wt 120.2 kg   SpO2 98%   BMI 34.02 kg/m   Visual Acuity Right Eye Distance:   Left Eye Distance:   Bilateral Distance:    Right Eye Near:   Left Eye Near:    Bilateral Near:     Physical Exam Vitals and nursing note reviewed.  Constitutional:      General: He is not in acute distress.    Appearance: Normal appearance. He is not ill-appearing.  HENT:     Head: Normocephalic and atraumatic.  Eyes:     General:        Right eye: No discharge.        Left eye: No discharge.     Conjunctiva/sclera: Conjunctivae normal.  Cardiovascular:     Rate and Rhythm: Normal rate and regular rhythm.     Heart sounds: No murmur.  Pulmonary:     Effort: Pulmonary effort is normal.     Breath sounds: Normal breath sounds. No wheezing, rhonchi or rales.  Abdominal:     General: There is no distension.     Palpations: Abdomen is soft.     Comments: Mild diffuse tenderness.   Neurological:     Mental Status: He is alert.  Psychiatric:        Mood and Affect: Mood normal.        Behavior: Behavior normal.    UC Treatments / Results  Labs (all labs ordered are listed, but only abnormal results are displayed) Labs Reviewed - No data to display  EKG   Radiology No results found.  Procedures Procedures (including critical care time)  Medications Ordered in UC Medications - No data to display  Initial Impression / Assessment and Plan / UC Course  I have reviewed the triage vital signs and the nursing notes.  Pertinent labs & imaging results that were available during my care of the patient were reviewed by me and considered in my medical decision making (see chart for details).    34 year old male presents with gastroenteritis.  Zofran as directed.  Lomotil as directed.  Push fluids.  Supportive care.   Work note given.  Final Clinical Impressions(s) / UC Diagnoses   Final diagnoses:  Gastroenteritis   Discharge Instructions   None  ED Prescriptions    Medication Sig Dispense Auth. Provider   ondansetron (ZOFRAN) 4 MG tablet Take 1 tablet (4 mg total) by mouth every 8 (eight) hours as needed for nausea or vomiting. 20 tablet Broadus Costilla G, DO   diphenoxylate-atropine (LOMOTIL) 2.5-0.025 MG tablet Take 1 tablet by mouth 4 (four) times daily as needed for diarrhea or loose stools. 30 tablet Coral Spikes, DO     PDMP not reviewed this encounter.   Coral Spikes, [place] 04/07/19 1719

## 2019-05-22 ENCOUNTER — Other Ambulatory Visit: Payer: Self-pay | Admitting: Family Medicine

## 2019-05-22 DIAGNOSIS — F325 Major depressive disorder, single episode, in full remission: Secondary | ICD-10-CM

## 2019-06-06 ENCOUNTER — Other Ambulatory Visit: Payer: Self-pay

## 2019-06-06 ENCOUNTER — Ambulatory Visit: Payer: 59 | Admitting: Family Medicine

## 2019-06-06 ENCOUNTER — Encounter: Payer: Self-pay | Admitting: Family Medicine

## 2019-06-06 VITALS — BP 120/70 | HR 78 | Temp 97.7°F | Resp 16 | Ht 74.0 in | Wt 259.2 lb

## 2019-06-06 DIAGNOSIS — K219 Gastro-esophageal reflux disease without esophagitis: Secondary | ICD-10-CM

## 2019-06-06 DIAGNOSIS — R69 Illness, unspecified: Secondary | ICD-10-CM | POA: Diagnosis not present

## 2019-06-06 DIAGNOSIS — M546 Pain in thoracic spine: Secondary | ICD-10-CM

## 2019-06-06 DIAGNOSIS — G4709 Other insomnia: Secondary | ICD-10-CM | POA: Diagnosis not present

## 2019-06-06 DIAGNOSIS — E8881 Metabolic syndrome: Secondary | ICD-10-CM

## 2019-06-06 DIAGNOSIS — F325 Major depressive disorder, single episode, in full remission: Secondary | ICD-10-CM

## 2019-06-06 DIAGNOSIS — E785 Hyperlipidemia, unspecified: Secondary | ICD-10-CM

## 2019-06-06 MED ORDER — BACLOFEN 10 MG PO TABS: 10.0000 mg | ORAL_TABLET | Freq: Three times a day (TID) | ORAL | 0 refills | Status: DC | PRN

## 2019-06-06 MED ORDER — MELOXICAM 15 MG PO TABS: 15.0000 mg | ORAL_TABLET | Freq: Every day | ORAL | 0 refills | Status: DC

## 2019-06-06 MED ORDER — QUETIAPINE FUMARATE 25 MG PO TABS: 25.0000 mg | ORAL_TABLET | Freq: Every day | ORAL | 1 refills | Status: DC

## 2019-06-06 NOTE — Progress Notes (Signed)
Name: Timothy Fleming   MRN: [id number]    DOB: [birth year]   Date:06/06/2019       Progress Note  Subjective  Chief Complaint  Chief Complaint  Patient presents with  . Depression  . Obesity  . Insomnia  . 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   Obesity:[place] 4 lbs since Nov 2020. 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 He gets otc medication - PPI. Discussed long term use of PPI. Doing well unless he skips for a couple days   Insomnia; taking Seroquelmost nights and is able to fall and stay asleep without any side effects.  Doing well   History of back injury: it happened at work a couple of months ago, he had PT and symptoms resolved since Fall of 2020, however he another fall at work in 2021 and hit lower back but symptoms also resolved. He is currently neck to mid back pain, described as throbbing, constant, worse when he tries to stretch his back. Pains is 2/10. He denies a specific area of pain, feels stiff. He has been taking ibuprofen intermittently  Pre-diabetes:reviewed labs with him today.Denies polyphagia, polyuria or polydipsia.He lost weight since last visit   Patient Active Problem List   Diagnosis Date Noted  . Ankle instability 12/03/2017  . Sprain of ankle 11/19/2017  . Metabolic syndrome 41/66/0630  . Moderate major depression ([place]) 11/30/2016  . History of vasculitis of skin 05/20/2015  . GERD without esophagitis 11/10/2014  . Seasonal allergic rhinitis  11/10/2014  . History of nephrolithiasis 11/10/2014  . Obesity (BMI 30-39.9) 11/10/2014  . Tobacco abuse 11/10/2014  . Snoring 11/10/2014  . Insomnia 11/10/2014    Past Surgical History:  Procedure Laterality Date  . FACIAL RECONSTRUCTION SURGERY  01/13/2008   After a car fell on him while working on it    Family History  Problem Relation Age of Onset  . Varicose Veins Mother   . Endometriosis Mother   . Drug abuse Father   . GI Disease Father   . Heart attack Maternal Grandmother   . Breast cancer Maternal Grandmother   . Thyroid cancer Maternal Grandmother   . Congestive Heart Failure Maternal Grandfather   . Heart attack Maternal Grandfather   . Stroke Maternal Grandfather   . Breast cancer Paternal Grandmother   . Heart attack Paternal Grandfather     Social History   Tobacco Use  . Smoking status: Former Smoker    Packs/day: 0.25    Years: 15.00    Pack years: 3.75    Types: Cigarettes, E-cigarettes    Start date: 11/10/1999    Quit date: 06/05/2017    Years since quitting: 2.0  . Smokeless tobacco: Never Used  . Tobacco comment: e cig mostly  Substance Use Topics  . Alcohol use: No    Alcohol/week: 0.0 standard drinks     Current Outpatient Medications:  .  diphenoxylate-atropine (LOMOTIL) 2.5-0.025 MG tablet, Take 1 tablet by mouth 4 (four) times daily as needed  for diarrhea or loose stools., Disp: 30 tablet, Rfl: 0 .  escitalopram (LEXAPRO) 20 MG tablet, TAKE 1 TABLET(20 MG) BY MOUTH DAILY, Disp: 90 tablet, Rfl: 0 .  escitalopram (LEXAPRO) 20 MG tablet, TAKE 1 TABLET(20 MG) BY MOUTH DAILY, Disp: 90 tablet, Rfl: 0 .  omeprazole (PRILOSEC) 20 MG capsule, Take 20 mg by mouth daily., Disp: , Rfl:  .  ondansetron (ZOFRAN) 4 MG tablet, Take 1 tablet (4 mg total) by mouth every 8 (eight) hours as needed for nausea or vomiting., Disp: 20 tablet, Rfl: 0 .  QUEtiapine (SEROQUEL) 25 MG tablet, TAKE 1 TABLET(25 MG) BY MOUTH AT BEDTIME, Disp: 90 tablet, Rfl:  1  Allergies  Allergen Reactions  . Bee Venom Anaphylaxis, Shortness Of Breath and Swelling  . Penicillins Hives    vasculitis    I personally reviewed active problem list, medication list, allergies, family history, social history, health maintenance with the patient/caregiver today.   ROS  Constitutional: Negative for fever, positive for mild  weight change.  Respiratory: Negative for cough and shortness of breath.   Cardiovascular: Negative for chest pain or palpitations.  Gastrointestinal: Negative for abdominal pain, no bowel changes.  Musculoskeletal: Negative for gait problem or joint swelling.  Skin: Negative for rash.  Neurological: Negative for dizziness or headache.  No other specific complaints in a complete review of systems (except as listed in HPI above).  Objective  Vitals:   06/06/19 0836  BP: 120/70  Pulse: 78  Resp: 16  Temp: 97.7 F (36.5 C)  TempSrc: Temporal  SpO2: 98%  Weight: 259 lb 3.2 oz (117.6 kg)  Height: 6\' 2"  (1.88 m)    Body mass index is 33.28 kg/m.  Physical Exam  Constitutional: Patient appears well-developed and well-nourished. Obese  No distress.  HEENT: head atraumatic, normocephalic, pupils equal and reactive to light, neck supple, throat within normal limits Cardiovascular: Normal rate, regular rhythm and normal heart sounds.  No murmur heard. No BLE edema. Pulmonary/Chest: Effort normal and breath sounds normal. No respiratory distress. Abdominal: Soft.  There is no tenderness. Muscular skeletal: pain during palpation of upper back and neck, over spinal processes and also para spinal muscles, pain with flexion and extension, lateral bending, normal gait  Psychiatric: Patient has a normal mood and affect. behavior is normal. Judgment and thought content normal.  Recent Results (from the past 2160 hour(s))  SARS CORONAVIRUS 2 (TAT 6-24 HRS) Nasopharyngeal Nasopharyngeal Swab     Status: None   Collection Time: 04/01/19  9:19  AM   Specimen: Nasopharyngeal Swab  Result Value Ref Range   SARS Coronavirus 2 NEGATIVE NEGATIVE    Comment: (NOTE) SARS-CoV-2 target nucleic acids are NOT DETECTED. The SARS-CoV-2 RNA is generally detectable in upper and lower respiratory specimens during the acute phase of infection. Negative results do not preclude SARS-CoV-2 infection, do not rule out co-infections with other pathogens, and should not be used as the sole basis for treatment or other patient management decisions. Negative results must be combined with clinical observations, patient history, and epidemiological information. The expected result is Negative. Fact Sheet for Patients: 06/01/19 Fact Sheet for Healthcare Providers: HairSlick.no This test is not yet approved or cleared by the quierodirigir.com FDA and  has been authorized for detection and/or diagnosis of SARS-CoV-2 by FDA under an Emergency Use Authorization (EUA). This EUA will remain  in effect (meaning this test can be used) for the duration of the COVID-19 declaration under Section 56 4(b)(1) of the Act, 21  U.S.C. section 360bbb-3(b)(1), unless the authorization is terminated or revoked sooner. Performed at [facility], [address] [place], [place] [address]      PHQ2/9: Depression screen [facility] 2/9 06/06/2019 01/30/2019 01/28/2019 01/02/2019 12/05/2018  Decreased Interest 0 0 0 0 0  Down, Depressed, Hopeless 0 0 0 0 0  PHQ - 2 Score 0 0 0 0 0  Altered sleeping 3 0 0 0 0  Tired, decreased energy 1 0 0 0 0  Change in appetite 0 0 0 0 0  Feeling bad or failure about yourself  0 0 0 0 0  Trouble concentrating 0 0 0 0 0  Moving slowly or fidgety/restless 0 0 0 0 0  Suicidal thoughts 0 0 0 0 0  PHQ-9 Score 4 0 0 0 0  Difficult doing work/chores Not difficult at all Not difficult at all Not difficult at all Not difficult at all -  Some recent data might be hidden    phq 9 is  negative    Fall Risk: Fall Risk  06/06/2019 01/30/2019 01/28/2019 01/02/2019 12/05/2018  Falls in the past year? 0 0 0 0 0  Number falls in past yr: 0 0 0 0 0  Injury with Fall? 0 0 0 0 0  Comment - - - - -  Follow up - Falls evaluation completed Falls evaluation completed - -     Functional Status Survey: Is the patient deaf or have difficulty hearing?: No Does the patient have difficulty seeing, even when wearing glasses/contacts?: No Does the patient have difficulty concentrating, remembering, or making decisions?: No Does the patient have difficulty walking or climbing stairs?: No Does the patient have difficulty dressing or bathing?: No Does the patient have difficulty doing errands alone such as visiting a doctor's office or shopping?: No    Assessment & Plan  1. Dyslipidemia  Discussed life style modification   2. Major depression in remission [facility])  Doing well on lexapro   3. Metabolic syndrome   4. Other insomnia  - QUEtiapine (SEROQUEL) 25 MG tablet; Take 1 tablet (25 mg total) by mouth at bedtime.  Dispense: 90 tablet; Refill: 1  5. GERD without esophagitis  Doing well on medication   6. Acute midline thoracic back pain  Advised chiropractor, home exercises  - meloxicam (MOBIC) 15 MG tablet; Take 1 tablet (15 mg total) by mouth daily.  Dispense: 30 tablet; Refill: 0 - baclofen (LIORESAL) 10 MG tablet; Take 1-2 tablets (10-20 mg total) by mouth 3 (three) times daily as needed for muscle spasms.  Dispense: 60 each; Refill: 0

## 2019-06-06 NOTE — Patient Instructions (Signed)
Thoracic Strain Rehab Ask your health care provider which exercises are safe for you. Do exercises exactly as told by your health care provider and adjust them as directed. It is normal to feel mild stretching, pulling, tightness, or discomfort as you do these exercises. Stop right away if you feel sudden pain or your pain gets worse. Do not begin these exercises until told by your health care provider. Stretching and range-of-motion exercise This exercise warms up your muscles and joints and improves the movement and flexibility of your back and shoulders. This exercise also helps to relieve pain. Chest and spine stretch  1. Lie down on your back on a firm surface. 2. Roll a towel or a small blanket so it is about 4 inches (10 cm) in diameter. 3. Put the towel lengthwise under the middle of your back so it is under your spine, but not under your shoulder blades. 4. Put your hands behind your head and let your elbows fall to your sides. This will increase your stretch. 5. Take a deep breath (inhale). 6. Hold for __________ seconds. 7. Relax after you breathe out (exhale). Repeat __________ times. Complete this exercise __________ times a day. Strengthening exercises These exercises build strength and endurance in your back and your shoulder blade muscles. Endurance is the ability to use your muscles for a long time, even after they get tired. Alternating arm and leg raises  1. Get on your hands and knees on a firm surface. If you are on a hard floor, you may want to use padding, such as an exercise mat, to cushion your knees. 2. Line up your arms and legs. Your hands should be directly below your shoulders, and your knees should be directly below your hips. 3. Lift your left leg behind you. At the same time, raise your right arm and straighten it in front of you. ? Do not lift your leg higher than your hip. ? Do not lift your arm higher than your shoulder. ? Keep your abdominal and back  muscles tight. ? Keep your hips facing the ground. ? Do not arch your back. ? Keep your balance carefully, and do not hold your breath. 4. Hold for __________ seconds. 5. Slowly return to the starting position and repeat with your right leg and your left arm. Repeat __________ times. Complete this exercise __________ times a day. Straight arm rows This exercise is also called shoulder extension exercise. 1. Stand with your feet shoulder width apart. 2. Secure an exercise band to a stable object in front of you so the band is at or above shoulder height. 3. Hold one end of the exercise band in each hand. 4. Straighten your elbows and lift your hands up to shoulder height. 5. Step back, away from the secured end of the exercise band, until the band stretches. 6. Squeeze your shoulder blades together and pull your hands down to the sides of your thighs. Stop when your hands are straight down by your sides. This is shoulder extension. Do not let your hands go behind your body. 7. Hold for __________ seconds. 8. Slowly return to the starting position. Repeat __________ times. Complete this exercise __________ times a day. Prone shoulder external rotation 1. Lie on your abdomen on a firm bed so your left / right forearm hangs over the edge of the bed and your upper arm is on the bed, straight out from your body. This is the prone position. ? Your elbow should be bent. ?   Your palm should be facing your feet. 2. If instructed, hold a __________ weight in your hand. 3. Squeeze your shoulder blade toward the middle of your back. Do not let your shoulder lift toward your ear. 4. Keep your elbow bent in a 90-degree angle (right angle) while you slowly move your forearm up toward the ceiling. Move your forearm up to the height of the bed, toward your head. This is external rotation. ? Your upper arm should not move. ? At the top of the movement, your palm should face the floor. 5. Hold for __________  seconds. 6. Slowly return to the starting position and relax your muscles. Repeat __________ times. Complete this exercise __________ times a day. Rowing scapular retraction This is an exercise in which the shoulder blades (scapulae) are pulled toward each other (retraction). 1. Sit in a stable chair without armrests, or stand up. 2. Secure an exercise band to a stable object in front of you so the band is at shoulder height. 3. Hold one end of the exercise band in each hand. Your palms should face down. 4. Bring your arms out straight in front of you. 5. Step back, away from the secured end of the exercise band, until the band stretches. 6. Pull the band backward. As you do this, bend your elbows and squeeze your shoulder blades together, but avoid letting the rest of your body move. Do not shrug your shoulders upward while you do this. 7. Stop when your elbows are at your sides or slightly behind your body. 8. Hold for __________ seconds. 9. Slowly straighten your arms to return to the starting position. Repeat __________ times. Complete this exercise __________ times a day. Posture and body mechanics Good posture and healthy body mechanics can help to relieve stress in your body's tissues and joints. Body mechanics refers to the movements and positions of your body while you do your daily activities. Posture is part of body mechanics. Good posture means:  Your spine is in its natural S-curve position (neutral).  Your shoulders are pulled back slightly.  Your head is not tipped forward. Follow these guidelines to improve your posture and body mechanics in your everyday activities. Standing   When standing, keep your spine neutral and your feet about hip width apart. Keep a slight bend in your knees. Your ears, shoulders, and hips should line up with each other.  When you do a task in which you lean forward while standing in one place for a long time, place one foot up on a stable  object that is 2-4 inches (5-10 cm) high, such as a footstool. This helps keep your spine neutral. Sitting   When sitting, keep your spine neutral and keep your feet flat on the floor. Use a footrest, if necessary, and keep your thighs parallel to the floor. Avoid rounding your shoulders, and avoid tilting your head forward.  When working at a desk or a computer, keep your desk at a height where your hands are slightly lower than your elbows. Slide your chair under your desk so you are close enough to maintain good posture.  When working at a computer, place your monitor at a height where you are looking straight ahead and you do not have to tilt your head forward or downward to look at the screen. Resting When lying down and resting, avoid positions that are most painful for you.  If you have pain with activities such as sitting, bending, stooping, or squatting (flexion-basedactivities), lie   in a position in which your body does not bend very much. For example, avoid curling up on your side with your arms and knees near your chest (fetal position).  If you have pain with activities such as standing for a long time or reaching with your arms (extension-basedactivities), lie with your spine in a neutral position and bend your knees slightly. Try the following positions: ? Lie on your side with a pillow between your knees. ? Lie on your back with a pillow under your knees.  Lifting   When lifting objects, keep your feet at least shoulder width apart and tighten your abdominal muscles.  Bend your knees and hips and keep your spine neutral. It is important to lift using the strength of your legs, not your back. Do not lock your knees straight out.  Always ask for help to lift heavy or awkward objects. This information is not intended to replace advice given to you by your health care provider. Make sure you discuss any questions you have with your health care provider. Document Revised:  05/03/2018 Document Reviewed: 02/18/2018 Elsevier Patient Education  2020 Elsevier Inc.  

## 2019-06-13 ENCOUNTER — Other Ambulatory Visit: Payer: Self-pay | Admitting: Family Medicine

## 2019-06-13 DIAGNOSIS — M546 Pain in thoracic spine: Secondary | ICD-10-CM

## 2019-06-13 NOTE — Telephone Encounter (Signed)
Requested medication (s) are due for refill today: yes  Requested medication (s) are on the active medication list: yes  Last refill:  06/06/2019  Future visit scheduled: yes  Notes to clinic:  refill cannot be delegated    Requested Prescriptions  Pending Prescriptions Disp Refills   baclofen (LIORESAL) 10 MG tablet [Pharmacy Med Name: BACLOFEN 10MG  TABLETS] 60 tablet     Sig: TAKE 1 TO 2 TABLETS(10 TO 20 MG) BY MOUTH THREE TIMES DAILY AS NEEDED FOR MUSCLE SPASMS      Not Delegated - Analgesics:  Muscle Relaxants Failed - 06/13/2019  3:27 AM      Failed - This refill cannot be delegated      Passed - Valid encounter within last 6 months    Recent Outpatient Visits           1 week ago Dyslipidemia   [facility], MD   4 months ago Left otitis media, unspecified otitis media type   [facility], FNP   4 months ago Vertigo   [facility], FNP   5 months ago Upper respiratory tract infection, unspecified type   [facility], PA-C   6 months ago Well adult exam   [facility], MD       Future Appointments             In 1 month Alba Cory, Richardo Hanks, MD [facility]   In 5 months [facility], MD [facility], [facility]

## 2019-06-20 ENCOUNTER — Encounter: Payer: Self-pay | Admitting: Urology

## 2019-06-22 ENCOUNTER — Other Ambulatory Visit: Payer: Self-pay | Admitting: Family Medicine

## 2019-06-22 DIAGNOSIS — M546 Pain in thoracic spine: Secondary | ICD-10-CM

## 2019-06-22 NOTE — Telephone Encounter (Signed)
Requested medications are due for refill today?  Yes - This refill cannot be delegated.    Requested medications are on active medication list?  Yes  Last Refill:   06/15/2019  # 60 with no refills   Future visit scheduled?  Yes  Notes to Clinic:  This refill cannot be delegated.

## 2019-06-30 ENCOUNTER — Telehealth: Payer: Self-pay | Admitting: Family Medicine

## 2019-06-30 MED ORDER — EPINEPHRINE 0.3 MG/0.3ML IJ SOAJ: 0.3000 mg | INTRAMUSCULAR | 0 refills | Status: DC | PRN

## 2019-06-30 NOTE — Telephone Encounter (Signed)
Copied from CRM [id number]. Topic: General - Other >> Jun 30, 2019 11:05 AM Tamela Oddi wrote: Reason for CRM: Patient called to request that his epi pens script be renewed.  He stated he hasn't needed a new script in quite a while so that is why it is not on his med list.  Please renew and send to his local pharmacy. If there are any questions, patient can be reached at [phone]

## 2019-07-02 ENCOUNTER — Other Ambulatory Visit: Payer: Self-pay | Admitting: Family Medicine

## 2019-07-02 DIAGNOSIS — M546 Pain in thoracic spine: Secondary | ICD-10-CM

## 2019-07-02 NOTE — Telephone Encounter (Signed)
Requested medications are due for refill today?  Yes -  This refill cannot be delegated.    Requested medications are on active medication list?  Yes  Last Refill:   06/24/2019  # 60 with no refills.    Future visit scheduled?  Yes  Notes to Clinic:  This medication refill cannot be delegated.

## 2019-07-03 ENCOUNTER — Other Ambulatory Visit: Payer: Self-pay | Admitting: Family Medicine

## 2019-07-03 DIAGNOSIS — M546 Pain in thoracic spine: Secondary | ICD-10-CM

## 2019-07-23 ENCOUNTER — Other Ambulatory Visit: Payer: Self-pay | Admitting: Family Medicine

## 2019-07-23 DIAGNOSIS — G4709 Other insomnia: Secondary | ICD-10-CM

## 2019-08-01 ENCOUNTER — Other Ambulatory Visit: Payer: Self-pay | Admitting: Family Medicine

## 2019-08-01 DIAGNOSIS — M546 Pain in thoracic spine: Secondary | ICD-10-CM

## 2019-08-11 ENCOUNTER — Ambulatory Visit: Payer: 59 | Admitting: Urology

## 2019-08-13 ENCOUNTER — Ambulatory Visit: Payer: 59 | Admitting: Urology

## 2019-08-31 ENCOUNTER — Other Ambulatory Visit: Payer: Self-pay | Admitting: Family Medicine

## 2019-08-31 DIAGNOSIS — M546 Pain in thoracic spine: Secondary | ICD-10-CM

## 2019-08-31 NOTE — Telephone Encounter (Signed)
Requested Prescriptions  Pending Prescriptions Disp Refills  . meloxicam (MOBIC) 15 MG tablet [Pharmacy Med Name: MELOXICAM 15MG  TABLETS] 30 tablet 0    Sig: TAKE 1 TABLET(15 MG) BY MOUTH DAILY     Analgesics:  COX2 Inhibitors Passed - 08/31/2019  9:09 AM      Passed - HGB in normal range and within 360 days    Hemoglobin  Date Value Ref Range Status  10/28/2018 14.4 13.0 - 17.0 g/dL Final  12/28/2018 22/02/5425 12.6 - 17.7 g/dL Final         Passed - Cr in normal range and within 360 days    Creat  Date Value Ref Range Status  12/05/2018 1.11 0.60 - 1.35 mg/dL Final         Passed - Patient is not pregnant      Passed - Valid encounter within last 12 months    Recent Outpatient Visits          2 months ago Dyslipidemia   [facility], [place]ug[place], MD   7 months ago Left otitis media, unspecified otitis media type   [facility], FNP   7 months ago Vertigo   [facility], FNP   8 months ago Upper respiratory tract infection, unspecified type   [facility], PA-C   8 months ago Well adult exam   [facility], MD      Future Appointments            In 3 months Alba Cory, Carlynn Purl, MD [facility], [facility]

## 2019-09-06 ENCOUNTER — Other Ambulatory Visit: Payer: Self-pay | Admitting: Family Medicine

## 2019-09-06 DIAGNOSIS — F325 Major depressive disorder, single episode, in full remission: Secondary | ICD-10-CM

## 2019-09-06 NOTE — Telephone Encounter (Signed)
Requested Prescriptions  Pending Prescriptions Disp Refills   escitalopram (LEXAPRO) 20 MG tablet [Pharmacy Med Name: ESCITALOPRAM 20MG  TABLETS] 90 tablet 0    Sig: TAKE 1 TABLET(20 MG) BY MOUTH DAILY     Psychiatry:  Antidepressants - SSRI Passed - 09/06/2019  9:11 AM      Passed - Completed PHQ-2 or PHQ-9 in the last 360 days.      Passed - Valid encounter within last 6 months    Recent Outpatient Visits          3 months ago Dyslipidemia   [facility], Leugnies, MD   7 months ago Left otitis media, unspecified otitis media type   [facility], FNP   7 months ago Vertigo   [facility], FNP   8 months ago Upper respiratory tract infection, unspecified type   [facility], PA-C   9 months ago Well adult exam   [facility], MD      Future Appointments            In 3 months Alba Cory, Carlynn Purl, MD [facility], [facility]

## 2019-09-16 ENCOUNTER — Other Ambulatory Visit: Payer: Self-pay

## 2019-09-16 ENCOUNTER — Encounter: Payer: Self-pay | Admitting: Emergency Medicine

## 2019-09-16 ENCOUNTER — Ambulatory Visit
Admission: EM | Admit: 2019-09-16 | Discharge: 2019-09-16 | Disposition: A | Discharge status: Home / Self Care | Payer: 59 | Attending: Family Medicine | Admitting: Family Medicine

## 2019-09-16 DIAGNOSIS — R197 Diarrhea, unspecified: Secondary | ICD-10-CM

## 2019-09-16 MED ORDER — DIPHENOXYLATE-ATROPINE 2.5-0.025 MG PO TABS
1.0000 | ORAL_TABLET | Freq: Four times a day (QID) | ORAL | 0 refills | Status: DC | PRN
Start: 2019-09-16 — End: 2019-10-29

## 2019-09-16 NOTE — Discharge Instructions (Signed)
Medication as prescribed. ° °Okay to return to work. ° °Take care ° °Dr. Ember Henrikson  °

## 2019-09-16 NOTE — ED Provider Notes (Signed)
[facility]    CSN: [id number] Arrival date & time: 09/16/19  1128      History   Chief Complaint Chief Complaint  Patient presents with  . Diarrhea  . Abdominal Pain   HPI  34 year old male presents with the above complaints.  Patient reports that he developed diarrhea and lower abdominal cramping yesterday. She called out of work and states that he needs a note that he can return to work. No fever. No respiratory symptoms. No other reported symptoms at this time. No medications or interventions tried. No other complaints.  Past Medical History:  Diagnosis Date  . Allergy    Seasonal  . GERD (gastroesophageal reflux disease)   . History of kidney stones    x4  . Insomnia     Patient Active Problem List   Diagnosis Date Noted  . Ankle instability 12/03/2017  . Sprain of ankle 11/19/2017  . Metabolic syndrome 12/13/2016  . Moderate major depression (HCC) 11/30/2016  . History of vasculitis of skin 05/20/2015  . GERD without esophagitis 11/10/2014  . Seasonal allergic rhinitis 11/10/2014  . History of nephrolithiasis 11/10/2014  . Obesity (BMI 30-39.9) 11/10/2014  . Tobacco abuse 11/10/2014  . Snoring 11/10/2014  . Insomnia 11/10/2014    Past Surgical History:  Procedure Laterality Date  . FACIAL RECONSTRUCTION SURGERY  01/13/2008   After a car fell on him while working on it       Home Medications    Prior to Admission medications   Medication Sig Start Date End Date Taking? Authorizing Provider  escitalopram (LEXAPRO) 20 MG tablet TAKE 1 TABLET(20 MG) BY MOUTH DAILY 09/06/19  Yes Sowles, Danna Hefty, MD  omeprazole (PRILOSEC) 20 MG capsule Take 20 mg by mouth daily.   Yes [provider]  QUEtiapine (SEROQUEL) 25 MG tablet Take 1 tablet (25 mg total) by mouth at bedtime. 06/06/19  Yes Sowles, Danna Hefty, MD  diphenoxylate-atropine (LOMOTIL) 2.5-0.025 MG tablet Take 1 tablet by mouth 4 (four) times daily as needed for diarrhea or loose  stools. 09/16/19   Tommie Sams, DO  EPINEPHrine 0.3 mg/0.3 mL IJ SOAJ injection Inject 0.3 mLs (0.3 mg total) into the muscle as needed for anaphylaxis. 06/30/19   Alba Cory, MD  fluticasone (FLONASE) 50 MCG/ACT nasal spray Place 2 sprays into both nostrils daily. 01/18/19 04/01/19  Rodriguez-Southworth, Nettie Elm, PA-C    Family History Family History  Problem Relation Age of Onset  . Varicose Veins Mother   . Endometriosis Mother   . Drug abuse Father   . GI Disease Father   . Heart attack Maternal Grandmother   . Breast cancer Maternal Grandmother   . Thyroid cancer Maternal Grandmother   . Congestive Heart Failure Maternal Grandfather   . Heart attack Maternal Grandfather   . Stroke Maternal Grandfather   . Breast cancer Paternal Grandmother   . Heart attack Paternal Grandfather     Social History Social History   Tobacco Use  . Smoking status: Former Smoker    Packs/day: 0.25    Years: 15.00    Pack years: 3.75    Types: Cigarettes, E-cigarettes    Start date: 11/10/1999    Quit date: 06/05/2017    Years since quitting: 2.2  . Smokeless tobacco: Never Used  . Tobacco comment: e cig mostly  Vaping Use  . Vaping Use: Every day  . Start date: 11/28/2011  . Substances: Nicotine, Flavoring  . Devices: Box Mod  Substance Use Topics  . Alcohol use:  No    Alcohol/week: 0.0 standard drinks  . Drug use: No     Allergies   Bee venom and Penicillins   Review of Systems Review of Systems  Constitutional: Negative for fever.  HENT: Negative.   Respiratory: Negative.   Gastrointestinal: Positive for abdominal pain and diarrhea.   Physical Exam Triage Vital Signs ED Triage Vitals  Enc Vitals Group     BP 09/16/19 1220 113/75     Pulse Rate 09/16/19 1219 75     Resp 09/16/19 1219 18     Temp 09/16/19 1219 98 F (36.7 C)     Temp Source 09/16/19 1219 Oral     SpO2 09/16/19 1219 98 %     Weight 09/16/19 1217 265 lb (120.2 kg)     Height 09/16/19 1217 6\' 2"  (1.88  m)     Head Circumference --      Peak Flow --      Pain Score 09/16/19 1217 1     Pain Loc --      Pain Edu? --      Excl. in GC? --    Updated Vital Signs BP 113/75   Pulse 75   Temp 98 F (36.7 C) (Oral)   Resp 18   Ht 6\' 2"  (1.88 m)   Wt 120.2 kg   SpO2 98%   BMI 34.02 kg/m   Visual Acuity Right Eye Distance:   Left Eye Distance:   Bilateral Distance:    Right Eye Near:   Left Eye Near:    Bilateral Near:     Physical Exam Vitals and nursing note reviewed.  Constitutional:      General: He is not in acute distress.    Appearance: Normal appearance. He is not ill-appearing.  HENT:     Head: Normocephalic and atraumatic.  Eyes:     General:        Right eye: No discharge.        Left eye: No discharge.     Conjunctiva/sclera: Conjunctivae normal.  Cardiovascular:     Rate and Rhythm: Normal rate and regular rhythm.     Heart sounds: No murmur heard.   Pulmonary:     Effort: Pulmonary effort is normal.     Breath sounds: Normal breath sounds. No wheezing, rhonchi or rales.  Abdominal:     General: There is no distension.     Palpations: Abdomen is soft.     Tenderness: There is no abdominal tenderness.  Neurological:     Mental Status: He is alert.  Psychiatric:        Mood and Affect: Mood normal.        Behavior: Behavior normal.      UC Treatments / Results  Labs (all labs ordered are listed, but only abnormal results are displayed) Labs Reviewed - No data to display  EKG   Radiology No results found.  Procedures Procedures (including critical care time)  Medications Ordered in UC Medications - No data to display  Initial Impression / Assessment and Plan / UC Course  I have reviewed the triage vital signs and the nursing notes.  Pertinent labs & imaging results that were available during my care of the patient were reviewed by me and considered in my medical decision making (see chart for details).    33 year old male presents  with diarrhea. Okay to return to work. Work note given. Lomotil as needed.  Final Clinical Impressions(s) / UC Diagnoses  Final diagnoses:  Diarrhea, unspecified type     Discharge Instructions     Medication as prescribed.  Okay to return to work.  Take care  Dr. Adriana Simas    ED Prescriptions    Medication Sig Dispense Auth. Provider   diphenoxylate-atropine (LOMOTIL) 2.5-0.025 MG tablet Take 1 tablet by mouth 4 (four) times daily as needed for diarrhea or loose stools. 30 tablet Tommie Sams, DO     PDMP not reviewed this encounter.   Tommie Sams, [place] 09/16/19 1354

## 2019-09-16 NOTE — ED Triage Notes (Signed)
Patient c/o diarrhea and abdominal cramping that started yesterday. He states he called out of work and needs a Chiropractor note to return to work.

## 2019-09-17 ENCOUNTER — Ambulatory Visit: Admission: EM | Admit: 2019-09-17 | Discharge: 2019-09-17 | Discharge status: Against Medical Advice | Payer: 59

## 2019-09-17 ENCOUNTER — Telehealth: Payer: Self-pay | Admitting: Family Medicine

## 2019-09-17 MED ORDER — LOPERAMIDE HCL 2 MG PO CAPS
2.0000 mg | ORAL_CAPSULE | Freq: Four times a day (QID) | ORAL | 0 refills | Status: DC | PRN
Start: 2019-09-17 — End: 2019-10-29

## 2019-09-17 NOTE — Telephone Encounter (Signed)
Insurance did not cover medication. Sent in Imodium.  Everlene Other DO [facility]

## 2019-10-04 ENCOUNTER — Other Ambulatory Visit: Payer: Self-pay | Admitting: Family Medicine

## 2019-10-04 DIAGNOSIS — M546 Pain in thoracic spine: Secondary | ICD-10-CM

## 2019-10-27 ENCOUNTER — Ambulatory Visit: Payer: Self-pay | Admitting: *Deleted

## 2019-10-27 ENCOUNTER — Encounter: Payer: Self-pay | Admitting: Family Medicine

## 2019-10-27 NOTE — Telephone Encounter (Signed)
Called notified patient

## 2019-10-27 NOTE — Telephone Encounter (Signed)
Pt.notified

## 2019-10-27 NOTE — Telephone Encounter (Signed)
Pt called with complaints of having a "mental break down"; he thinks he needs his depression medication increased or changed; he started uncontrollable crying at work; he said his symptoms have been gradual; he said he has been forgetful; the patient is not sure how long this has been going on; recommendations made per nurse triage protocol; he verbalized understanding; the pt sees Dr Carlynn Purl, [facility]; she does not have appt within the time frame per nurse triage protocol; spoke with [facility] regarding scheduling; she requests for info be routed to office and pt will be placed on wait list because provider is out of the office until 09/30/19; pt notified and verbalized understanding; he was also given the number to [facility]; pt again verbalized understanding; will route to office per request. Reason for Disposition . Symptoms interfere with work or school  Answer Assessment - Initial Assessment Questions 1. CONCERN: "What happened that made you call today?"     Crying at work 2. DEPRESSION SYMPTOM SCREENING: "How are you feeling overall?" (e.g., decreased energy, increased sleeping or difficulty sleeping, difficulty concentrating, feelings of sadness, guilt, hopelessness, or worthlessness)     sad 3. RISK OF HARM - SUICIDAL IDEATION:  "Do you ever have thoughts of hurting or killing yourself?"  (e.g., yes, no, no but preoccupation with thoughts about death)   - INTENT:  "Do you have thoughts of hurting or killing yourself right NOW?" (no   - PLAN: "Do you have a specific plan for how you would do this?" (e.g., gun, knife, overdose, no plan, N/A)    no 4. RISK OF HARM - HOMICIDAL IDEATION:  "Do you ever have thoughts of hurting or killing someone else?"  (e.g., yes, no, no but preoccupation with thoughts about death)   - INTENT:  "Do you have thoughts of hurting or killing someone right NOW?" no   - PLAN: "Do you have a specific plan for how you would do this?" (e.g., gun, knife, no plan,  N/A)      n/a 5. FUNCTIONAL IMPAIRMENT: "How have things been going for you overall? Have you had more difficulty than usual doing your normal daily activities?"  (e.g., better, same, worse; self-care, school, work, interactions)     worse 6. SUPPORT: "Who is with you now?" "Who do you live with?" "Do you have family or friends who you can talk to?"      Yes wife works at home 7. THERAPIST: "Do you have a counselor or therapist? Name?"     no 8. STRESSORS: "Has there been any new stress or recent changes in your life?"   no 9. ALCOHOL USE OR SUBSTANCE USE (DRUG USE): "Do you drink alcohol or use any illegal drugs?"     no 10. OTHER: "Do you have any other physical symptoms right now?" (e.g., fever)      Headache; brain feels "muttled, hard to focus" 11. PREGNANCY: "Is there any chance you are pregnant?" "When was your last menstrual period?"       n/a  Protocols used: DEPRESSION-A-AH

## 2019-10-28 NOTE — Telephone Encounter (Signed)
Copied from CRM [id number]. Topic: General - Other >> Oct 28, 2019  9:43 AM [name] wrote: Reason for CRM: Pt stated he was told he needed to see pcp in 24 hours but he has not heard back from anyone regarding an appt. Pt requests call back >> Oct 28, 2019  9:54 AM Myatt, Efraim Kaufmann wrote: Pt can only see Dr Carlynn Purl due to the medications he is asking for. Please advise where we can schedule him

## 2019-10-29 ENCOUNTER — Encounter: Payer: Self-pay | Admitting: Family Medicine

## 2019-10-29 ENCOUNTER — Ambulatory Visit: Payer: 59 | Admitting: Family Medicine

## 2019-10-29 ENCOUNTER — Other Ambulatory Visit: Payer: Self-pay

## 2019-10-29 VITALS — BP 110/70 | HR 93 | Temp 98.1°F | Resp 18 | Ht 73.0 in | Wt 254.7 lb

## 2019-10-29 DIAGNOSIS — R69 Illness, unspecified: Secondary | ICD-10-CM | POA: Diagnosis not present

## 2019-10-29 DIAGNOSIS — F331 Major depressive disorder, recurrent, moderate: Secondary | ICD-10-CM

## 2019-10-29 DIAGNOSIS — Z23 Encounter for immunization: Secondary | ICD-10-CM

## 2019-10-29 MED ORDER — AMPHETAMINE-DEXTROAMPHET ER 10 MG PO CP24: 10.0000 mg | ORAL_CAPSULE | Freq: Every day | ORAL | 0 refills | Status: DC

## 2019-10-29 NOTE — Progress Notes (Signed)
Name: Timothy Fleming   MRN: [id number]    DOB: February 06, 1985   Date:10/29/2019       Progress Note  Subjective  Chief Complaint  Chief Complaint  Patient presents with  . Depression    HPI  DepressionMajor: he was doing well, however two days ago his boss called him in to his office because he had noticed he has been making mild mistakes and being forgetful at work. He states he cried during the meeting and was unable to stop crying afterwards and had to leave work and go home. He states he has been feeling very down, no energy , still has crying spells, lack of motivation, doesn't even feel like walking. His wife and mother are wondering if it is related to his diet, he stopped eating starches and sugar over the past couple of weeks. He lost 10-15 lbs since started on the diet. He didn't go to work on Tuesday because he had an eye appointment on Tuesday.    Patient Active Problem List   Diagnosis Date Noted  . Ankle instability 12/03/2017  . Sprain of ankle 11/19/2017  . Metabolic syndrome 12/13/2016  . Moderate major depression (HCC) 11/30/2016  . History of vasculitis of skin 05/20/2015  . GERD without esophagitis 11/10/2014  . Seasonal allergic rhinitis 11/10/2014  . History of nephrolithiasis 11/10/2014  . Obesity (BMI 30-39.9) 11/10/2014  . Tobacco abuse 11/10/2014  . Snoring 11/10/2014  . Insomnia 11/10/2014    Past Surgical History:  Procedure Laterality Date  . FACIAL RECONSTRUCTION SURGERY  01/13/2008   After a car fell on him while working on it    Family History  Problem Relation Age of Onset  . Varicose Veins Mother   . Endometriosis Mother   . Drug abuse Father   . GI Disease Father   . Heart attack Maternal Grandmother   . Breast cancer Maternal Grandmother   . Thyroid cancer Maternal Grandmother   . Congestive Heart Failure Maternal Grandfather   . Heart attack Maternal Grandfather   . Stroke Maternal Grandfather   . Breast cancer Paternal Grandmother    . Heart attack Paternal Grandfather     Social History   Tobacco Use  . Smoking status: Former Smoker    Packs/day: 0.25    Years: 15.00    Pack years: 3.75    Types: Cigarettes, E-cigarettes    Start date: 11/10/1999    Quit date: 06/05/2017    Years since quitting: 2.4  . Smokeless tobacco: Never Used  . Tobacco comment: e cig mostly  Substance Use Topics  . Alcohol use: No    Alcohol/week: 0.0 standard drinks     Current Outpatient Medications:  .  EPINEPHrine 0.3 mg/0.3 mL IJ SOAJ injection, Inject 0.3 mLs (0.3 mg total) into the muscle as needed for anaphylaxis., Disp: 2 each, Rfl: 0 .  escitalopram (LEXAPRO) 20 MG tablet, TAKE 1 TABLET(20 MG) BY MOUTH DAILY, Disp: 90 tablet, Rfl: 0 .  omeprazole (PRILOSEC) 20 MG capsule, Take 20 mg by mouth daily., Disp: , Rfl:  .  QUEtiapine (SEROQUEL) 25 MG tablet, Take 1 tablet (25 mg total) by mouth at bedtime., Disp: 90 tablet, Rfl: 1  Allergies  Allergen Reactions  . Bee Venom Anaphylaxis, Shortness Of Breath and Swelling  . Penicillins Hives    vasculitis    I personally reviewed active problem list, medication list, allergies, family history, social history, health maintenance with the patient/caregiver today.   ROS  Constitutional: Negative for  fever or weight change.  Respiratory: Negative for cough and shortness of breath.   Cardiovascular: Negative for chest pain or palpitations.  Gastrointestinal: Negative for abdominal pain, no bowel changes.  Musculoskeletal: Negative for gait problem or joint swelling.  Skin: Negative for rash.  Neurological: Negative for dizziness or headache.  No other specific complaints in a complete review of systems (except as listed in HPI above).  Objective  Vitals:   10/29/19 0810  BP: 110/70  Pulse: 93  Resp: 18  Temp: 98.1 F (36.7 C)  SpO2: 99%  Weight: 254 lb 11.2 oz (115.5 kg)    Body mass index is 32.7 kg/m.  Physical Exam  Constitutional: Patient appears  well-developed and well-nourished. Obese  No distress.  HEENT: head atraumatic, normocephalic, pupils equal and reactive to light,  neck supple Cardiovascular: Normal rate, regular rhythm and normal heart sounds.  No murmur heard. No BLE edema. Pulmonary/Chest: Effort normal and breath sounds normal. No respiratory distress. Abdominal: Soft.  There is no tenderness. Psychiatric: Patient has a depressed mood. behavior is normal. Judgment and thought content normal.  PHQ2/9: Depression screen [facility] 2/9 10/29/2019 06/06/2019 01/30/2019 01/28/2019 01/02/2019  Decreased Interest 3 0 0 0 0  Down, Depressed, Hopeless 3 0 0 0 0  PHQ - 2 Score 6 0 0 0 0  Altered sleeping 3 3 0 0 0  Tired, decreased energy 2 1 0 0 0  Change in appetite 0 0 0 0 0  Feeling bad or failure about yourself  1 0 0 0 0  Trouble concentrating 0 0 0 0 0  Moving slowly or fidgety/restless 2 0 0 0 0  Suicidal thoughts 0 0 0 0 0  PHQ-9 Score 14 4 0 0 0  Difficult doing work/chores - Not difficult at all Not difficult at all Not difficult at all Not difficult at all  Some recent data might be hidden    phq 9 is positive   Fall Risk: Fall Risk  10/29/2019 06/06/2019 01/30/2019 01/28/2019 01/02/2019  Falls in the past year? 0 1 0 0 0  Number falls in past yr: - 0 0 0 0  Injury with Fall? - 1 0 0 0  Comment - - - - -  Follow up - - Falls evaluation completed Falls evaluation completed -     Functional Status Survey: Is the patient deaf or have difficulty hearing?: No Does the patient have difficulty seeing, even when wearing glasses/contacts?: No Does the patient have difficulty concentrating, remembering, or making decisions?: Yes Does the patient have difficulty walking or climbing stairs?: No Does the patient have difficulty dressing or bathing?: No Does the patient have difficulty doing errands alone such as visiting a doctor's office or shopping?: No   Assessment & Plan  1. Moderate episode of recurrent major depressive  disorder (HCC)  - amphetamine-dextroamphetamine (ADDERALL XR) 10 MG 24 hr capsule; Take 1 capsule (10 mg total) by mouth daily.  Dispense: 30 capsule; Refill: 0  Discussed referral to therapist and or psychiatrist  Since main symptom is lack of motivation and already on Lexapro we will add a stimulant, discussed side effects and also that it is a controlled substance

## 2019-10-30 ENCOUNTER — Encounter: Payer: Self-pay | Admitting: Family Medicine

## 2019-11-28 ENCOUNTER — Other Ambulatory Visit: Payer: Self-pay

## 2019-11-28 ENCOUNTER — Other Ambulatory Visit: Payer: Self-pay | Admitting: Family Medicine

## 2019-11-28 DIAGNOSIS — F331 Major depressive disorder, recurrent, moderate: Secondary | ICD-10-CM

## 2019-11-28 NOTE — Telephone Encounter (Signed)
Requested medication (s) are due for refill today - yes  Requested medication (s) are on the active medication list -yes  Future visit scheduled -yes  Last refill: 10/29/19  Notes to clinic: Request non delegated Rx  Requested Prescriptions  Pending Prescriptions Disp Refills   amphetamine-dextroamphetamine (ADDERALL XR) 10 MG 24 hr capsule 30 capsule 0    Sig: Take 1 capsule (10 mg total) by mouth daily.      Not Delegated - Psychiatry:  Stimulants/ADHD Failed - 11/28/2019  3:19 PM      Failed - This refill cannot be delegated      Failed - Urine Drug Screen completed in last 360 days      Passed - Valid encounter within last 3 months    Recent Outpatient Visits           1 month ago Moderate episode of recurrent major depressive disorder [facility])   [facility] Alba Cory, MD   5 months ago Dyslipidemia   [facility] [place], Danna Hefty, MD   10 months ago Left otitis media, unspecified otitis media type   [facility] Doren Custard, FNP   10 months ago Vertigo   [facility] Doren Custard, [place]   11 months ago Upper respiratory tract infection, unspecified type   [facility] Danelle Berry, PA-C       Future Appointments             In 1 week Alba Cory, MD [facility], [facility]                Requested Prescriptions  Pending Prescriptions Disp Refills   amphetamine-dextroamphetamine (ADDERALL XR) 10 MG 24 hr capsule 30 capsule 0    Sig: Take 1 capsule (10 mg total) by mouth daily.      Not Delegated - Psychiatry:  Stimulants/ADHD Failed - 11/28/2019  3:19 PM      Failed - This refill cannot be delegated      Failed - Urine Drug Screen completed in last 360 days      Passed - Valid encounter within last 3 months    Recent Outpatient Visits           1 month ago Moderate episode of recurrent major depressive disorder [facility])   [facility]  [facility] Alba Cory, MD   5 months ago Dyslipidemia   [facility] [employer], Danna Hefty, MD   10 months ago Left otitis media, unspecified otitis media type   [facility] Doren Custard, FNP   10 months ago Vertigo   [facility] Doren Custard, [place]   11 months ago Upper respiratory tract infection, unspecified type   [facility] Danelle Berry, PA-C       Future Appointments             In 1 week Alba Cory, MD [facility], [facility]

## 2019-11-28 NOTE — Telephone Encounter (Signed)
PT need a refill  amphetamine-dextroamphetamine (ADDERALL XR) 10 MG 24 hr capsule [[id number]]  [id number] - [place], [place] - [address] AT [facility] [address]  [address] [place] [address]  Phone: [phone] Fax: [phone]

## 2019-11-28 NOTE — Addendum Note (Signed)
Addended by: Elby Beck F on: 11/28/2019 03:19 PM   Modules accepted: Orders

## 2019-12-06 ENCOUNTER — Other Ambulatory Visit: Payer: Self-pay | Admitting: Family Medicine

## 2019-12-06 DIAGNOSIS — F325 Major depressive disorder, single episode, in full remission: Secondary | ICD-10-CM

## 2019-12-06 NOTE — Telephone Encounter (Signed)
Requested Prescriptions  Pending Prescriptions Disp Refills  . escitalopram (LEXAPRO) 20 MG tablet [Pharmacy Med Name: ESCITALOPRAM 20MG  TABLETS] 90 tablet 0    Sig: TAKE 1 TABLET(20 MG) BY MOUTH DAILY     Psychiatry:  Antidepressants - SSRI Passed - 12/06/2019 10:32 AM      Passed - Completed PHQ-2 or PHQ-9 in the last 360 days      Passed - Valid encounter within last 6 months    Recent Outpatient Visits          1 month ago Moderate episode of recurrent major depressive disorder [facility])   [facility], MD   6 months ago Dyslipidemia   [facility] [place], [place], MD   10 months ago Left otitis media, unspecified otitis media type   [facility], FNP   10 months ago Vertigo   [facility], Doren Custard   11 months ago Upper respiratory tract infection, unspecified type   [facility], PA-C      Future Appointments            In 3 days Danelle Berry, MD [facility], [facility]

## 2019-12-08 NOTE — Patient Instructions (Signed)
Preventive Care 19-35 Years Old, Male Preventive care refers to lifestyle choices and visits with your health care provider that can promote health and wellness. This includes:  A yearly physical exam. This is also called an annual well check.  Regular dental and eye exams.  Immunizations.  Screening for certain conditions.  Healthy lifestyle choices, such as eating a healthy diet, getting regular exercise, not using drugs or products that contain nicotine and tobacco, and limiting alcohol use. What can I expect for my preventive care visit? Physical exam Your health care provider will check:  Height and weight. These may be used to calculate body mass index (BMI), which is a measurement that tells if you are at a healthy weight.  Heart rate and blood pressure.  Your skin for abnormal spots. Counseling Your health care provider may ask you questions about:  Alcohol, tobacco, and drug use.  Emotional well-being.  Home and relationship well-being.  Sexual activity.  Eating habits.  Work and work Statistician. What immunizations do I need?  Influenza (flu) vaccine  This is recommended every year. Tetanus, diphtheria, and pertussis (Tdap) vaccine  You may need a Td booster every 10 years. Varicella (chickenpox) vaccine  You may need this vaccine if you have not already been vaccinated. Human papillomavirus (HPV) vaccine  If recommended by your health care provider, you may need three doses over 6 months. Measles, mumps, and rubella (MMR) vaccine  You may need at least one dose of MMR. You may also need a second dose. Meningococcal conjugate (MenACWY) vaccine  One dose is recommended if you are 45-76 years old and a Market researcher living in a residence hall, or if you have one of several medical conditions. You may also need additional booster doses. Pneumococcal conjugate (PCV13) vaccine  You may need this if you have certain conditions and were not  previously vaccinated. Pneumococcal polysaccharide (PPSV23) vaccine  You may need one or two doses if you smoke cigarettes or if you have certain conditions. Hepatitis A vaccine  You may need this if you have certain conditions or if you travel or work in places where you may be exposed to hepatitis A. Hepatitis B vaccine  You may need this if you have certain conditions or if you travel or work in places where you may be exposed to hepatitis B. Haemophilus influenzae type b (Hib) vaccine  You may need this if you have certain risk factors. You may receive vaccines as individual doses or as more than one vaccine together in one shot (combination vaccines). Talk with your health care provider about the risks and benefits of combination vaccines. What tests do I need? Blood tests  Lipid and cholesterol levels. These may be checked every 5 years starting at age 17.  Hepatitis C test.  Hepatitis B test. Screening   Diabetes screening. This is done by checking your blood sugar (glucose) after you have not eaten for a while (fasting).  Sexually transmitted disease (STD) testing. Talk with your health care provider about your test results, treatment options, and if necessary, the need for more tests. Follow these instructions at home: Eating and drinking   Eat a diet that includes fresh fruits and vegetables, whole grains, lean protein, and low-fat dairy products.  Take vitamin and mineral supplements as recommended by your health care provider.  Do not drink alcohol if your health care provider tells you not to drink.  If you drink alcohol: ? Limit how much you have to 0-2  drinks a day. ? Be aware of how much alcohol is in your drink. In the [place] one drink equals one 12 oz bottle of beer (355 mL), one 5 oz glass of wine (148 mL), or one 1 oz glass of hard liquor (44 mL). Lifestyle  Take daily care of your teeth and gums.  Stay active. Exercise for at least 30 minutes on 5 or  more days each week.  Do not use any products that contain nicotine or tobacco, such as cigarettes, e-cigarettes, and chewing tobacco. If you need help quitting, ask your health care provider.  If you are sexually active, practice safe sex. Use a condom or other form of protection to prevent STIs (sexually transmitted infections). What's next?  Go to your health care provider once a year for a well check visit.  Ask your health care provider how often you should have your eyes and teeth checked.  Stay up to date on all vaccines. This information is not intended to replace advice given to you by your health care provider. Make sure you discuss any questions you have with your health care provider. Document Revised: 01/03/2018 Document Reviewed: 01/03/2018 Elsevier Patient Education  2020 [employer].

## 2019-12-08 NOTE — Progress Notes (Signed)
Name: Timothy Fleming   MRN: [id number]    DOB: 26-Dec-1985   Date:12/09/2019       Progress Note  Subjective  Chief Complaint  Chief Complaint  Patient presents with  . Cough    x2 days  . Runny Nose    I connected with  [name] on 12/09/19 at  8:00 AM EST by telephone and verified that I am speaking with the correct person using two identifiers.   I discussed the limitations, risks, security and privacy concerns of performing an evaluation and management service by telephone and the availability of in person appointments. Staff also discussed with the patient that there may be a patient responsible charge related to this service.Patient agreed on having a telephone visit  Patient Location: in his car/parking lot  Provider Location: [facility]   HPI  He was supposed to have a CPE but we converted to virtual due to cold symptoms.   URI: he states symptoms started two days ago on right nostril, post-nasal drainage that makes him cough, no wheezing, sob or fatigue. Denies lack of taste or smells, no fever or chills. Appetite is normal. He did not get COVID-19 vaccine , but had flu vaccine on 10/29/2019. Got tested at work yesterday for COVID-19, discussed possibility of false negative, follow work protocol, and get re-tested by PCR to get a more accurate test   Major Depression: he states the combination of lexapro or Adderal XR worked well for him, increased energy level, he also noticed no longer feeling anxious and biting his finger nails, increased his level of interested and motivation. He denies any associated side effects. He would like to continue regiment   Patient Active Problem List   Diagnosis Date Noted  . Ankle instability 12/03/2017  . Sprain of ankle 11/19/2017  . Metabolic syndrome 12/13/2016  . Moderate major depression (HCC) 11/30/2016  . History of vasculitis of skin 05/20/2015  . GERD without esophagitis 11/10/2014  . Seasonal allergic rhinitis 11/10/2014  .  History of nephrolithiasis 11/10/2014  . Obesity (BMI 30-39.9) 11/10/2014  . Tobacco abuse 11/10/2014  . Snoring 11/10/2014  . Insomnia 11/10/2014    Social History   Tobacco Use  . Smoking status: Former Smoker    Packs/day: 0.25    Years: 15.00    Pack years: 3.75    Types: Cigarettes, E-cigarettes    Start date: 11/10/1999    Quit date: 06/05/2017    Years since quitting: 2.5  . Smokeless tobacco: Never Used  . Tobacco comment: e cig mostly  Substance Use Topics  . Alcohol use: No    Alcohol/week: 0.0 standard drinks     Current Outpatient Medications:  .  amphetamine-dextroamphetamine (ADDERALL XR) 10 MG 24 hr capsule, Take 1 capsule (10 mg total) by mouth daily., Disp: 30 capsule, Rfl: 0 .  EPINEPHrine 0.3 mg/0.3 mL IJ SOAJ injection, Inject 0.3 mLs (0.3 mg total) into the muscle as needed for anaphylaxis., Disp: 2 each, Rfl: 0 .  escitalopram (LEXAPRO) 20 MG tablet, TAKE 1 TABLET(20 MG) BY MOUTH DAILY, Disp: 90 tablet, Rfl: 0 .  QUEtiapine (SEROQUEL) 25 MG tablet, Take 1 tablet (25 mg total) by mouth at bedtime., Disp: 90 tablet, Rfl: 1  Allergies  Allergen Reactions  . Bee Venom Anaphylaxis, Shortness Of Breath and Swelling  . Penicillins Hives    vasculitis    I personally reviewed active problem list, medication list, allergies, family history, social history, health maintenance with the patient/caregiver today.  Video Visit from 12/09/2019 in [facility]  PHQ-9 Total Score 1     ROS  Ten systems reviewed and is negative except as mentioned in HPI   Objective  Virtual encounter, vitals not obtained.   Physical Exam  Awake, alert and oriented   Assessment & Plan  1. Upper respiratory tract infection, unspecified type  - benzonatate (TESSALON) 100 MG capsule; Take 1-2 capsules (100-200 mg total) by mouth 2 (two) times daily as needed.  Dispense: 40 capsule; Refill: 0  Advised mucinex, fluids, saline spray, get re-tested by  PCR COVID tomorrow, self quarantine for now  and call HR to find out their protocol  2. Mild episode of recurrent major depressive disorder (HCC)  - amphetamine-dextroamphetamine (ADDERALL XR) 10 MG 24 hr capsule; Take 1 capsule (10 mg total) by mouth daily.  Dispense: 30 capsule; Refill: 0 - amphetamine-dextroamphetamine (ADDERALL XR) 10 MG 24 hr capsule; Take 1 capsule (10 mg total) by mouth daily.  Dispense: 30 capsule; Refill: 0   -Red flags and when to present for emergency care or RTC including fever >101.24F, chest pain, shortness of breath, new/worsening/un-resolving symptoms, reviewed with patient at time of visit. Follow up and care instructions discussed and provided in AVS. - I discussed the assessment and treatment plan with the patient. The patient was provided an opportunity to ask questions and all were answered. The patient agreed with the plan and demonstrated an understanding of the instructions.  - The patient was advised to call back or seek an in-person evaluation if the symptoms worsen or if the condition fails to improve as anticipated.  I provided 15  minutes of non-face-to-face time during this encounter.  Ruel Favors, MD

## 2019-12-09 ENCOUNTER — Telehealth (INDEPENDENT_AMBULATORY_CARE_PROVIDER_SITE_OTHER): Payer: 59 | Admitting: Family Medicine

## 2019-12-09 ENCOUNTER — Other Ambulatory Visit: Payer: Self-pay

## 2019-12-09 ENCOUNTER — Encounter: Payer: Self-pay | Admitting: Family Medicine

## 2019-12-09 DIAGNOSIS — R69 Illness, unspecified: Secondary | ICD-10-CM | POA: Diagnosis not present

## 2019-12-09 DIAGNOSIS — J069 Acute upper respiratory infection, unspecified: Secondary | ICD-10-CM

## 2019-12-09 DIAGNOSIS — F330 Major depressive disorder, recurrent, mild: Secondary | ICD-10-CM | POA: Diagnosis not present

## 2019-12-09 MED ORDER — AMPHETAMINE-DEXTROAMPHET ER 10 MG PO CP24: 10.0000 mg | ORAL_CAPSULE | Freq: Every day | ORAL | 0 refills | Status: DC

## 2019-12-09 MED ORDER — BENZONATATE 100 MG PO CAPS: 100.0000 mg | ORAL_CAPSULE | Freq: Two times a day (BID) | ORAL | 0 refills | Status: DC | PRN

## 2019-12-10 DIAGNOSIS — Z20822 Contact with and (suspected) exposure to covid-19: Secondary | ICD-10-CM | POA: Diagnosis not present

## 2020-01-12 NOTE — Progress Notes (Signed)
Name: Timothy Fleming   MRN: [id number]    DOB: Oct 24, 1985   Date:01/13/2020       Progress Note  Subjective  Chief Complaint  Annual Exam  HPI  Patient presents for annual CPE   Right knee pain: he states he has been shopping and noticed acute pain on right lateral knee that makes him not want to bear weight on that leg. He had problems on left leg in the past but not sure what makes it feel worse. No effusion or redness.   IPSS Questionnaire (AUA-7): Over the past month.   1)  How often have you had a sensation of not emptying your bladder completely after you finish urinating?  1- Less than one time in five  2)  How often have you had to urinate again less than two hours after you finished urinating? 1- less than one time in five   3)  How often have you found you stopped and started again several times when you urinated?  0- Not at all  4) How difficult have you found it to postpone urination?  0- Not at all  5) How often have you had a weak urinary stream?  0- Not at all  6) How often have you had to push or strain to begin urination?  1- Less than one time in five  7) How many times did you most typically get up to urinate from the time you went to bed until the time you got up in the morning?  - One time  Total score:  0-7 mildly symptomatic   8-19 moderately symptomatic   20-35 severely symptomatic     Diet: discussed healthier diet  Exercise: discussed 150 minutes per week   Depression: phq 9 is positive Depression screen [facility] 2/9 01/13/2020 12/09/2019 10/29/2019 06/06/2019 01/30/2019  Decreased Interest 0 0 3 0 0  Down, Depressed, Hopeless 0 0 3 0 0  PHQ - 2 Score 0 0 6 0 0  Altered sleeping 3 1 3 3  0  Tired, decreased energy 0 0 2 1 0  Change in appetite 0 0 0 0 0  Feeling bad or failure about yourself  0 0 1 0 0  Trouble concentrating 0 0 0 0 0  Moving slowly or fidgety/restless 0 0 2 0 0  Suicidal thoughts 0 0 0 0 0  PHQ-9 Score 3 1 14 4  0  Difficult doing  work/chores - - - Not difficult at all Not difficult at all  Some recent data might be hidden    Hypertension:  BP Readings from Last 3 Encounters:  01/13/20 116/66  10/29/19 110/70  09/16/19 113/75    Obesity: Wt Readings from Last 3 Encounters:  01/13/20 251 lb 3.2 oz (113.9 kg)  10/29/19 254 lb 11.2 oz (115.5 kg)  09/16/19 265 lb (120.2 kg)   BMI Readings from Last 3 Encounters:  01/13/20 33.14 kg/m  10/29/19 33.60 kg/m  09/16/19 34.02 kg/m     Lipids:  Lab Results  Component Value Date   CHOL 196 12/05/2018   CHOL 197 11/27/2017   CHOL 160 09/15/2016   Lab Results  Component Value Date   HDL 32 (L) 12/05/2018   HDL 37 (L) 11/27/2017   HDL 37 (L) 09/15/2016   Lab Results  Component Value Date   LDLCALC 113 (H) 12/05/2018   LDLCALC 128 (H) 11/27/2017   LDLCALC 97 09/15/2016   Lab Results  Component Value Date   TRIG 360 (H)  12/05/2018   TRIG 182 (H) 11/27/2017   TRIG 132 09/15/2016   Lab Results  Component Value Date   CHOLHDL 6.1 (H) 12/05/2018   CHOLHDL 5.3 (H) 11/27/2017   CHOLHDL 4.3 09/15/2016   No results found for: LDLDIRECT Glucose:  Glucose  Date Value Ref Range Status  11/18/2012 106 (H) 65 - 99 mg/dL Final  22/97/9892 93 65 - 99 mg/dL Final  11/94/1740 814 (H) 65 - 99 mg/dL Final   Glucose, Bld  Date Value Ref Range Status  12/05/2018 103 (H) 65 - 99 mg/dL Final    Comment:    .            Fasting reference interval . For someone without known diabetes, a glucose value between 100 and 125 mg/dL is consistent with prediabetes and should be confirmed with a follow-up test. .   10/28/2018 113 (H) 70 - 99 mg/dL Final  48/18/5631 90 65 - 99 mg/dL Final    Comment:    .            Fasting reference interval .     Flowsheet Row Office Visit from 10/29/2019 in [facility]  AUDIT-C Score 0       Married STD testing and prevention (HIV/chl/gon/syphilis): not interested  Hep C: 12/05/2018  Skin  cancer: Discussed monitoring for atypical lesions Colorectal cancer: N/A Prostate cancer: No family history of prostate cancer   Lung cancer:  Low Dose CT Chest recommended if Age 48-80 years, 30 pack-year currently smoking OR have quit w/in 15years. Patient does not qualify.   ECG:  04/16/2015  Vaccines:   HPV: discussed but he is not interested  Shingrix: 80-64 yo and ask insurance if covered when patient above 24 yo Pneumonia: N/A educated and discussed with patient. Flu: 10/29/2019 educated and discussed with patient.  Advanced Care Planning: A voluntary discussion about advance care planning including the explanation and discussion of advance directives.  Discussed health care proxy and Living will, and the patient was able to identify a health care proxy as wife .    Patient Active Problem List   Diagnosis Date Noted  . Ankle instability 12/03/2017  . Sprain of ankle 11/19/2017  . Metabolic syndrome 12/13/2016  . Moderate major depression (HCC) 11/30/2016  . History of vasculitis of skin 05/20/2015  . GERD without esophagitis 11/10/2014  . Seasonal allergic rhinitis 11/10/2014  . History of nephrolithiasis 11/10/2014  . Obesity (BMI 30-39.9) 11/10/2014  . Tobacco abuse 11/10/2014  . Snoring 11/10/2014  . Insomnia 11/10/2014    Past Surgical History:  Procedure Laterality Date  . FACIAL RECONSTRUCTION SURGERY  01/13/2008   After a car fell on him while working on it    Family History  Problem Relation Age of Onset  . Varicose Veins Mother   . Endometriosis Mother   . Drug abuse Father   . GI Disease Father   . Heart attack Maternal Grandmother   . Breast cancer Maternal Grandmother   . Thyroid cancer Maternal Grandmother   . Congestive Heart Failure Maternal Grandfather   . Heart attack Maternal Grandfather   . Stroke Maternal Grandfather   . Breast cancer Paternal Grandmother   . Heart attack Paternal Grandfather     Social History   Socioeconomic  History  . Marital status: Married    Spouse name: Cala Bradford   . Number of children: 1  . Years of education: Not on file  . Highest education level: Some college, no  degree  Occupational History  . Not on file  Tobacco Use  . Smoking status: Former Smoker    Packs/day: 0.25    Years: 15.00    Pack years: 3.75    Types: Cigarettes, E-cigarettes    Start date: 11/10/1999    Quit date: 06/05/2017    Years since quitting: 2.6  . Smokeless tobacco: Never Used  . Tobacco comment: e cig mostly  Vaping Use  . Vaping Use: Every day  . Start date: 11/28/2011  . Substances: Nicotine, Flavoring  . Devices: Box Mod  Substance and Sexual Activity  . Alcohol use: No    Alcohol/week: 0.0 standard drinks  . Drug use: No  . Sexual activity: Yes    Partners: Female  Other Topics Concern  . Not on file  Social History Narrative   He is married has one son.    He was addicted to video games, but is back playing guitar    Social Determinants of Health   Financial Resource Strain: Low Risk   . Difficulty of Paying Living Expenses: Not hard at all  Food Insecurity: No Food Insecurity  . Worried About Programme researcher, broadcasting/film/videounning Out of Food in the Last Year: Never true  . Ran Out of Food in the Last Year: Never true  Transportation Needs: No Transportation Needs  . Lack of Transportation (Medical): No  . Lack of Transportation (Non-Medical): No  Physical Activity: Not on file  Stress: Stress Concern Present  . Feeling of Stress : Very much  Social Connections: Moderately Integrated  . Frequency of Communication with Friends and Family: More than three times a week  . Frequency of Social Gatherings with Friends and Family: Once a week  . Attends Religious Services: Never  . Active Member of Clubs or Organizations: Yes  . Attends BankerClub or Organization Meetings: More than 4 times per year  . Marital Status: Married  Catering managerntimate Partner Violence: Not At Risk  . Fear of Current or Ex-Partner: No  . Emotionally  Abused: No  . Physically Abused: No  . Sexually Abused: No     Current Outpatient Medications:  .  amphetamine-dextroamphetamine (ADDERALL XR) 10 MG 24 hr capsule, Take 1 capsule (10 mg total) by mouth daily., Disp: 30 capsule, Rfl: 0 .  amphetamine-dextroamphetamine (ADDERALL XR) 10 MG 24 hr capsule, Take 1 capsule (10 mg total) by mouth daily., Disp: 30 capsule, Rfl: 0 .  EPINEPHrine 0.3 mg/0.3 mL IJ SOAJ injection, Inject 0.3 mLs (0.3 mg total) into the muscle as needed for anaphylaxis., Disp: 2 each, Rfl: 0 .  escitalopram (LEXAPRO) 20 MG tablet, TAKE 1 TABLET(20 MG) BY MOUTH DAILY, Disp: 90 tablet, Rfl: 0 .  QUEtiapine (SEROQUEL) 25 MG tablet, Take 1 tablet (25 mg total) by mouth at bedtime., Disp: 90 tablet, Rfl: 1 .  benzonatate (TESSALON) 100 MG capsule, Take 1-2 capsules (100-200 mg total) by mouth 2 (two) times daily as needed. (Patient not taking: Reported on 01/13/2020), Disp: 40 capsule, Rfl: 0  Allergies  Allergen Reactions  . Bee Venom Anaphylaxis, Shortness Of Breath and Swelling  . Penicillins Hives    vasculitis     ROS  Constitutional: Negative for fever or weight change.  Respiratory: Negative for cough and shortness of breath.   Cardiovascular: Negative for chest pain or palpitations.  Gastrointestinal: Negative for abdominal pain, no bowel changes.  Musculoskeletal: Negative for gait problem or joint swelling.  Skin: Negative for rash.  Neurological: Negative for dizziness or headache.  No other  specific complaints in a complete review of systems (except as listed in HPI above).   Objective  Vitals:   01/13/20 1311  BP: 116/66  Pulse: 94  Resp: 16  Temp: 98 F (36.7 C)  TempSrc: Oral  SpO2: 98%  Weight: 251 lb 3.2 oz (113.9 kg)  Height: 6\' 1"  (1.854 m)    Body mass index is 33.14 kg/m.  Physical Exam  Constitutional: Patient appears well-developed and well-nourished. No distress.  HENT: Head: Normocephalic and atraumatic. Ears: B TMs ok, no  erythema or effusion; Nose: Not done  Mouth/Throat: not done  Eyes: Conjunctivae and EOM are normal. Pupils are equal, round, and reactive to light. No scleral icterus.  Neck: Normal range of motion. Neck supple. No JVD present. No thyromegaly present.  Cardiovascular: Normal rate, regular rhythm and normal heart sounds.  No murmur heard. No BLE edema. Pulmonary/Chest: Effort normal and breath sounds normal. No respiratory distress. Abdominal: Soft. Bowel sounds are normal, no distension. There is no tenderness. no masses MALE GENITALIA: Normal descended testes bilaterally, no masses palpated, no hernias, no lesions, no discharge RECTAL: not done  Musculoskeletal: Normal range of motion, no joint effusions. No gross deformities Neurological: he is alert and oriented to person, place, and time. No cranial nerve deficit. Coordination, balance, strength, speech and gait are normal.  Skin: Skin is warm and dry. No rash noted. No erythema.  Psychiatric: Patient has a normal mood and affect. behavior is normal. Judgment and thought content normal.  Fall Risk: Fall Risk  01/13/2020 12/09/2019 10/29/2019 06/06/2019 01/30/2019  Falls in the past year? 1 0 0 1 0  Number falls in past yr: 0 0 - 0 0  Injury with Fall? 0 0 - 1 0  Comment - - - - -  Follow up - - - - Falls evaluation completed    Functional Status Survey: Is the patient deaf or have difficulty hearing?: No Does the patient have difficulty seeing, even when wearing glasses/contacts?: No Does the patient have difficulty concentrating, remembering, or making decisions?: Yes Does the patient have difficulty walking or climbing stairs?: No Does the patient have difficulty dressing or bathing?: No Does the patient have difficulty doing errands alone such as visiting a doctor's office or shopping?: No    Assessment & Plan  1. Well adult health check  - CBC with Differential/Platelet - COMPLETE METABOLIC PANEL WITH GFR - Lipid panel -  Hemoglobin A1c  2. Dyslipidemia  - Lipid panel  3. Acute pain of right knee  Try otc medication and ice, return if no improvement   4. Metabolic syndrome  A1C today   5. Encounter for screening for HIV  - HIV Antibody (routine testing w rflx)   -Prostate cancer screening and PSA options (with potential risks and benefits of testing vs not testing) were discussed along with recent recs/guidelines. -USPSTF grade A and B recommendations reviewed with patient; age-appropriate recommendations, preventive care, screening tests, etc discussed and encouraged; healthy living encouraged; see AVS for patient education given to patient -Discussed importance of 150 minutes of physical activity weekly, eat two servings of fish weekly, eat one serving of tree nuts ( cashews, pistachios, pecans, almonds.03/30/2019) every other day, eat 6 servings of fruit/vegetables daily and drink plenty of water and avoid sweet beverages.

## 2020-01-13 ENCOUNTER — Encounter: Payer: Self-pay | Admitting: Family Medicine

## 2020-01-13 ENCOUNTER — Ambulatory Visit (INDEPENDENT_AMBULATORY_CARE_PROVIDER_SITE_OTHER): Payer: 59 | Admitting: Family Medicine

## 2020-01-13 ENCOUNTER — Other Ambulatory Visit: Payer: Self-pay

## 2020-01-13 VITALS — BP 116/66 | HR 94 | Temp 98.0°F | Resp 16 | Ht 73.0 in | Wt 251.2 lb

## 2020-01-13 DIAGNOSIS — E8881 Metabolic syndrome: Secondary | ICD-10-CM | POA: Diagnosis not present

## 2020-01-13 DIAGNOSIS — M25561 Pain in right knee: Secondary | ICD-10-CM | POA: Diagnosis not present

## 2020-01-13 DIAGNOSIS — Z0000 Encounter for general adult medical examination without abnormal findings: Secondary | ICD-10-CM

## 2020-01-13 DIAGNOSIS — R69 Illness, unspecified: Secondary | ICD-10-CM | POA: Diagnosis not present

## 2020-01-13 DIAGNOSIS — E785 Hyperlipidemia, unspecified: Secondary | ICD-10-CM

## 2020-01-13 DIAGNOSIS — Z114 Encounter for screening for human immunodeficiency virus [HIV]: Secondary | ICD-10-CM

## 2020-01-13 NOTE — Patient Instructions (Signed)
Preventive Care 19-35 Years Old, Male Preventive care refers to lifestyle choices and visits with your health care provider that can promote health and wellness. This includes:  A yearly physical exam. This is also called an annual well check.  Regular dental and eye exams.  Immunizations.  Screening for certain conditions.  Healthy lifestyle choices, such as eating a healthy diet, getting regular exercise, not using drugs or products that contain nicotine and tobacco, and limiting alcohol use. What can I expect for my preventive care visit? Physical exam Your health care provider will check:  Height and weight. These may be used to calculate body mass index (BMI), which is a measurement that tells if you are at a healthy weight.  Heart rate and blood pressure.  Your skin for abnormal spots. Counseling Your health care provider may ask you questions about:  Alcohol, tobacco, and drug use.  Emotional well-being.  Home and relationship well-being.  Sexual activity.  Eating habits.  Work and work Statistician. What immunizations do I need?  Influenza (flu) vaccine  This is recommended every year. Tetanus, diphtheria, and pertussis (Tdap) vaccine  You may need a Td booster every 10 years. Varicella (chickenpox) vaccine  You may need this vaccine if you have not already been vaccinated. Human papillomavirus (HPV) vaccine  If recommended by your health care provider, you may need three doses over 6 months. Measles, mumps, and rubella (MMR) vaccine  You may need at least one dose of MMR. You may also need a second dose. Meningococcal conjugate (MenACWY) vaccine  One dose is recommended if you are 45-76 years old and a Market researcher living in a residence hall, or if you have one of several medical conditions. You may also need additional booster doses. Pneumococcal conjugate (PCV13) vaccine  You may need this if you have certain conditions and were not  previously vaccinated. Pneumococcal polysaccharide (PPSV23) vaccine  You may need one or two doses if you smoke cigarettes or if you have certain conditions. Hepatitis A vaccine  You may need this if you have certain conditions or if you travel or work in places where you may be exposed to hepatitis A. Hepatitis B vaccine  You may need this if you have certain conditions or if you travel or work in places where you may be exposed to hepatitis B. Haemophilus influenzae type b (Hib) vaccine  You may need this if you have certain risk factors. You may receive vaccines as individual doses or as more than one vaccine together in one shot (combination vaccines). Talk with your health care provider about the risks and benefits of combination vaccines. What tests do I need? Blood tests  Lipid and cholesterol levels. These may be checked every 5 years starting at age 17.  Hepatitis C test.  Hepatitis B test. Screening   Diabetes screening. This is done by checking your blood sugar (glucose) after you have not eaten for a while (fasting).  Sexually transmitted disease (STD) testing. Talk with your health care provider about your test results, treatment options, and if necessary, the need for more tests. Follow these instructions at home: Eating and drinking   Eat a diet that includes fresh fruits and vegetables, whole grains, lean protein, and low-fat dairy products.  Take vitamin and mineral supplements as recommended by your health care provider.  Do not drink alcohol if your health care provider tells you not to drink.  If you drink alcohol: ? Limit how much you have to 0-2  drinks a day. ? Be aware of how much alcohol is in your drink. In the [place] one drink equals one 12 oz bottle of beer (355 mL), one 5 oz glass of wine (148 mL), or one 1 oz glass of hard liquor (44 mL). Lifestyle  Take daily care of your teeth and gums.  Stay active. Exercise for at least 30 minutes on 5 or  more days each week.  Do not use any products that contain nicotine or tobacco, such as cigarettes, e-cigarettes, and chewing tobacco. If you need help quitting, ask your health care provider.  If you are sexually active, practice safe sex. Use a condom or other form of protection to prevent STIs (sexually transmitted infections). What's next?  Go to your health care provider once a year for a well check visit.  Ask your health care provider how often you should have your eyes and teeth checked.  Stay up to date on all vaccines. This information is not intended to replace advice given to you by your health care provider. Make sure you discuss any questions you have with your health care provider. Document Revised: 01/03/2018 Document Reviewed: 01/03/2018 Elsevier Patient Education  2020 [employer].

## 2020-01-14 DIAGNOSIS — Z0000 Encounter for general adult medical examination without abnormal findings: Secondary | ICD-10-CM | POA: Diagnosis not present

## 2020-01-14 DIAGNOSIS — Z114 Encounter for screening for human immunodeficiency virus [HIV]: Secondary | ICD-10-CM | POA: Diagnosis not present

## 2020-01-14 DIAGNOSIS — R69 Illness, unspecified: Secondary | ICD-10-CM | POA: Diagnosis not present

## 2020-01-14 DIAGNOSIS — E785 Hyperlipidemia, unspecified: Secondary | ICD-10-CM | POA: Diagnosis not present

## 2020-01-15 LAB — COMPLETE METABOLIC PANEL WITH GFR
AG Ratio: 1.5 (calc) (ref 1.0–2.5)
ALT: 18 U/L (ref 9–46)
AST: 14 U/L (ref 10–40)
Albumin: 4.7 g/dL (ref 3.6–5.1)
Alkaline phosphatase (APISO): 67 U/L (ref 36–130)
BUN: 14 mg/dL (ref 7–25)
CO2: 29 mmol/L (ref 20–32)
Calcium: 10 mg/dL (ref 8.6–10.3)
Chloride: 102 mmol/L (ref 98–110)
Creat: 1.1 mg/dL (ref 0.60–1.35)
GFR, Est African American: 100 mL/min/{1.73_m2} (ref >=60)
GFR, Est Non African American: 87 mL/min/{1.73_m2} (ref >=60)
Globulin: 3.1 g/dL (calc) (ref 1.9–3.7)
Glucose, Bld: 105 mg/dL — ABNORMAL HIGH (ref 65–99)
Potassium: 4.6 mmol/L (ref 3.5–5.3)
Sodium: 139 mmol/L (ref 135–146)
Total Bilirubin: 0.5 mg/dL (ref 0.2–1.2)
Total Protein: 7.8 g/dL (ref 6.1–8.1)

## 2020-01-15 LAB — CBC WITH DIFFERENTIAL/PLATELET
Absolute Monocytes: 826 cells/uL (ref 200–950)
Basophils Absolute: 52 cells/uL (ref 0–200)
Basophils Relative: 0.6 %
Eosinophils Absolute: 215 cells/uL (ref 15–500)
Eosinophils Relative: 2.5 %
HCT: 46.6 % (ref 38.5–50.0)
Hemoglobin: 15.8 g/dL (ref 13.2–17.1)
Lymphs Abs: 2468 cells/uL (ref 850–3900)
MCH: 30 pg (ref 27.0–33.0)
MCHC: 33.9 g/dL (ref 32.0–36.0)
MCV: 88.6 fL (ref 80.0–100.0)
MPV: 9.7 fL (ref 7.5–12.5)
Monocytes Relative: 9.6 %
Neutro Abs: 5040 cells/uL (ref 1500–7800)
Neutrophils Relative %: 58.6 %
Platelets: 367 10*3/uL (ref 140–400)
RBC: 5.26 10*6/uL (ref 4.20–5.80)
RDW: 12.6 % (ref 11.0–15.0)
Total Lymphocyte: 28.7 %
WBC: 8.6 10*3/uL (ref 3.8–10.8)

## 2020-01-15 LAB — LIPID PANEL
Cholesterol: 215 mg/dL — ABNORMAL HIGH (ref <200)
HDL: 40 mg/dL (ref >=40)
LDL Cholesterol (Calc): 131 mg/dL (calc) — ABNORMAL HIGH
Non-HDL Cholesterol (Calc): 175 mg/dL (calc) — ABNORMAL HIGH (ref <130)
Total CHOL/HDL Ratio: 5.4 (calc) — ABNORMAL HIGH (ref <5.0)
Triglycerides: 310 mg/dL — ABNORMAL HIGH (ref <150)

## 2020-01-15 LAB — HEMOGLOBIN A1C
Hgb A1c MFr Bld: 5.7 % of total Hgb — ABNORMAL HIGH (ref <5.7)
Mean Plasma Glucose: 117 mg/dL
eAG (mmol/L): 6.5 mmol/L

## 2020-01-15 LAB — HIV ANTIBODY (ROUTINE TESTING W REFLEX): HIV 1&2 Ab, 4th Generation: NONREACTIVE

## 2020-02-04 ENCOUNTER — Telehealth: Payer: Self-pay | Admitting: Family Medicine

## 2020-02-04 DIAGNOSIS — G4709 Other insomnia: Secondary | ICD-10-CM

## 2020-02-05 NOTE — Telephone Encounter (Signed)
Pt called and stated that he received a message from the pharmacy that his medication refill was denied /Pt stated he didn't request it but it is on auto refill/ Pts last refill shows May of 2021 for two 90 days supplies / trying to figure out if a new refill was sent to pharmacy since/ please advise pt of refill and next time to request refill

## 2020-02-10 ENCOUNTER — Other Ambulatory Visit: Payer: Self-pay | Admitting: Family Medicine

## 2020-02-10 DIAGNOSIS — G4709 Other insomnia: Secondary | ICD-10-CM

## 2020-02-10 DIAGNOSIS — F330 Major depressive disorder, recurrent, mild: Secondary | ICD-10-CM

## 2020-02-10 MED ORDER — QUETIAPINE FUMARATE 25 MG PO TABS: 25.0000 mg | ORAL_TABLET | Freq: Every day | ORAL | 1 refills | Status: DC

## 2020-02-10 MED ORDER — AMPHETAMINE-DEXTROAMPHET ER 10 MG PO CP24: 10.0000 mg | ORAL_CAPSULE | Freq: Every day | ORAL | 0 refills | Status: DC

## 2020-02-10 NOTE — Telephone Encounter (Signed)
Requested medication (s) are due for refill today: yes  Requested medication (s) are on the active medication list: yes  Last refill:  12/09/19 #30  Future visit scheduled: yes  Notes to clinic:  Please review for refill. Refill not delegated per protocol    Requested Prescriptions  Pending Prescriptions Disp Refills   amphetamine-dextroamphetamine (ADDERALL XR) 10 MG 24 hr capsule 30 capsule 0    Sig: Take 1 capsule (10 mg total) by mouth daily.      Not Delegated - Psychiatry:  Stimulants/ADHD Failed - 02/10/2020 12:30 PM      Failed - This refill cannot be delegated      Failed - Urine Drug Screen completed in last 360 days      Passed - Valid encounter within last 3 months    Recent Outpatient Visits           4 weeks ago Well adult health check   [facility] Alba Cory, MD   2 months ago Upper respiratory tract infection, unspecified type   [facility] [place], Danna Hefty, MD   3 months ago Moderate episode of recurrent major depressive disorder [facility])   [facility] Alba Cory, MD   8 months ago Dyslipidemia   [facility] Alba Cory, MD   1 year ago Left otitis media, unspecified otitis media type   [facility] Doren Custard, [place]       Future Appointments             In 1 month Alba Cory, MD [facility]

## 2020-02-10 NOTE — Telephone Encounter (Signed)
Medication Refill - Medication: generic adderall xr 10 mg  Has the patient contacted their pharmacy? Yes . Pharm told pt to contact his doctor office. Pt does have an appt with dr Carlynn Purl on 03-16-2020 , Preferred Pharmacy (with phone number or street name): [facility] [address] in [place] phone number [phone]  Agent: Please be advised that RX refills may take up to 3 business days. We ask that you follow-up with your pharmacy.

## 2020-02-11 ENCOUNTER — Other Ambulatory Visit: Payer: Self-pay

## 2020-02-11 ENCOUNTER — Encounter: Payer: Self-pay | Admitting: Emergency Medicine

## 2020-02-11 ENCOUNTER — Ambulatory Visit
Admission: EM | Admit: 2020-02-11 | Discharge: 2020-02-11 | Disposition: A | Discharge status: Home / Self Care | Payer: 59 | Attending: Sports Medicine | Admitting: Sports Medicine

## 2020-02-11 DIAGNOSIS — J069 Acute upper respiratory infection, unspecified: Secondary | ICD-10-CM | POA: Diagnosis not present

## 2020-02-11 DIAGNOSIS — R058 Other specified cough: Secondary | ICD-10-CM | POA: Diagnosis not present

## 2020-02-11 DIAGNOSIS — R059 Cough, unspecified: Secondary | ICD-10-CM | POA: Diagnosis not present

## 2020-02-11 DIAGNOSIS — Z20822 Contact with and (suspected) exposure to covid-19: Secondary | ICD-10-CM | POA: Insufficient documentation

## 2020-02-11 NOTE — ED Triage Notes (Signed)
Pt was tested for Covid on 02/10/20 for work (weekly) and was negative. He reports then being exposed at work and now c/o of cough, sorethroat and nasal congestion. Was sent home by employer.

## 2020-02-11 NOTE — Discharge Instructions (Addendum)
Recommended going ahead and getting a COVID test.  I will take between 6 to 24 hours to return from the hospital.  In the interim I have asked him to isolate.  If his test is negative he can return to work tomorrow with a mask.  If his test is positive then someone will contact him with an updated work note and quarantine per [employer] guidelines. Educational handout was provided. Plenty of rest, plenty of fluids.  Tylenol or Motrin for fever discomfort.  Supportive care with over-the-counter meds including Robitussin or Delsym for cough and Mucinex for congestion. Provided a work note. Follow-up here as needed.

## 2020-02-11 NOTE — ED Provider Notes (Signed)
[facility]    CSN: [id number] Arrival date & time: 02/11/20  1119      History   Chief Complaint Chief Complaint  Patient presents with  . Cough    HPI Timothy Fleming is a 35 y.o. male.   Pleasant 35 year old male who presents for evaluation of the above issue.  He was exposed to somebody who was COVID-positive yesterday and he started having symptoms yesterday.  These include cough congestion and a mild sore throat from the cough.  There has been a little bit of posttussive emesis.  No nausea or diarrhea.  He is also had associated headaches.  No fever shakes chills.  He has not been vaccinated against COVID.  He has had the flu shot.  He has never had COVID.  No significant shortness of breath or chest pain.  No red flag signs or symptoms.  He works for the city of [place] and is in need of a work note.  He cannot come back until he has a documented negative test.  No red flag signs or symptoms.     Past Medical History:  Diagnosis Date  . Allergy    Seasonal  . GERD (gastroesophageal reflux disease)   . History of kidney stones    x4  . Insomnia     Patient Active Problem List   Diagnosis Date Noted  . Ankle instability 12/03/2017  . Sprain of ankle 11/19/2017  . Metabolic syndrome 12/13/2016  . Moderate major depression (HCC) 11/30/2016  . History of vasculitis of skin 05/20/2015  . GERD without esophagitis 11/10/2014  . Seasonal allergic rhinitis 11/10/2014  . History of nephrolithiasis 11/10/2014  . Obesity (BMI 30-39.9) 11/10/2014  . Tobacco abuse 11/10/2014  . Snoring 11/10/2014  . Insomnia 11/10/2014    Past Surgical History:  Procedure Laterality Date  . FACIAL RECONSTRUCTION SURGERY  01/13/2008   After a car fell on him while working on it       Home Medications    Prior to Admission medications   Medication Sig Start Date End Date Taking? Authorizing Provider  amphetamine-dextroamphetamine (ADDERALL XR) 10 MG 24 hr capsule Take  1 capsule (10 mg total) by mouth daily. 12/09/19  Yes Sowles, Danna Hefty, MD  escitalopram (LEXAPRO) 20 MG tablet TAKE 1 TABLET(20 MG) BY MOUTH DAILY 12/06/19  Yes Sowles, Danna Hefty, MD  QUEtiapine (SEROQUEL) 25 MG tablet Take 1 tablet (25 mg total) by mouth at bedtime. 02/10/20  Yes Sowles, Danna Hefty, MD  amphetamine-dextroamphetamine (ADDERALL XR) 10 MG 24 hr capsule Take 1 capsule (10 mg total) by mouth daily. 02/10/20   Alba Cory, MD  EPINEPHrine 0.3 mg/0.3 mL IJ SOAJ injection Inject 0.3 mLs (0.3 mg total) into the muscle as needed for anaphylaxis. 06/30/19   Alba Cory, MD  fluticasone (FLONASE) 50 MCG/ACT nasal spray Place 2 sprays into both nostrils daily. 01/18/19 04/01/19  Rodriguez-Southworth, Nettie Elm, PA-C    Family History Family History  Problem Relation Age of Onset  . Varicose Veins Mother   . Endometriosis Mother   . Drug abuse Father   . GI Disease Father   . Heart attack Maternal Grandmother   . Breast cancer Maternal Grandmother   . Thyroid cancer Maternal Grandmother   . Congestive Heart Failure Maternal Grandfather   . Heart attack Maternal Grandfather   . Stroke Maternal Grandfather   . Breast cancer Paternal Grandmother   . Heart attack Paternal Grandfather     Social History Social History   Tobacco Use  .  Smoking status: Former Smoker    Packs/day: 0.25    Years: 15.00    Pack years: 3.75    Types: Cigarettes, E-cigarettes    Start date: 11/10/1999    Quit date: 06/05/2017    Years since quitting: 2.6  . Smokeless tobacco: Never Used  . Tobacco comment: e cig mostly  Vaping Use  . Vaping Use: Every day  . Start date: 11/28/2011  . Substances: Nicotine, Flavoring  . Devices: Box Mod  Substance Use Topics  . Alcohol use: No    Alcohol/week: 0.0 standard drinks  . Drug use: No     Allergies   Bee venom and Penicillins   Review of Systems Review of Systems  Constitutional: Negative for chills, diaphoresis, fatigue and fever.  HENT: Positive  for congestion and sore throat. Negative for ear discharge, ear pain, rhinorrhea, sinus pressure and sinus pain.   Respiratory: Positive for cough.   Cardiovascular: Negative for chest pain and palpitations.  Gastrointestinal: Negative for abdominal pain.  Genitourinary: Negative for dysuria, flank pain, frequency and hematuria.  Musculoskeletal: Negative for myalgias.  Neurological: Positive for headaches. Negative for dizziness, tremors, syncope, weakness, light-headedness and numbness.  All other systems reviewed and are negative.    Physical Exam Triage Vital Signs ED Triage Vitals  Enc Vitals Group     BP 02/11/20 1220 (!) 155/75     Pulse Rate 02/11/20 1220 98     Resp 02/11/20 1220 18     Temp 02/11/20 1220 98.1 F (36.7 C)     Temp Source 02/11/20 1220 Oral     SpO2 02/11/20 1220 98 %     Weight --      Height --      Head Circumference --      Peak Flow --      Pain Score 02/11/20 1216 0     Pain Loc --      Pain Edu? --      Excl. in GC? --    No data found.  Updated Vital Signs BP (!) 155/75 (BP Location: Left Arm)   Pulse 98   Temp 98.1 F (36.7 C) (Oral)   Resp 18   SpO2 98%   Visual Acuity Right Eye Distance:   Left Eye Distance:   Bilateral Distance:    Right Eye Near:   Left Eye Near:    Bilateral Near:     Physical Exam Vitals and nursing note reviewed.  Constitutional:      Appearance: Normal appearance.  HENT:     Head: Normocephalic and atraumatic.     Right Ear: Tympanic membrane normal.     Left Ear: Tympanic membrane normal.     Nose: Congestion present. No rhinorrhea.     Mouth/Throat:     Mouth: Mucous membranes are dry.     Pharynx: Posterior oropharyngeal erythema present. No oropharyngeal exudate.  Eyes:     Extraocular Movements: Extraocular movements intact.     Conjunctiva/sclera: Conjunctivae normal.     Pupils: Pupils are equal, round, and reactive to light.  Cardiovascular:     Rate and Rhythm: Normal rate and  regular rhythm.     Pulses: Normal pulses.     Heart sounds: Normal heart sounds. No murmur heard. No friction rub. No gallop.   Pulmonary:     Effort: Pulmonary effort is normal. No respiratory distress.     Breath sounds: Normal breath sounds. No stridor. No wheezing, rhonchi or rales.  Musculoskeletal:  Cervical back: Normal range of motion. No rigidity or tenderness.  Lymphadenopathy:     Cervical: Cervical adenopathy present.  Skin:    General: Skin is warm and dry.     Capillary Refill: Capillary refill takes less than 2 seconds.  Neurological:     General: No focal deficit present.     Mental Status: He is alert and oriented to person, place, and time.      UC Treatments / Results  Labs (all labs ordered are listed, but only abnormal results are displayed) Labs Reviewed  SARS CORONAVIRUS 2 (TAT 6-24 HRS)    EKG   Radiology No results found.  Procedures Procedures (including critical care time)  Medications Ordered in UC Medications - No data to display  Initial Impression / Assessment and Plan / UC Course  I have reviewed the triage vital signs and the nursing notes.  Pertinent labs & imaging results that were available during my care of the patient were reviewed by me and considered in my medical decision making (see chart for details).   Clinical impression: 2 days of cough congestion sore throat with headache.  Patient has had COVID exposure.  He has not been vaccinated against COVID.  Treatment plan: 1.  The findings and treatment plan were discussed in detail with the patient.  Patient was in agreement. 2.  Recommended going ahead and getting a COVID test.  I will take between 6 to 24 hours to return from the hospital.  In the interim I have asked him to isolate.  If his test is negative he can return to work tomorrow with a mask.  If his test is positive then someone will contact him with an updated work note and quarantine per [employer] guidelines. 3.   Educational handout was provided. 4.  Plenty of rest, plenty of fluids.  Tylenol or Motrin for fever discomfort.  Supportive care with over-the-counter meds including Robitussin or Delsym for cough and Mucinex for congestion. 5.  Provided a work note. 6.  Follow-up here as needed.    Final Clinical Impressions(s) / UC Diagnoses   Final diagnoses:  Cough  Acute upper respiratory infection  Cough with exposure to COVID-19 virus     Discharge Instructions     Recommended going ahead and getting a COVID test.  I will take between 6 to 24 hours to return from the hospital.  In the interim I have asked him to isolate.  If his test is negative he can return to work tomorrow with a mask.  If his test is positive then someone will contact him with an updated work note and quarantine per [employer] guidelines. Educational handout was provided. Plenty of rest, plenty of fluids.  Tylenol or Motrin for fever discomfort.  Supportive care with over-the-counter meds including Robitussin or Delsym for cough and Mucinex for congestion. Provided a work note. Follow-up here as needed.    ED Prescriptions    None     PDMP not reviewed this encounter.   Delton See, MD 02/11/20 1315

## 2020-02-12 LAB — SARS CORONAVIRUS 2 (TAT 6-24 HRS): SARS Coronavirus 2: NEGATIVE

## 2020-02-15 ENCOUNTER — Encounter: Payer: Self-pay | Admitting: Emergency Medicine

## 2020-02-15 ENCOUNTER — Ambulatory Visit
Admission: EM | Admit: 2020-02-15 | Discharge: 2020-02-15 | Disposition: A | Discharge status: Home / Self Care | Payer: 59 | Attending: Physician Assistant | Admitting: Physician Assistant

## 2020-02-15 ENCOUNTER — Other Ambulatory Visit: Payer: Self-pay

## 2020-02-15 DIAGNOSIS — R52 Pain, unspecified: Secondary | ICD-10-CM | POA: Diagnosis not present

## 2020-02-15 DIAGNOSIS — B349 Viral infection, unspecified: Secondary | ICD-10-CM | POA: Diagnosis not present

## 2020-02-15 DIAGNOSIS — Z20822 Contact with and (suspected) exposure to covid-19: Secondary | ICD-10-CM

## 2020-02-15 DIAGNOSIS — U071 COVID-19: Secondary | ICD-10-CM | POA: Diagnosis not present

## 2020-02-15 DIAGNOSIS — R059 Cough, unspecified: Secondary | ICD-10-CM | POA: Insufficient documentation

## 2020-02-15 LAB — SARS CORONAVIRUS 2 (TAT 6-24 HRS): SARS Coronavirus 2: POSITIVE — AB

## 2020-02-15 MED ORDER — IBUPROFEN 800 MG PO TABS: 800.0000 mg | ORAL_TABLET | Freq: Three times a day (TID) | ORAL | 0 refills | Status: AC | PRN

## 2020-02-15 MED ORDER — GUAIFENESIN-CODEINE 100-10 MG/5ML PO SYRP: 10.0000 mL | ORAL_SOLUTION | Freq: Four times a day (QID) | ORAL | 0 refills | Status: AC | PRN

## 2020-02-15 NOTE — ED Provider Notes (Signed)
[facility]    CSN: [id number] Arrival date & time: 02/15/20  1123      History   Chief Complaint Chief Complaint  Patient presents with  . Cough  . Nasal Congestion    HPI Timothy Fleming is a 35 y.o. male presenting for self reported 5 day history of cough and congestion. Also admits to fatigue, body aches, headaches, sore throat and shortness of breath on coughing. Patient was seen seen in the clinic 4 days ago and had a negative COVID test. Patient not vaccinated for COVID 19. He says he has been exposed to COVID 19 through multiple co-workers. He says he has been taking OTC cough syrups w/o improvement in symptoms and has been feeling progressively worse. Patient believes he may have tested for COVID too soon and requests repeat testing today.Patient denies fever, weakness, chest pain, abdominal pain, n/v/d. He denies any history of cardiopulmonary disease. No other concerns.  HPI  Past Medical History:  Diagnosis Date  . Allergy    Seasonal  . GERD (gastroesophageal reflux disease)   . History of kidney stones    x4  . Insomnia     Patient Active Problem List   Diagnosis Date Noted  . Ankle instability 12/03/2017  . Sprain of ankle 11/19/2017  . Metabolic syndrome 12/13/2016  . Moderate major depression (HCC) 11/30/2016  . History of vasculitis of skin 05/20/2015  . GERD without esophagitis 11/10/2014  . Seasonal allergic rhinitis 11/10/2014  . History of nephrolithiasis 11/10/2014  . Obesity (BMI 30-39.9) 11/10/2014  . Tobacco abuse 11/10/2014  . Snoring 11/10/2014  . Insomnia 11/10/2014    Past Surgical History:  Procedure Laterality Date  . FACIAL RECONSTRUCTION SURGERY  01/13/2008   After a car fell on him while working on it       Home Medications    Prior to Admission medications   Medication Sig Start Date End Date Taking? Authorizing Provider  amphetamine-dextroamphetamine (ADDERALL XR) 10 MG 24 hr capsule Take 1 capsule (10 mg  total) by mouth daily. 12/09/19  Yes Sowles, Danna HeftyKrichna, MD  escitalopram (LEXAPRO) 20 MG tablet TAKE 1 TABLET(20 MG) BY MOUTH DAILY 12/06/19  Yes Sowles, Danna HeftyKrichna, MD  guaiFENesin-codeine (ROBITUSSIN AC) 100-10 MG/5ML syrup Take 10 mLs by mouth 4 (four) times daily as needed for up to 7 days for cough. 02/15/20 02/22/20 Yes Eusebio FriendlyEaves, Cross Jorge B, PA-C  ibuprofen (ADVIL) 800 MG tablet Take 1 tablet (800 mg total) by mouth every 8 (eight) hours as needed for up to 7 days. 02/15/20 02/22/20 Yes Shirlee LatchEaves, Braylie Badami B, PA-C  QUEtiapine (SEROQUEL) 25 MG tablet Take 1 tablet (25 mg total) by mouth at bedtime. 02/10/20  Yes Sowles, Danna HeftyKrichna, MD  amphetamine-dextroamphetamine (ADDERALL XR) 10 MG 24 hr capsule Take 1 capsule (10 mg total) by mouth daily. 02/10/20   Alba CorySowles, Krichna, MD  EPINEPHrine 0.3 mg/0.3 mL IJ SOAJ injection Inject 0.3 mLs (0.3 mg total) into the muscle as needed for anaphylaxis. 06/30/19   Alba CorySowles, Krichna, MD  fluticasone (FLONASE) 50 MCG/ACT nasal spray Place 2 sprays into both nostrils daily. 01/18/19 04/01/19  Rodriguez-Southworth, Nettie ElmSylvia, PA-C    Family History Family History  Problem Relation Age of Onset  . Varicose Veins Mother   . Endometriosis Mother   . Drug abuse Father   . GI Disease Father   . Heart attack Maternal Grandmother   . Breast cancer Maternal Grandmother   . Thyroid cancer Maternal Grandmother   . Congestive Heart Failure Maternal Grandfather   .  Heart attack Maternal Grandfather   . Stroke Maternal Grandfather   . Breast cancer Paternal Grandmother   . Heart attack Paternal Grandfather     Social History Social History   Tobacco Use  . Smoking status: Former Smoker    Packs/day: 0.25    Years: 15.00    Pack years: 3.75    Types: Cigarettes, E-cigarettes    Start date: 11/10/1999    Quit date: 06/05/2017    Years since quitting: 2.6  . Smokeless tobacco: Never Used  . Tobacco comment: e cig mostly  Vaping Use  . Vaping Use: Every day  . Start date: 11/28/2011  .  Substances: Nicotine, Flavoring  . Devices: Box Mod  Substance Use Topics  . Alcohol use: No    Alcohol/week: 0.0 standard drinks  . Drug use: No     Allergies   Bee venom and Penicillins   Review of Systems Review of Systems  Constitutional: Positive for fatigue. Negative for fever.  HENT: Positive for congestion, rhinorrhea and sore throat. Negative for sinus pressure and sinus pain.   Respiratory: Positive for cough and shortness of breath. Negative for wheezing.   Cardiovascular: Negative for chest pain.  Gastrointestinal: Negative for abdominal pain, diarrhea, nausea and vomiting.  Musculoskeletal: Positive for myalgias.  Neurological: Positive for headaches. Negative for weakness and light-headedness.  Hematological: Negative for adenopathy.     Physical Exam Triage Vital Signs ED Triage Vitals  Enc Vitals Group     BP 02/15/20 1132 129/79     Pulse Rate 02/15/20 1132 (!) 102     Resp 02/15/20 1132 16     Temp 02/15/20 1132 98.5 F (36.9 C)     Temp Source 02/15/20 1132 Oral     SpO2 02/15/20 1132 98 %     Weight 02/15/20 1129 255 lb (115.7 kg)     Height 02/15/20 1129 6\' 2"  (1.88 m)     Head Circumference --      Peak Flow --      Pain Score 02/15/20 1129 2     Pain Loc --      Pain Edu? --      Excl. in GC? --    No data found.  Updated Vital Signs BP 129/79 (BP Location: Right Arm)   Pulse (!) 102   Temp 98.5 F (36.9 C) (Oral)   Resp 16   Ht 6\' 2"  (1.88 m)   Wt 255 lb (115.7 kg)   SpO2 98%   BMI 32.74 kg/m       Physical Exam Vitals and nursing note reviewed.  Constitutional:      General: He is not in acute distress.    Appearance: Normal appearance. He is well-developed and well-nourished. He is ill-appearing. He is not toxic-appearing or diaphoretic.  HENT:     Head: Normocephalic and atraumatic.     Nose: Congestion and rhinorrhea present.     Mouth/Throat:     Mouth: Mucous membranes are normal. Mucous membranes are moist.      Pharynx: Oropharynx is clear. Uvula midline. Posterior oropharyngeal erythema present. No oropharyngeal exudate.     Tonsils: No tonsillar abscesses.  Eyes:     General: No scleral icterus.       Right eye: No discharge.        Left eye: No discharge.     Extraocular Movements: EOM normal.     Conjunctiva/sclera: Conjunctivae normal.  Neck:     Thyroid: No thyromegaly.  Trachea: No tracheal deviation.  Cardiovascular:     Rate and Rhythm: Normal rate and regular rhythm.     Heart sounds: Normal heart sounds.  Pulmonary:     Effort: Pulmonary effort is normal. No respiratory distress.     Breath sounds: Normal breath sounds. No wheezing, rhonchi or rales.  Musculoskeletal:     Cervical back: Normal range of motion and neck supple.  Lymphadenopathy:     Cervical: No cervical adenopathy.  Skin:    General: Skin is warm and dry.     Findings: No rash.  Neurological:     General: No focal deficit present.     Mental Status: He is alert. Mental status is at baseline.     Motor: No weakness.     Gait: Gait normal.  Psychiatric:        Mood and Affect: Mood normal.        Behavior: Behavior normal.        Thought Content: Thought content normal.      UC Treatments / Results  Labs (all labs ordered are listed, but only abnormal results are displayed) Labs Reviewed  SARS CORONAVIRUS 2 (TAT 6-24 HRS)    EKG   Radiology No results found.  Procedures Procedures (including critical care time)  Medications Ordered in UC Medications - No data to display  Initial Impression / Assessment and Plan / UC Course  I have reviewed the triage vital signs and the nursing notes.  Pertinent labs & imaging results that were available during my care of the patient were reviewed by me and considered in my medical decision making (see chart for details).   Swab for repeat COVID test.  Advised him that I suspect viral infection, possibly COVID-19 given exposure.  Advised supportive  care at this time.  He says he has tried multiple over-the-counter cough syrups without relief, so I have prescribed Cheratussin.  Controlled substance database reviewed and patient low risk for abuse.  Also sent in ibuprofen for aches.  Current CDC guidelines, isolation protocol and ED precautions reviewed with patient.  Work note provided for the next couple of days.   Final Clinical Impressions(s) / UC Diagnoses   Final diagnoses:  Viral illness  Cough  Body aches  Exposure to COVID-19 virus     Discharge Instructions     You have received COVID testing today either for positive exposure, concerning symptoms that could be related to COVID infection, screening purposes, or re-testing after confirmed positive.  Your test obtained today checks for active viral infection in the last 1-2 weeks. If your test is negative now, you can still test positive later. So, if you do develop symptoms you should either get re-tested and/or isolate x 5 days and then strict mask use x 5 days (unvaccinated) or mask use x 10 days (vaccinated). Please follow CDC guidelines.  While Rapid antigen tests come back in 15-20 minutes, send out PCR/molecular test results typically come back within 1-3 days. In the mean time, if you are symptomatic, assume this could be a positive test and treat/monitor yourself as if you do have COVID.   We will call with test results if positive. Please download the MyChart app and set up a profile to access test results.   If symptomatic, go home and rest. Push fluids. Take Tylenol as needed for discomfort. Gargle warm salt water. Throat lozenges. Take Mucinex DM or Robitussin for cough. Humidifier in bedroom to ease coughing. Warm showers. Also review  the COVID handout for more information.  COVID-19 INFECTION: The incubation period of COVID-19 is approximately 14 days after exposure, with most symptoms developing in roughly 4-5 days. Symptoms may range in severity from mild to  critically severe. Roughly 80% of those infected will have mild symptoms. People of any age may become infected with COVID-19 and have the ability to transmit the virus. The most common symptoms include: fever, fatigue, cough, body aches, headaches, sore throat, nasal congestion, shortness of breath, nausea, vomiting, diarrhea, changes in smell and/or taste.    COURSE OF ILLNESS Some patients may begin with mild disease which can progress quickly into critical symptoms. If your symptoms are worsening please call ahead to the Emergency Department and proceed there for further treatment. Recovery time appears to be roughly 1-2 weeks for mild symptoms and 3-6 weeks for severe disease.   GO IMMEDIATELY TO ER FOR FEVER YOU ARE UNABLE TO GET DOWN WITH TYLENOL, BREATHING PROBLEMS, CHEST PAIN, FATIGUE, LETHARGY, INABILITY TO EAT OR DRINK, ETC  QUARANTINE AND ISOLATION: To help decrease the spread of COVID-19 please remain isolated if you have COVID infection or are highly suspected to have COVID infection. This means -stay home and isolate to one room in the home if you live with others. Do not share a bed or bathroom with others while ill, sanitize and wipe down all countertops and keep common areas clean and disinfected. Stay home for 5 days. If you have no symptoms or your symptoms are resolving after 5 days, you can leave your house. Continue to wear a mask around others for 5 additional days. If you have been in close contact (within 6 feet) of someone diagnosed with COVID 19, you are advised to quarantine in your home for 14 days as symptoms can develop anywhere from 2-14 days after exposure to the virus. If you develop symptoms, you  must isolate.  Most current guidelines for COVID after exposure -unvaccinated: isolate 5 days and strict mask use x 5 days. Test on day 5 is possible -vaccinated: wear mask x 10 days if symptoms do not develop -You do not necessarily need to be tested for COVID if you have +  exposure and  develop symptoms. Just isolate at home x10 days from symptom onset During this global pandemic, CDC advises to practice social distancing, try to stay at least 536ft away from others at all times. Wear a face covering. Wash and sanitize your hands regularly and avoid going anywhere that is not necessary.  KEEP IN MIND THAT THE COVID TEST IS NOT 100% ACCURATE AND YOU SHOULD STILL DO EVERYTHING TO PREVENT POTENTIAL SPREAD OF VIRUS TO OTHERS (WEAR MASK, WEAR GLOVES, WASH HANDS AND SANITIZE REGULARLY). IF INITIAL TEST IS NEGATIVE, THIS MAY NOT MEAN YOU ARE DEFINITELY NEGATIVE. MOST ACCURATE TESTING IS DONE 5-7 DAYS AFTER EXPOSURE.   It is not advised by CDC to get re-tested after receiving a positive COVID test since you can still test positive for weeks to months after you have already cleared the virus.   *If you have not been vaccinated for COVID, I strongly suggest you consider getting vaccinated as long as there are no contraindications.      ED Prescriptions    Medication Sig Dispense Auth. Provider   guaiFENesin-codeine (ROBITUSSIN AC) 100-10 MG/5ML syrup Take 10 mLs by mouth 4 (four) times daily as needed for up to 7 days for cough. 120 mL Eusebio FriendlyEaves, Emerald Gehres B, PA-C   ibuprofen (ADVIL) 800 MG tablet Take 1  tablet (800 mg total) by mouth every 8 (eight) hours as needed for up to 7 days. 21 tablet Shirlee Latch, PA-C     I have reviewed the PDMP during this encounter.   Shirlee Latch, PA-C 02/15/20 1201

## 2020-02-15 NOTE — Discharge Instructions (Signed)

## 2020-02-15 NOTE — ED Triage Notes (Signed)
Patient was seen in the [facility] on 02/11/19.  Patient has been having ongoing cough and chest congestion that has gotten worse.  Patient also reports nasal congestion, Has and bodyaches. Patient denies fevers.

## 2020-02-16 ENCOUNTER — Telehealth: Payer: Self-pay

## 2020-02-16 NOTE — Telephone Encounter (Signed)
Called to discuss with patient about COVID-19 symptoms and the use of one of the available treatments for those with mild to moderate Covid symptoms and at a high risk of hospitalization.  Pt appears to qualify for outpatient treatment due to co-morbid conditions and/or a member of an at-risk group in accordance with the FDA Emergency Use Authorization.    Symptom onset: 02/11/20 Vaccinated: No Booster? No Immunocompromised? No Qualifiers: None  Unable to reach pt - Reached pt. Has no qualifiers and states he is feeling better.  Esther Hardy
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
## 2020-03-15 NOTE — Progress Notes (Unsigned)
Name: Timothy Fleming   MRN: [id number]    DOB: 21-Jan-1986   Date:03/16/2020       Progress Note  Subjective  Chief Complaint  Follow Up  HPI   DepressionMajor: he was doing well, however back in May his boss called him in to his office because he had noticed he has been making mild mistakes and being forgetful at work. He states he cried during the meeting and was unable to stop crying afterwards and had to leave work and go home. He was feeling down and lack of motivation, we resumed Lexapro, also added Adderal for energy, however stress at work is even higher, feels harressed by his boss, has to justify when to use the bathroom. 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   GERD: under control with otc medication, no heartburn or regurgitation. He gets otc medication - PPI. Discussed long term use of PPI. Doing well unless he skips for a couple days   Insomnia; taking Seroquelmost nights and is able to fall and stay asleep without any side effects.He has medications at home   History of back injury: it happened at work a couple of months ago, he had PT and symptoms resolved since Fall of 2020, however he another fall at work in 2021 and hit lower back but symptoms also resolved. He is currently neck to mid back pain, described as throbbing, constant, worse when he tries to stretch his back. Pain has been under control, mild at this time   Pre-diabetes:Denies polyphagia, polyuria or polydipsia.He gained a little weight again. He has not been eating as healthy lately   Patient Active Problem List   Diagnosis Date Noted  . Ankle instability 12/03/2017  . Sprain of ankle 11/19/2017  . Metabolic syndrome 12/13/2016  . Moderate major depression (HCC) 11/30/2016  . History of vasculitis of skin 05/20/2015  . GERD without esophagitis 11/10/2014  . Seasonal allergic rhinitis 11/10/2014  . History of nephrolithiasis  11/10/2014  . Obesity (BMI 30-39.9) 11/10/2014  . Tobacco abuse 11/10/2014  . Snoring 11/10/2014  . Insomnia 11/10/2014    Past Surgical History:  Procedure Laterality Date  . FACIAL RECONSTRUCTION SURGERY  01/13/2008   After a car fell on him while working on it    Family History  Problem Relation Age of Onset  . Varicose Veins Mother   . Endometriosis Mother   . Drug abuse Father   . GI Disease Father   . Heart attack Maternal Grandmother   . Breast cancer Maternal Grandmother   . Thyroid cancer Maternal Grandmother   . Congestive Heart Failure Maternal Grandfather   . Heart attack Maternal Grandfather   . Stroke Maternal Grandfather   . Breast cancer Paternal Grandmother   . Heart attack Paternal Grandfather     Social History   Tobacco Use  . Smoking status: Former Smoker    Packs/day: 0.25    Years: 15.00    Pack years: 3.75    Types: Cigarettes, E-cigarettes    Start date: 11/10/1999    Quit date: 06/05/2017    Years since quitting: 2.7  . Smokeless tobacco: Never Used  . Tobacco comment: e cig mostly  Substance Use Topics  . Alcohol use: No    Alcohol/week: 0.0 standard drinks     Current Outpatient Medications:  .  amphetamine-dextroamphetamine (ADDERALL XR) 10 MG 24 hr capsule, Take 1 capsule (10  mg total) by mouth daily., Disp: 30 capsule, Rfl: 0 .  amphetamine-dextroamphetamine (ADDERALL XR) 10 MG 24 hr capsule, Take 1 capsule (10 mg total) by mouth daily., Disp: 30 capsule, Rfl: 0 .  EPINEPHrine 0.3 mg/0.3 mL IJ SOAJ injection, Inject 0.3 mLs (0.3 mg total) into the muscle as needed for anaphylaxis., Disp: 2 each, Rfl: 0 .  escitalopram (LEXAPRO) 20 MG tablet, TAKE 1 TABLET(20 MG) BY MOUTH DAILY, Disp: 90 tablet, Rfl: 0 .  QUEtiapine (SEROQUEL) 25 MG tablet, Take 1 tablet (25 mg total) by mouth at bedtime., Disp: 90 tablet, Rfl: 1  Allergies  Allergen Reactions  . Bee Venom Anaphylaxis, Shortness Of Breath and Swelling  . Penicillins Hives     vasculitis    I personally reviewed active problem list, medication list, allergies, family history, social history, health maintenance with the patient/caregiver today.   ROS  Constitutional: Negative for fever or significant weight change.  Respiratory: Negative for cough and shortness of breath.   Cardiovascular: Negative for chest pain or palpitations.  Gastrointestinal: Negative for abdominal pain, no bowel changes.  Musculoskeletal: Negative for gait problem or joint swelling.  Skin: Negative for rash.  Neurological: Negative for dizziness or headache.  No other specific complaints in a complete review of systems (except as listed in HPI above).  Objective  Vitals:   03/16/20 1417  BP: 126/82  Pulse: 86  Resp: 16  Temp: 98.3 F (36.8 C)  TempSrc: Oral  SpO2: 98%  Weight: 255 lb 11.2 oz (116 kg)  Height: 6\' 1"  (1.854 m)    Body mass index is 33.74 kg/m.  Physical Exam  Constitutional: Patient appears well-developed and well-nourished. Obese  No distress.  HEENT: head atraumatic, normocephalic, pupils equal and reactive to light,  neck supple Cardiovascular: Normal rate, regular rhythm and normal heart sounds.  No murmur heard. No BLE edema. Pulmonary/Chest: Effort normal and breath sounds normal. No respiratory distress. Abdominal: Soft.  There is no tenderness. Psychiatric: Patient has a normal mood and affect. behavior is normal. Judgment and thought content normal.  Recent Results (from the past 2160 hour(s))  CBC with Differential/Platelet     Status: None   Collection Time: 01/14/20  9:20 AM  Result Value Ref Range   WBC 8.6 3.8 - 10.8 Thousand/uL   RBC 5.26 4.20 - 5.80 Million/uL   Hemoglobin 15.8 13.2 - 17.1 g/dL   HCT 16.146.6 09.638.5 - 04.550.0 %   MCV 88.6 80.0 - 100.0 fL   MCH 30.0 27.0 - 33.0 pg   MCHC 33.9 32.0 - 36.0 g/dL   RDW 40.912.6 81.111.0 - 91.415.0 %   Platelets 367 140 - 400 Thousand/uL   MPV 9.7 7.5 - 12.5 fL   Neutro Abs 5,040 1,500 - 7,800 cells/uL    Lymphs Abs 2,468 850 - 3,900 cells/uL   Absolute Monocytes 826 200 - 950 cells/uL   Eosinophils Absolute 215 15 - 500 cells/uL   Basophils Absolute 52 0 - 200 cells/uL   Neutrophils Relative % 58.6 %   Total Lymphocyte 28.7 %   Monocytes Relative 9.6 %   Eosinophils Relative 2.5 %   Basophils Relative 0.6 %  COMPLETE METABOLIC PANEL WITH GFR     Status: Abnormal   Collection Time: 01/14/20  9:20 AM  Result Value Ref Range   Glucose, Bld 105 (H) 65 - 99 mg/dL    Comment: .            Fasting reference interval . For someone without  known diabetes, a glucose value between 100 and 125 mg/dL is consistent with prediabetes and should be confirmed with a follow-up test. .    BUN 14 7 - 25 mg/dL   Creat 9.60 4.54 - 0.98 mg/dL   GFR, Est Non African American 87 > OR = 60 mL/min/1.75m2   GFR, Est African American 100 > OR = 60 mL/min/1.62m2   BUN/Creatinine Ratio NOT APPLICABLE 6 - 22 (calc)   Sodium 139 135 - 146 mmol/L   Potassium 4.6 3.5 - 5.3 mmol/L   Chloride 102 98 - 110 mmol/L   CO2 29 20 - 32 mmol/L   Calcium 10.0 8.6 - 10.3 mg/dL   Total Protein 7.8 6.1 - 8.1 g/dL   Albumin 4.7 3.6 - 5.1 g/dL   Globulin 3.1 1.9 - 3.7 g/dL (calc)   AG Ratio 1.5 1.0 - 2.5 (calc)   Total Bilirubin 0.5 0.2 - 1.2 mg/dL   Alkaline phosphatase (APISO) 67 36 - 130 U/L   AST 14 10 - 40 U/L   ALT 18 9 - 46 U/L  Lipid panel     Status: Abnormal   Collection Time: 01/14/20  9:20 AM  Result Value Ref Range   Cholesterol 215 (H) <200 mg/dL   HDL 40 > OR = 40 mg/dL   Triglycerides 119 (H) <150 mg/dL    Comment: . If a non-fasting specimen was collected, consider repeat triglyceride testing on a fasting specimen if clinically indicated.  [place] Mount et al. J. of Clin. Lipidol. 2015;9:129-169. Marland Kitchen    LDL Cholesterol (Calc) 131 (H) mg/dL (calc)    Comment: Reference range: <100 . Desirable range <100 mg/dL for primary prevention;   <70 mg/dL for patients with CHD or diabetic patients  with > or = 2  CHD risk factors. Marland Kitchen LDL-C is now calculated using the Martin-Hopkins  calculation, which is a validated novel method providing  better accuracy than the Friedewald equation in the  estimation of LDL-C.  Horald Pollen et al. Lenox Ahr. 1478;295(62): 2061-2068  (http://education.[facility].com/faq/FAQ164)    Total CHOL/HDL Ratio 5.4 (H) <5.0 (calc)   Non-HDL Cholesterol (Calc) 175 (H) <130 mg/dL (calc)    Comment: For patients with diabetes plus 1 major ASCVD risk  factor, treating to a non-HDL-C goal of <100 mg/dL  (LDL-C of <13 mg/dL) is considered a therapeutic  option.   Hemoglobin A1c     Status: Abnormal   Collection Time: 01/14/20  9:20 AM  Result Value Ref Range   Hgb A1c MFr Bld 5.7 (H) <5.7 % of total Hgb    Comment: For someone without known diabetes, a hemoglobin  A1c value between 5.7% and 6.4% is consistent with prediabetes and should be confirmed with a  follow-up test. . For someone with known diabetes, a value <7% indicates that their diabetes is well controlled. A1c targets should be individualized based on duration of diabetes, age, comorbid conditions, and other considerations. . This assay result is consistent with an increased risk of diabetes. . Currently, no consensus exists regarding use of hemoglobin A1c for diagnosis of diabetes for children. .    Mean Plasma Glucose 117 mg/dL   eAG (mmol/L) 6.5 mmol/L  HIV Antibody (routine testing w rflx)     Status: None   Collection Time: 01/14/20  9:20 AM  Result Value Ref Range   HIV 1&2 Ab, 4th Generation NON-REACTIVE NON-REACTI    Comment: HIV-1 antigen and HIV-1/HIV-2 antibodies were not detected. There is no laboratory evidence of HIV infection. Marland Kitchen  PLEASE NOTE: This information has been disclosed to you from records whose confidentiality may be protected by state law.  If your state requires such protection, then the state law prohibits you from making any further disclosure of the  information without the specific written consent of the person to whom it pertains, or as otherwise permitted by law. A general authorization for the release of medical or other information is NOT sufficient for this purpose. . For additional information please refer to http://education.[facility].com/faq/FAQ106 (This link is being provided for informational/ educational purposes only.) . Marland Kitchen The performance of this assay has not been clinically validated in patients less than 42 years old. Marland Kitchen   SARS CORONAVIRUS 2 (TAT 6-24 HRS) Nasopharyngeal Nasopharyngeal Swab     Status: None   Collection Time: 02/11/20  1:13 PM   Specimen: Nasopharyngeal Swab  Result Value Ref Range   SARS Coronavirus 2 NEGATIVE NEGATIVE    Comment: (NOTE) SARS-CoV-2 target nucleic acids are NOT DETECTED.  The SARS-CoV-2 RNA is generally detectable in upper and lower respiratory specimens during the acute phase of infection. Negative results do not preclude SARS-CoV-2 infection, do not rule out co-infections with other pathogens, and should not be used as the sole basis for treatment or other patient management decisions. Negative results must be combined with clinical observations, patient history, and epidemiological information. The expected result is Negative.  Fact Sheet for Patients: HairSlick.no  Fact Sheet for Healthcare Providers: quierodirigir.com  This test is not yet approved or cleared by the [place] FDA and  has been authorized for detection and/or diagnosis of SARS-CoV-2 by FDA under an Emergency Use Authorization (EUA). This EUA will remain  in effect (meaning this test can be used) for the duration of the COVID-19 declaration under Se ction 564(b)(1) of the Act, 21 U.S.C. section 360bbb-3(b)(1), unless the authorization is terminated or revoked sooner.  Performed at [facility], [address], [place],  [place] [address]   SARS CORONAVIRUS 2 (TAT 6-24 HRS) Nasopharyngeal Nasopharyngeal Swab     Status: Abnormal   Collection Time: 02/15/20 11:34 AM   Specimen: Nasopharyngeal Swab  Result Value Ref Range   SARS Coronavirus 2 POSITIVE (A) NEGATIVE    Comment: (NOTE) SARS-CoV-2 target nucleic acids are DETECTED.  The SARS-CoV-2 RNA is generally detectable in upper and lower respiratory specimens during the acute phase of infection. Positive results are indicative of the presence of SARS-CoV-2 RNA. Clinical correlation with patient history and other diagnostic information is  necessary to determine patient infection status. Positive results do not rule out bacterial infection or co-infection with other viruses.  The expected result is Negative.  Fact Sheet for Patients: HairSlick.no  Fact Sheet for Healthcare Providers: quierodirigir.com  This test is not yet approved or cleared by the [place] FDA and  has been authorized for detection and/or diagnosis of SARS-CoV-2 by FDA under an Emergency Use Authorization (EUA). This EUA will remain  in effect (meaning this test can be used) for the duration of the COVID-19 declaration under Section 564(b)(1) of the Act, 21 U. S.C. section 360bbb-3(b)(1), unless the authorization is terminated or revoked sooner.   Performed at [facility], [address] [place], [place] [address]      PHQ2/9: Depression screen [facility] 2/9 03/16/2020 01/13/2020 12/09/2019 10/29/2019 06/06/2019  Decreased Interest 0 0 0 3 0  Down, Depressed, Hopeless 2 0 0 3 0  PHQ - 2 Score 2 0 0 6 0  Altered sleeping 0  3 1 3 3   Tired, decreased energy 0 0 0 2 1  Change in appetite 0 0 0 0 0  Feeling bad or failure about yourself  1 0 0 1 0  Trouble concentrating 1 0 0 0 0  Moving slowly or fidgety/restless 0 0 0 2 0  Suicidal thoughts 0 0 0 0 0  PHQ-9 Score 4 3 1 14 4   Difficult doing work/chores - - - - Not  difficult at all  Some recent data might be hidden    phq 9 is positive   Fall Risk: Fall Risk  03/16/2020 01/13/2020 12/09/2019 10/29/2019 06/06/2019  Falls in the past year? 0 1 0 0 1  Number falls in past yr: 0 0 0 - 0  Injury with Fall? 0 0 0 - 1  Comment - - - - -  Follow up - - - - -      Functional Status Survey: Is the patient deaf or have difficulty hearing?: No Does the patient have difficulty seeing, even when wearing glasses/contacts?: No Does the patient have difficulty concentrating, remembering, or making decisions?: Yes Does the patient have difficulty walking or climbing stairs?: No Does the patient have difficulty dressing or bathing?: No Does the patient have difficulty doing errands alone such as visiting a doctor's office or shopping?: No    Assessment & Plan  1. Mild episode of recurrent major depressive disorder (HCC)  - escitalopram (LEXAPRO) 20 MG tablet; TAKE 1 TABLET(20 MG) BY MOUTH DAILY  Dispense: 90 tablet; Refill: 0 - amphetamine-dextroamphetamine (ADDERALL XR) 15 MG 24 hr capsule; Take 1 capsule by mouth daily.  Dispense: 90 capsule; Refill: 0  2. Dyslipidemia  Discussed healthy diet   3. Metabolic syndrome  Last A1C in the pre-diabetes range   4. GERD without esophagitis   5. Obesity (BMI 30-39.9)  Discussed with the patient the risk posed by an increased BMI. Discussed importance of portion control, calorie counting and at least 150 minutes of physical activity weekly. Avoid sweet beverages and drink more water. Eat at least 6 servings of fruit and vegetables daily

## 2020-03-16 ENCOUNTER — Encounter: Payer: Self-pay | Admitting: Family Medicine

## 2020-03-16 ENCOUNTER — Other Ambulatory Visit: Payer: Self-pay

## 2020-03-16 ENCOUNTER — Ambulatory Visit: Payer: 59 | Admitting: Family Medicine

## 2020-03-16 VITALS — BP 126/82 | HR 86 | Temp 98.3°F | Resp 16 | Ht 73.0 in | Wt 255.7 lb

## 2020-03-16 DIAGNOSIS — E8881 Metabolic syndrome: Secondary | ICD-10-CM | POA: Diagnosis not present

## 2020-03-16 DIAGNOSIS — E785 Hyperlipidemia, unspecified: Secondary | ICD-10-CM

## 2020-03-16 DIAGNOSIS — K219 Gastro-esophageal reflux disease without esophagitis: Secondary | ICD-10-CM

## 2020-03-16 DIAGNOSIS — R69 Illness, unspecified: Secondary | ICD-10-CM | POA: Diagnosis not present

## 2020-03-16 DIAGNOSIS — E669 Obesity, unspecified: Secondary | ICD-10-CM

## 2020-03-16 DIAGNOSIS — F330 Major depressive disorder, recurrent, mild: Secondary | ICD-10-CM | POA: Diagnosis not present

## 2020-03-16 MED ORDER — ESCITALOPRAM OXALATE 20 MG PO TABS: ORAL_TABLET | ORAL | 0 refills | Status: DC

## 2020-03-16 MED ORDER — AMPHETAMINE-DEXTROAMPHET ER 15 MG PO CP24: 15.0000 mg | ORAL_CAPSULE | Freq: Every day | ORAL | 0 refills | Status: DC

## 2020-03-17 ENCOUNTER — Telehealth: Payer: Self-pay | Admitting: Family Medicine

## 2020-03-17 DIAGNOSIS — F330 Major depressive disorder, recurrent, mild: Secondary | ICD-10-CM

## 2020-03-17 MED ORDER — AMPHETAMINE-DEXTROAMPHET ER 15 MG PO CP24: 15.0000 mg | ORAL_CAPSULE | Freq: Every day | ORAL | 0 refills | Status: DC

## 2020-03-17 NOTE — Telephone Encounter (Signed)
Pt states that the pharmacy told him that his insurance will not cover a 90 day supply, pt needs on going Rx for 30 days for his  amphetamine-dextroamphetamine (ADDERALL XR) 15 MG 24 hr capsule  [id number] - [place], [place] - [address] AT [facility] [address] [place]  [address] [place] [address]  Phone: [phone] Fax: [phone]

## 2020-05-25 ENCOUNTER — Other Ambulatory Visit: Payer: Self-pay | Admitting: Family Medicine

## 2020-05-25 DIAGNOSIS — F330 Major depressive disorder, recurrent, mild: Secondary | ICD-10-CM

## 2020-05-25 MED ORDER — AMPHETAMINE-DEXTROAMPHET ER 15 MG PO CP24: 15.0000 mg | ORAL_CAPSULE | Freq: Every day | ORAL | 0 refills | Status: DC

## 2020-05-25 NOTE — Telephone Encounter (Signed)
Requested medication (s) are due for refill today: yes  Requested medication (s) are on the active medication list: yes  Future visit scheduled: no  Notes to clinic:  Patient states that insurance will only cover a 90 day supply    Requested Prescriptions  Pending Prescriptions Disp Refills   amphetamine-dextroamphetamine (ADDERALL XR) 15 MG 24 hr capsule 30 capsule 0    Sig: Take 1 capsule by mouth daily.      There is no refill protocol information for this order

## 2020-05-25 NOTE — Telephone Encounter (Signed)
Pt has an appt on 06/14/20

## 2020-05-25 NOTE — Telephone Encounter (Signed)
Copied from CRM [id number]. Topic: Quick Communication - Rx Refill/Question >> May 25, 2020  9:47 AM [name] wrote: Pt stated his insurance will not cover 90 day supply.  Medication: amphetamine-dextroamphetamine (ADDERALL XR) 15 MG 24 hr capsule  Has the patient contacted their pharmacy? yes  Preferred Pharmacy (with phone number or street name): [facility][id number] [place], [place] - [address] AT [facility] OF [address] [facility] Phone: [phone]   Fax: [phone]  Agent: Please be advised that RX refills may take up to 3 business days. We ask that you follow-up with your pharmacy.

## 2020-06-11 NOTE — Progress Notes (Signed)
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 Stress level is down even though he has more physical work to do . He would like to go down on dose of Lexapro 10 mg, Adderal XR helps with focus and his work Copy, seroquel for sleep    GERD: under control with otc medication, no heartburn or regurgitation. He gets otc medication - PPI. Discussed long term use of PPI.   Insomnia; taking Seroquelevery night, denies side effects. He is able to fall and stay asleep   Paresthesia : he states symptoms started a couple of months ago, he changed jobs about 3 months ago. He states not every morning, it is usually both arms, he states it does not matter the position he is sleeping . It resolves by itself after about 40 minutes, no pain on shoulder and no weakness . Pain is on upper back and occasionally on neck  History of back injury: it happened at work a couple of months ago, he had PT and symptoms resolved since Fall of 2020, however he another fall at work in 2021 and hit lower back but symptoms also resolved. He has intermittent soreness on upper back after a long day at work   Pre-diabetes:Denies polyphagia, polyuria or polydipsia.He has lost weight since last visit. 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 He states he has been more active and lost 10 lbs. He states emotionally doing much better.   Patient Active Problem List   Diagnosis Date Noted  . Ankle instability 12/03/2017  . Metabolic syndrome 12/13/2016  . Moderate major depression (HCC) 11/30/2016  . History of vasculitis of skin 05/20/2015  . GERD without  esophagitis 11/10/2014  . Seasonal allergic rhinitis 11/10/2014  . History of nephrolithiasis 11/10/2014  . Obesity (BMI 30-39.9) 11/10/2014  . Tobacco abuse 11/10/2014  . Snoring 11/10/2014  . Insomnia 11/10/2014    Past Surgical History:  Procedure Laterality Date  . FACIAL RECONSTRUCTION SURGERY  01/13/2008   After a car fell on him while working on it    Family History  Problem Relation Age of Onset  . Varicose Veins Mother   . Endometriosis Mother   . Drug abuse Father   . GI Disease Father   . Heart attack Maternal Grandmother   . Breast cancer Maternal Grandmother   . Thyroid cancer Maternal Grandmother   . Congestive Heart Failure Maternal Grandfather   . Heart attack Maternal Grandfather   . Stroke Maternal Grandfather   . Breast cancer Paternal Grandmother   . Heart attack Paternal Grandfather     Social History   Tobacco Use  . Smoking status: Former Smoker    Packs/day: 0.25    Years: 15.00    Pack years: 3.75    Types: Cigarettes, E-cigarettes    Start date: 11/10/1999    Quit date: 06/05/2017    Years since quitting: 3.0  . Smokeless tobacco: Never Used  . Tobacco comment: e cig mostly  Substance Use Topics  . Alcohol use: No    Alcohol/week: 0.0 standard drinks  Current Outpatient Medications:  .  amphetamine-dextroamphetamine (ADDERALL XR) 15 MG 24 hr capsule, Take 1 capsule by mouth daily., Disp: 30 capsule, Rfl: 0 .  EPINEPHrine 0.3 mg/0.3 mL IJ SOAJ injection, Inject 0.3 mLs (0.3 mg total) into the muscle as needed for anaphylaxis., Disp: 2 each, Rfl: 0 .  escitalopram (LEXAPRO) 20 MG tablet, TAKE 1 TABLET(20 MG) BY MOUTH DAILY, Disp: 90 tablet, Rfl: 0 .  omeprazole (PRILOSEC) 20 MG capsule, Take 20 mg by mouth daily., Disp: , Rfl:  .  QUEtiapine (SEROQUEL) 25 MG tablet, Take 1 tablet (25 mg total) by mouth at bedtime., Disp: 90 tablet, Rfl: 1  Allergies  Allergen Reactions  . Bee Venom Anaphylaxis, Shortness Of Breath and Swelling  .  Penicillins Hives    vasculitis    I personally reviewed active problem list, medication list, allergies, family history, social history, health maintenance with the patient/caregiver today.   ROS  Constitutional: Negative for fever , positive for weight change.  Respiratory: Negative for cough and shortness of breath.   Cardiovascular: Negative for chest pain or palpitations.  Gastrointestinal: Negative for abdominal pain, no bowel changes.  Musculoskeletal: Negative for gait problem or joint swelling.  Skin: Negative for rash.  Neurological: Negative for dizziness or headache.  No other specific complaints in a complete review of systems (except as listed in HPI above).  Objective  Vitals:   06/14/20 1545  BP: 130/84  Pulse: 86  Resp: 16  Temp: 97.6 F (36.4 C)  TempSrc: Oral  SpO2: 100%  Weight: 246 lb (111.6 kg)  Height: 6\' 1"  (1.854 m)    Body mass index is 32.46 kg/m.  Physical Exam  Constitutional: Patient appears well-developed and well-nourished. Obese  No distress.  HEENT: head atraumatic, normocephalic, pupils equal and reactive to light,  neck supple Cardiovascular: Normal rate, regular rhythm and normal heart sounds.  No murmur heard. No BLE edema. Pulmonary/Chest: Effort normal and breath sounds normal. No respiratory distress. Abdominal: Soft.  There is no tenderness. Muscular skeletal: normal grip, normal sensation  Psychiatric: Patient has a normal mood and affect. behavior is normal. Judgment and thought content normal.  PHQ2/9: Depression screen [facility] 2/9 06/14/2020 03/16/2020 01/13/2020 12/09/2019 10/29/2019  Decreased Interest 0 0 0 0 3  Down, Depressed, Hopeless 0 2 0 0 3  PHQ - 2 Score 0 2 0 0 6  Altered sleeping 0 0 3 1 3   Tired, decreased energy 0 0 0 0 2  Change in appetite 0 0 0 0 0  Feeling bad or failure about yourself  0 1 0 0 1  Trouble concentrating 0 1 0 0 0  Moving slowly or fidgety/restless 0 0 0 0 2  Suicidal thoughts 0 0 0 0 0   PHQ-9 Score 0 4 3 1 14   Difficult doing work/chores - - - - -  Some recent data might be hidden    phq 9 is negative   Fall Risk: Fall Risk  06/14/2020 03/16/2020 01/13/2020 12/09/2019 10/29/2019  Falls in the past year? 0 0 1 0 0  Number falls in past yr: 0 0 0 0 -  Injury with Fall? 0 0 0 0 -  Comment - - - - -  Follow up - - - - -     Functional Status Survey: Is the patient deaf or have difficulty hearing?: No Does the patient have difficulty seeing, even when wearing glasses/contacts?: No Does the patient have difficulty concentrating, remembering, or making decisions?: Yes Does  the patient have difficulty walking or climbing stairs?: No Does the patient have difficulty dressing or bathing?: No Does the patient have difficulty doing errands alone such as visiting a doctor's office or shopping?: No    Assessment & Plan  1. Mild episode of recurrent major depressive disorder (HCC)  - amphetamine-dextroamphetamine (ADDERALL XR) 15 MG 24 hr capsule; Take 1 capsule by mouth daily.  Dispense: 30 capsule; Refill: 0 - escitalopram (LEXAPRO) 10 MG tablet; Take 1 tablet (10 mg total) by mouth daily.  Dispense: 90 tablet; Refill: 1 - amphetamine-dextroamphetamine (ADDERALL XR) 15 MG 24 hr capsule; Take 1 capsule by mouth every morning.  Dispense: 30 capsule; Refill: 0 - amphetamine-dextroamphetamine (ADDERALL XR) 15 MG 24 hr capsule; Take 1 capsule by mouth every morning.  Dispense: 30 capsule; Refill: 0  2. Other insomnia  - QUEtiapine (SEROQUEL) 25 MG tablet; Take 1 tablet (25 mg total) by mouth at bedtime.  Dispense: 90 tablet; Refill: 1   3. GERD without esophagitis   4. Dyslipidemia   5. Obesity (BMI 30-39.9)  Discussed with the patient the risk posed by an increased BMI. Discussed importance of portion control, calorie counting and at least 150 minutes of physical activity weekly. Avoid sweet beverages and drink more water. Eat at least 6 servings of fruit and  vegetables daily   6. Metabolic syndrome  Continue physical activity  7. Paresthesia

## 2020-06-14 ENCOUNTER — Other Ambulatory Visit: Payer: Self-pay

## 2020-06-14 ENCOUNTER — Ambulatory Visit: Payer: 59 | Admitting: Family Medicine

## 2020-06-14 ENCOUNTER — Encounter: Payer: Self-pay | Admitting: Family Medicine

## 2020-06-14 VITALS — BP 130/84 | HR 86 | Temp 97.6°F | Resp 16 | Ht 73.0 in | Wt 246.0 lb

## 2020-06-14 DIAGNOSIS — F330 Major depressive disorder, recurrent, mild: Secondary | ICD-10-CM

## 2020-06-14 DIAGNOSIS — E669 Obesity, unspecified: Secondary | ICD-10-CM

## 2020-06-14 DIAGNOSIS — E785 Hyperlipidemia, unspecified: Secondary | ICD-10-CM

## 2020-06-14 DIAGNOSIS — K219 Gastro-esophageal reflux disease without esophagitis: Secondary | ICD-10-CM

## 2020-06-14 DIAGNOSIS — R69 Illness, unspecified: Secondary | ICD-10-CM | POA: Diagnosis not present

## 2020-06-14 DIAGNOSIS — G4709 Other insomnia: Secondary | ICD-10-CM | POA: Diagnosis not present

## 2020-06-14 DIAGNOSIS — E8881 Metabolic syndrome: Secondary | ICD-10-CM

## 2020-06-14 DIAGNOSIS — R202 Paresthesia of skin: Secondary | ICD-10-CM | POA: Diagnosis not present

## 2020-06-14 MED ORDER — ESCITALOPRAM OXALATE 10 MG PO TABS: 10.0000 mg | ORAL_TABLET | Freq: Every day | ORAL | 1 refills | Status: DC

## 2020-06-14 MED ORDER — AMPHETAMINE-DEXTROAMPHET ER 15 MG PO CP24: 15.0000 mg | ORAL_CAPSULE | Freq: Every day | ORAL | 0 refills | Status: DC

## 2020-06-14 MED ORDER — AMPHETAMINE-DEXTROAMPHET ER 15 MG PO CP24: 15.0000 mg | ORAL_CAPSULE | ORAL | 0 refills | Status: DC

## 2020-06-14 MED ORDER — QUETIAPINE FUMARATE 25 MG PO TABS: 25.0000 mg | ORAL_TABLET | Freq: Every day | ORAL | 1 refills | Status: DC

## 2020-08-09 ENCOUNTER — Other Ambulatory Visit: Payer: Self-pay | Admitting: Family Medicine

## 2020-08-09 DIAGNOSIS — G4709 Other insomnia: Secondary | ICD-10-CM

## 2020-09-09 ENCOUNTER — Other Ambulatory Visit: Payer: Self-pay | Admitting: Family Medicine

## 2020-09-09 DIAGNOSIS — F330 Major depressive disorder, recurrent, mild: Secondary | ICD-10-CM

## 2020-09-09 NOTE — Telephone Encounter (Signed)
  Notes to clinic:  The original prescription was discontinued on 06/14/2020 by Alba Cory, MD for the following reason: Dose change. Renewing this prescription may not be appropriate   Requested Prescriptions  Pending Prescriptions Disp Refills   escitalopram (LEXAPRO) 20 MG tablet [Pharmacy Med Name: ESCITALOPRAM 20MG  TABLETS] 90 tablet 0    Sig: TAKE 1 TABLET(20 MG) BY MOUTH DAILY     Psychiatry:  Antidepressants - SSRI Passed - 09/09/2020  7:18 AM      Passed - Completed PHQ-2 or PHQ-9 in the last 360 days      Passed - Valid encounter within last 6 months    Recent Outpatient Visits           2 months ago Mild episode of recurrent major depressive disorder [facility])   [facility], MD   5 months ago Dyslipidemia   [facility], MD   8 months ago Well adult health check   [facility], MD   9 months ago Upper respiratory tract infection, unspecified type   [facility] [place], [place]   10 months ago Moderate episode of recurrent major depressive disorder [facility])   [facility], MD       Future Appointments             In 1 week Alba Cory, MD [facility], [facility]

## 2020-09-09 NOTE — Telephone Encounter (Signed)
Last appt may 23-2022

## 2020-09-16 NOTE — Progress Notes (Signed)
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 Stress level is down even though he has more physical work to do . He has been on  Lexapro 10 mg, Adderal XR helps with focus and his work Copy, seroquel for sleep and all medication still working well for him   GERD: under control with otc medication, no heartburn or regurgitation. He gets otc medication - PPI. Unchanged    Insomnia; taking Seroquel every night, denies side effects. He is able to fall and stay asleep , no side effects   Paresthesia : he states symptoms started about 5 months  ago, he changed jobs about 6 months ago. He states not every morning, it is usually both arms, he states it does not matter the position he is sleeping . It resolves by itself after about 40 minutes, no pain on shoulder and no weakness . Pain is on upper back and occasionally on neck He is willing to see Ortho for further evaluation now    History of back injury: it happened at work a couple of months ago, he had PT and symptoms resolved since Fall of 2020, however he another fall at work in 2021 and hit lower back but symptoms also resolved. He has intermittent soreness on upper back after a long day at work    Pre-diabetes: Denies polyphagia, polyuria or polydipsia. He has lost weight since last visit. 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 He states he has been more active and is also taking Adderal that may be working as an appetite suppressant, we will continue to monitor.   Patient Active Problem List   Diagnosis Date Noted   Ankle instability 12/03/2017   Metabolic syndrome  12/13/2016   Moderate major depression (HCC) 11/30/2016   History of vasculitis of skin 05/20/2015   GERD without esophagitis 11/10/2014   Seasonal allergic rhinitis 11/10/2014   History of nephrolithiasis 11/10/2014   Obesity (BMI 30-39.9) 11/10/2014   Tobacco abuse 11/10/2014   Snoring 11/10/2014   Insomnia 11/10/2014    Past Surgical History:  Procedure Laterality Date   FACIAL RECONSTRUCTION SURGERY  01/13/2008   After a car fell on him while working on it    Family History  Problem Relation Age of Onset   Varicose Veins Mother    Endometriosis Mother    Drug abuse Father    GI Disease Father    Heart attack Maternal Grandmother    Breast cancer Maternal Grandmother    Thyroid cancer Maternal Grandmother    Congestive Heart Failure Maternal Grandfather    Heart attack Maternal Grandfather    Stroke Maternal Grandfather    Breast cancer Paternal Grandmother    Heart attack Paternal Grandfather     Social History   Tobacco Use   Smoking status: Former    Packs/day: 0.25    Years: 15.00    Pack years: 3.75    Types: Cigarettes, E-cigarettes    Start date: 11/10/1999    Quit date: 06/05/2017    Years since quitting: 3.2  Smokeless tobacco: Never   Tobacco comments:    e cig mostly  Substance Use Topics   Alcohol use: No    Alcohol/week: 0.0 standard drinks     Current Outpatient Medications:    amphetamine-dextroamphetamine (ADDERALL XR) 15 MG 24 hr capsule, Take 1 capsule by mouth daily., Disp: 30 capsule, Rfl: 0   amphetamine-dextroamphetamine (ADDERALL XR) 15 MG 24 hr capsule, Take 1 capsule by mouth every morning., Disp: 30 capsule, Rfl: 0   amphetamine-dextroamphetamine (ADDERALL XR) 15 MG 24 hr capsule, Take 1 capsule by mouth every morning., Disp: 30 capsule, Rfl: 0   EPINEPHrine 0.3 mg/0.3 mL IJ SOAJ injection, Inject 0.3 mLs (0.3 mg total) into the muscle as needed for anaphylaxis., Disp: 2 each, Rfl: 0   escitalopram (LEXAPRO) 10 MG tablet, Take 1  tablet (10 mg total) by mouth daily., Disp: 90 tablet, Rfl: 1   omeprazole (PRILOSEC) 20 MG capsule, Take 20 mg by mouth daily., Disp: , Rfl:    QUEtiapine (SEROQUEL) 25 MG tablet, TAKE 1 TABLET(25 MG) BY MOUTH AT BEDTIME, Disp: 90 tablet, Rfl: 0  Allergies  Allergen Reactions   Bee Venom Anaphylaxis, Shortness Of Breath and Swelling   Penicillins Hives    vasculitis    I personally reviewed active problem list, medication list, allergies, family history, social history, health maintenance with the patient/caregiver today.   ROS  Constitutional: Negative for fever , positive for  weight change.  Respiratory: Negative for cough and shortness of breath.   Cardiovascular: Negative for chest pain or palpitations.  Gastrointestinal: Negative for abdominal pain, no bowel changes.  Musculoskeletal: Negative for gait problem or joint swelling.  Skin: Negative for rash.  Neurological: Negative for dizziness or headache.  No other specific complaints in a complete review of systems (except as listed in HPI above).   Objective  Vitals:   09/20/20 1456  BP: 130/74  Pulse: 86  Resp: 16  Temp: 98 F (36.7 C)  SpO2: 98%  Weight: 242 lb (109.8 kg)  Height: 6\' 1"  (1.854 m)    Body mass index is 31.93 kg/m.  Physical Exam  Constitutional: Patient appears well-developed and well-nourished. Obese  No distress.  HEENT: head atraumatic, normocephalic, pupils equal and reactive to light, neck supple Cardiovascular: Normal rate, regular rhythm and normal heart sounds.  No murmur heard. No BLE edema. Pulmonary/Chest: Effort normal and breath sounds normal. No respiratory distress. Abdominal: Soft.  There is no tenderness. Muscular Skeletal: normal upper extremity strength, tender during palpation of left trapezium muscle  Psychiatric: Patient has a normal mood and affect. behavior is normal. Judgment and thought content normal.    PHQ2/9: Depression screen [facility] 2/9 09/20/2020 06/14/2020  03/16/2020 01/13/2020 12/09/2019  Decreased Interest 0 0 0 0 0  Down, Depressed, Hopeless 0 0 2 0 0  PHQ - 2 Score 0 0 2 0 0  Altered sleeping 1 0 0 3 1  Tired, decreased energy 0 0 0 0 0  Change in appetite 0 0 0 0 0  Feeling bad or failure about yourself  0 0 1 0 0  Trouble concentrating 1 0 1 0 0  Moving slowly or fidgety/restless 0 0 0 0 0  Suicidal thoughts 0 0 0 0 0  PHQ-9 Score 2 0 4 3 1   Difficult doing work/chores - - - - -  Some recent data might be hidden    phq 9 is negative   Fall Risk: Fall Risk  09/20/2020 06/14/2020 03/16/2020 01/13/2020 12/09/2019  Falls in the past year? 0 0 0 1 0  Number falls in past yr: 0 0 0 0 0  Injury with Fall? 0 0 0 0 0  Comment - - - - -  Risk for fall due to : No Fall Risks - - - -  Follow up Falls prevention discussed - - - -      Functional Status Survey: Is the patient deaf or have difficulty hearing?: No Does the patient have difficulty seeing, even when wearing glasses/contacts?: No Does the patient have difficulty concentrating, remembering, or making decisions?: No Does the patient have difficulty walking or climbing stairs?: No Does the patient have difficulty dressing or bathing?: No Does the patient have difficulty doing errands alone such as visiting a doctor's office or shopping?: No    Assessment & Plan  1. GERD without esophagitis   2. Major depression in remission (HCC)  - amphetamine-dextroamphetamine (ADDERALL XR) 15 MG 24 hr capsule; Take 1 capsule by mouth daily.  Dispense: 30 capsule; Refill: 0 - amphetamine-dextroamphetamine (ADDERALL XR) 15 MG 24 hr capsule; Take 1 capsule by mouth every morning.  Dispense: 30 capsule; Refill: 0 - amphetamine-dextroamphetamine (ADDERALL XR) 15 MG 24 hr capsule; Take 1 capsule by mouth every morning.  Dispense: 30 capsule; Refill: 0  3. Dyslipidemia   4. Paresthesia  - Ambulatory referral to Orthopedic Surgery  5. Other insomnia   6. Neck pain on left side  -  Ambulatory referral to Orthopedic Surgery  7. Mild episode of recurrent major depressive disorder (HCC)

## 2020-09-20 ENCOUNTER — Encounter: Payer: Self-pay | Admitting: Family Medicine

## 2020-09-20 ENCOUNTER — Other Ambulatory Visit: Payer: Self-pay

## 2020-09-20 ENCOUNTER — Ambulatory Visit: Payer: 59 | Admitting: Family Medicine

## 2020-09-20 VITALS — BP 130/74 | HR 86 | Temp 98.0°F | Resp 16 | Ht 73.0 in | Wt 242.0 lb

## 2020-09-20 DIAGNOSIS — R202 Paresthesia of skin: Secondary | ICD-10-CM

## 2020-09-20 DIAGNOSIS — E785 Hyperlipidemia, unspecified: Secondary | ICD-10-CM | POA: Diagnosis not present

## 2020-09-20 DIAGNOSIS — G4709 Other insomnia: Secondary | ICD-10-CM

## 2020-09-20 DIAGNOSIS — F325 Major depressive disorder, single episode, in full remission: Secondary | ICD-10-CM

## 2020-09-20 DIAGNOSIS — K219 Gastro-esophageal reflux disease without esophagitis: Secondary | ICD-10-CM | POA: Diagnosis not present

## 2020-09-20 DIAGNOSIS — M542 Cervicalgia: Secondary | ICD-10-CM | POA: Diagnosis not present

## 2020-09-20 DIAGNOSIS — F330 Major depressive disorder, recurrent, mild: Secondary | ICD-10-CM

## 2020-09-20 DIAGNOSIS — R69 Illness, unspecified: Secondary | ICD-10-CM | POA: Diagnosis not present

## 2020-09-20 MED ORDER — AMPHETAMINE-DEXTROAMPHET ER 15 MG PO CP24: 15.0000 mg | ORAL_CAPSULE | ORAL | 0 refills | Status: DC

## 2020-09-20 MED ORDER — AMPHETAMINE-DEXTROAMPHET ER 15 MG PO CP24
15.0000 mg | ORAL_CAPSULE | Freq: Every day | ORAL | 0 refills | Status: DC
Start: 2020-09-20 — End: 2021-01-07

## 2020-09-23 DIAGNOSIS — M5412 Radiculopathy, cervical region: Secondary | ICD-10-CM | POA: Diagnosis not present

## 2020-10-06 DIAGNOSIS — G5603 Carpal tunnel syndrome, bilateral upper limbs: Secondary | ICD-10-CM | POA: Diagnosis not present

## 2020-11-02 ENCOUNTER — Other Ambulatory Visit: Payer: Self-pay | Admitting: Family Medicine

## 2020-11-02 DIAGNOSIS — F330 Major depressive disorder, recurrent, mild: Secondary | ICD-10-CM

## 2020-11-14 ENCOUNTER — Other Ambulatory Visit: Payer: Self-pay | Admitting: Family Medicine

## 2020-11-14 DIAGNOSIS — G4709 Other insomnia: Secondary | ICD-10-CM

## 2020-11-22 ENCOUNTER — Other Ambulatory Visit: Payer: Self-pay

## 2020-11-22 ENCOUNTER — Encounter: Payer: Self-pay | Admitting: Emergency Medicine

## 2020-11-22 ENCOUNTER — Ambulatory Visit
Admission: EM | Admit: 2020-11-22 | Discharge: 2020-11-22 | Disposition: A | Discharge status: Home / Self Care | Payer: 59 | Attending: Internal Medicine | Admitting: Internal Medicine

## 2020-11-22 ENCOUNTER — Other Ambulatory Visit: Payer: Self-pay | Admitting: Family Medicine

## 2020-11-22 DIAGNOSIS — R6883 Chills (without fever): Secondary | ICD-10-CM | POA: Insufficient documentation

## 2020-11-22 DIAGNOSIS — Z87891 Personal history of nicotine dependence: Secondary | ICD-10-CM | POA: Diagnosis not present

## 2020-11-22 DIAGNOSIS — Z91030 Bee allergy status: Secondary | ICD-10-CM | POA: Insufficient documentation

## 2020-11-22 DIAGNOSIS — Z880 Allergy status to penicillin: Secondary | ICD-10-CM | POA: Diagnosis not present

## 2020-11-22 DIAGNOSIS — Z79899 Other long term (current) drug therapy: Secondary | ICD-10-CM | POA: Insufficient documentation

## 2020-11-22 DIAGNOSIS — Z20822 Contact with and (suspected) exposure to covid-19: Secondary | ICD-10-CM | POA: Diagnosis not present

## 2020-11-22 DIAGNOSIS — R519 Headache, unspecified: Secondary | ICD-10-CM | POA: Insufficient documentation

## 2020-11-22 DIAGNOSIS — B9789 Other viral agents as the cause of diseases classified elsewhere: Secondary | ICD-10-CM | POA: Insufficient documentation

## 2020-11-22 DIAGNOSIS — J988 Other specified respiratory disorders: Secondary | ICD-10-CM | POA: Insufficient documentation

## 2020-11-22 DIAGNOSIS — R0981 Nasal congestion: Secondary | ICD-10-CM | POA: Insufficient documentation

## 2020-11-22 DIAGNOSIS — F325 Major depressive disorder, single episode, in full remission: Secondary | ICD-10-CM

## 2020-11-22 MED ORDER — IPRATROPIUM BROMIDE 0.03 % NA SOLN: 2.0000 | Freq: Two times a day (BID) | NASAL | 12 refills | Status: DC

## 2020-11-22 MED ORDER — BENZONATATE 100 MG PO CAPS: 100.0000 mg | ORAL_CAPSULE | Freq: Three times a day (TID) | ORAL | 0 refills | Status: DC | PRN

## 2020-11-22 NOTE — ED Triage Notes (Signed)
Pt presents today with c/o of headache and nasal congestion that began x 5 days. Denies fever. Home Covid test negative. He has taken Dayquil and cough drops this morning.

## 2020-11-22 NOTE — Discharge Instructions (Signed)
Maintain adequate hydration Take medications as prescribed We will call you with recommendations if labs are abnormal Return to urgent care if symptoms worsen. 

## 2020-11-22 NOTE — ED Provider Notes (Signed)
[facility]    CSN: [id number] Arrival date & time: 11/22/20  0935      History   Chief Complaint Chief Complaint  Patient presents with   Nasal Congestion   Headache    HPI Timothy Fleming is a 35 y.o. male comes to the urgent care with 5-day history of headache, nasal congestion, postnasal drip and general malaise.  Patient symptoms started 5 days ago and has been persistent.  He denies any fever.  He admits to having some chills.  His cough is productive of yellowish sputum.  No shortness of breath chest tightness or wheezing.  Patient has tried fluticasone nasal spray as well as over-the-counter DayQuil/NyQuil with no significant improvement.  Cough is mild.  No wheezing.  Positive history of sick contacts.  Patient was exposed to a coworker who had COVID-19 sometime last week.  Patient has tested negative with the home COVID test.  No nausea vomiting or diarrhea.  Oral intake is decreased slightly.   HPI  Past Medical History:  Diagnosis Date   Allergy    Seasonal   GERD (gastroesophageal reflux disease)    History of kidney stones    x4   Insomnia     Patient Active Problem List   Diagnosis Date Noted   Ankle instability 12/03/2017   Metabolic syndrome 12/13/2016   Moderate major depression (HCC) 11/30/2016   History of vasculitis of skin 05/20/2015   GERD without esophagitis 11/10/2014   Seasonal allergic rhinitis 11/10/2014   History of nephrolithiasis 11/10/2014   Obesity (BMI 30-39.9) 11/10/2014   Tobacco abuse 11/10/2014   Snoring 11/10/2014   Insomnia 11/10/2014    Past Surgical History:  Procedure Laterality Date   FACIAL RECONSTRUCTION SURGERY  01/13/2008   After a car fell on him while working on it       Home Medications    Prior to Admission medications   Medication Sig Start Date End Date Taking? Authorizing Provider  benzonatate (TESSALON) 100 MG capsule Take 1 capsule (100 mg total) by mouth 3 (three) times daily as needed  for cough. 11/22/20  Yes Bettey Muraoka, Britta Mccreedy, MD  ipratropium (ATROVENT) 0.03 % nasal spray Place 2 sprays into both nostrils every 12 (twelve) hours. 11/22/20  Yes Alicia Seib, Britta Mccreedy, MD  amphetamine-dextroamphetamine (ADDERALL XR) 15 MG 24 hr capsule Take 1 capsule by mouth daily. 09/20/20   Alba Cory, MD  amphetamine-dextroamphetamine (ADDERALL XR) 15 MG 24 hr capsule Take 1 capsule by mouth every morning. 09/20/20   Alba Cory, MD  amphetamine-dextroamphetamine (ADDERALL XR) 15 MG 24 hr capsule Take 1 capsule by mouth every morning. 09/20/20   Carlynn Purl, Danna Hefty, MD  EPINEPHrine 0.3 mg/0.3 mL IJ SOAJ injection Inject 0.3 mLs (0.3 mg total) into the muscle as needed for anaphylaxis. 06/30/19   Alba Cory, MD  escitalopram (LEXAPRO) 10 MG tablet Take 1 tablet (10 mg total) by mouth daily. 06/14/20   Alba Cory, MD  omeprazole (PRILOSEC) 20 MG capsule Take 20 mg by mouth daily.    [provider]  QUEtiapine (SEROQUEL) 25 MG tablet TAKE 1 TABLET(25 MG) BY MOUTH AT BEDTIME 11/14/20   Sowles, Danna Hefty, MD  fluticasone (FLONASE) 50 MCG/ACT nasal spray Place 2 sprays into both nostrils daily. 01/18/19 04/01/19  Rodriguez-Southworth, Nettie Elm, PA-C    Family History Family History  Problem Relation Age of Onset   Varicose Veins Mother    Endometriosis Mother    Drug abuse Father    GI Disease Father  Heart attack Maternal Grandmother    Breast cancer Maternal Grandmother    Thyroid cancer Maternal Grandmother    Congestive Heart Failure Maternal Grandfather    Heart attack Maternal Grandfather    Stroke Maternal Grandfather    Breast cancer Paternal Grandmother    Heart attack Paternal Grandfather     Social History Social History   Tobacco Use   Smoking status: Former    Packs/day: 0.25    Years: 15.00    Pack years: 3.75    Types: Cigarettes, E-cigarettes    Start date: 11/10/1999    Quit date: 06/05/2017    Years since quitting: 3.4   Smokeless tobacco: Never    Tobacco comments:    e cig mostly  Vaping Use   Vaping Use: Every day   Start date: 11/28/2011   Substances: Nicotine, Flavoring   Devices: Box Mod  Substance Use Topics   Alcohol use: No    Alcohol/week: 0.0 standard drinks   Drug use: No     Allergies   Bee venom and Penicillins   Review of Systems Review of Systems As per HPI  Physical Exam Triage Vital Signs ED Triage Vitals  Enc Vitals Group     BP 11/22/20 1147 132/71     Pulse Rate 11/22/20 1147 79     Resp 11/22/20 1147 18     Temp 11/22/20 1147 98.2 F (36.8 C)     Temp Source 11/22/20 1147 Oral     SpO2 11/22/20 1147 98 %     Weight --      Height --      Head Circumference --      Peak Flow --      Pain Score 11/22/20 1146 0     Pain Loc --      Pain Edu? --      Excl. in GC? --    No data found.  Updated Vital Signs BP 132/71 (BP Location: Right Arm)   Pulse 79   Temp 98.2 F (36.8 C) (Oral)   Resp 18   SpO2 98%   Visual Acuity Right Eye Distance:   Left Eye Distance:   Bilateral Distance:    Right Eye Near:   Left Eye Near:    Bilateral Near:     Physical Exam Vitals and nursing note reviewed.  Constitutional:      Appearance: He is ill-appearing.  HENT:     Head: Normocephalic.     Mouth/Throat:     Mouth: Mucous membranes are moist.  Cardiovascular:     Rate and Rhythm: Normal rate and regular rhythm.  Pulmonary:     Effort: Pulmonary effort is normal.     Breath sounds: Normal breath sounds.  Abdominal:     General: Bowel sounds are normal.     Palpations: Abdomen is soft.  Skin:    General: Skin is warm.  Neurological:     Mental Status: He is alert.     GCS: GCS eye subscore is 4. GCS verbal subscore is 5. GCS motor subscore is 6.     UC Treatments / Results  Labs (all labs ordered are listed, but only abnormal results are displayed) Labs Reviewed  SARS CORONAVIRUS 2 (TAT 6-24 HRS)    EKG   Radiology No results found.  Procedures Procedures (including  critical care time)  Medications Ordered in UC Medications - No data to display  Initial Impression / Assessment and Plan / UC Course  I  have reviewed the triage vital signs and the nursing notes.  Pertinent labs & imaging results that were available during my care of the patient were reviewed by me and considered in my medical decision making (see chart for details).     1.  Viral respiratory illness: COVID-19 PCR test has been sent Increase oral fluid intake Tylenol/Motrin as needed for cough We will call you with recommendations if labs are abnormal Work note given Return to urgent care if symptoms worsen. Final Clinical Impressions(s) / UC Diagnoses   Final diagnoses:  Viral respiratory illness     Discharge Instructions      Maintain adequate hydration Take medications as prescribed We will call you with recommendations if labs are abnormal Return to urgent care if symptoms worsen.   ED Prescriptions     Medication Sig Dispense Auth. Provider   ipratropium (ATROVENT) 0.03 % nasal spray Place 2 sprays into both nostrils every 12 (twelve) hours. 30 mL Burnie Therien, Britta Mccreedy, MD   benzonatate (TESSALON) 100 MG capsule Take 1 capsule (100 mg total) by mouth 3 (three) times daily as needed for cough. 21 capsule Melodie Ashworth, Britta Mccreedy, MD      PDMP not reviewed this encounter.   Merrilee Jansky, MD 11/22/20 1236

## 2020-11-22 NOTE — Telephone Encounter (Signed)
Medication Refill - Medication: Adderall  Has the patient contacted their pharmacy? Yes.   Pt was advised to contact PCP. Pt states that he only has one pill left. Please advise.  (Agent: If no, request that the patient contact the pharmacy for the refill. If patient does not wish to contact the pharmacy document the reason why and proceed with request.) (Agent: If yes, when and what did the pharmacy advise?)  Preferred Pharmacy (with phone number or street name):  [facility][id number] [place], [place] - [address] AT [facility] OF [address] [place]  [address] [place] [address]  Phone: [phone] Fax: [phone]  Hours: Not open 24 hours   Has the patient been seen for an appointment in the last year OR does the patient have an upcoming appointment? Yes.    Agent: Please be advised that RX refills may take up to 3 business days. We ask that you follow-up with your pharmacy.

## 2020-11-22 NOTE — Telephone Encounter (Signed)
Requested medications are due for refill today.  yes  Requested medications are on the active medications list.  yes  Last refill. 09/20/2020  Future visit scheduled.   Yes   Notes to clinic.  Medication not delegated. Multiple rx's for this medication.

## 2020-11-23 LAB — SARS CORONAVIRUS 2 (TAT 6-24 HRS): SARS Coronavirus 2: NEGATIVE

## 2020-11-23 NOTE — Telephone Encounter (Signed)
Called they have on fill, please refuse I dont have authorization

## 2020-12-27 ENCOUNTER — Other Ambulatory Visit: Payer: Self-pay | Admitting: Family Medicine

## 2020-12-27 DIAGNOSIS — F325 Major depressive disorder, single episode, in full remission: Secondary | ICD-10-CM

## 2020-12-27 NOTE — Telephone Encounter (Signed)
Requested medications are due for refill today.  yes  Requested medications are on the active medications list.  yes  Last refill. 09/20/2020  Future visit scheduled.   yes  Notes to clinic.  Medication not delegated. Pt has 3 rx's for this medication.

## 2020-12-27 NOTE — Telephone Encounter (Signed)
Medication Refill - Medication:  amphetamine-dextroamphetamine (ADDERALL XR) 15 MG 24 hr capsule  Has the patient contacted their pharmacy? Yes.   Contact PCP  Preferred Pharmacy (with phone number or street name):  [facility] #[id number] [place], [place] - [address] AT [facility] OF [address] [facility]  [address] [place], [place] [address]  Phone:  [phone]  Fax:  [phone]   Has the patient been seen for an appointment in the last year OR does the patient have an upcoming appointment? Yes.    Agent: Please be advised that RX refills may take up to 3 business days. We ask that you follow-up with your pharmacy.

## 2020-12-31 ENCOUNTER — Other Ambulatory Visit: Payer: Self-pay | Admitting: Family Medicine

## 2020-12-31 DIAGNOSIS — F325 Major depressive disorder, single episode, in full remission: Secondary | ICD-10-CM

## 2020-12-31 NOTE — Telephone Encounter (Signed)
Medication Refill - Medication: amphetamine-dextroamphetamine (ADDERALL XR) 15 MG 24 hr capsule  Has the patient contacted their pharmacy? yes (Agent: If no, request that the patient contact the pharmacy for the refill. If patient does not wish to contact the pharmacy document the reason why and proceed with request.) (Agent: If yes, when and what did the pharmacy advise?)contact pcp  Preferred Pharmacy (with phone number or street name): [facility] #[id number] - [place], [place] - [address] AT [facility] OF [address] [facility]  Phone:  [phone] Fax:  [phone] Has the patient been seen for an appointment in the last year OR does the patient have an upcoming appointment? yes Agent: Please be advised that RX refills may take up to 3 business days. We ask that you follow-up with your pharmacy.

## 2020-12-31 NOTE — Telephone Encounter (Signed)
Requested medication (s) are due for refill today: yes  Requested medication (s) are on the active medication list: yes  Last refill:  09/20/20 #30/0RF  Future visit scheduled: yes  Notes to clinic:  Unable to refill per protocol, cannot delegate. Unable to refill per protocol due to failed labs, no updated results.      Requested Prescriptions  Pending Prescriptions Disp Refills   amphetamine-dextroamphetamine (ADDERALL XR) 15 MG 24 hr capsule 30 capsule 0    Sig: Take 1 capsule by mouth every morning.     Not Delegated - Psychiatry:  Stimulants/ADHD Failed - 12/31/2020  3:36 PM      Failed - This refill cannot be delegated      Failed - Urine Drug Screen completed in last 360 days      Failed - Valid encounter within last 3 months    Recent Outpatient Visits           3 months ago GERD without esophagitis   [facility] Alba Cory, MD   6 months ago Mild episode of recurrent major depressive disorder [facility])   [facility] Alba Cory, MD   9 months ago Dyslipidemia   [facility] Alba Cory, MD   11 months ago Well adult health check   [facility] Alba Cory, MD   1 year ago Upper respiratory tract infection, unspecified type   [facility] Alba Cory, MD       Future Appointments             In 2 months Carlynn Purl, Danna Hefty, MD [facility]

## 2021-01-04 NOTE — Telephone Encounter (Signed)
Pt calling back to follow up on refill. Pt is completely out of this medication.   Please advise.

## 2021-01-05 ENCOUNTER — Other Ambulatory Visit: Payer: Self-pay

## 2021-01-05 DIAGNOSIS — F325 Major depressive disorder, single episode, in full remission: Secondary | ICD-10-CM

## 2021-01-05 MED ORDER — AMPHETAMINE-DEXTROAMPHET ER 15 MG PO CP24: 15.0000 mg | ORAL_CAPSULE | ORAL | 0 refills | Status: DC

## 2021-01-05 NOTE — Telephone Encounter (Signed)
Appt on 01/07/21 pt is completely out

## 2021-01-05 NOTE — Telephone Encounter (Signed)
Pt is completely out of his meds and has an appt on Friday 01/07/21

## 2021-01-07 ENCOUNTER — Ambulatory Visit: Payer: 59 | Admitting: Family Medicine

## 2021-01-07 ENCOUNTER — Other Ambulatory Visit: Payer: Self-pay | Admitting: Family Medicine

## 2021-01-07 ENCOUNTER — Encounter: Payer: Self-pay | Admitting: Family Medicine

## 2021-01-07 VITALS — BP 124/72 | HR 98 | Temp 97.8°F | Resp 16 | Ht 74.0 in | Wt 242.9 lb

## 2021-01-07 DIAGNOSIS — E669 Obesity, unspecified: Secondary | ICD-10-CM

## 2021-01-07 DIAGNOSIS — R69 Illness, unspecified: Secondary | ICD-10-CM | POA: Diagnosis not present

## 2021-01-07 DIAGNOSIS — Z23 Encounter for immunization: Secondary | ICD-10-CM | POA: Diagnosis not present

## 2021-01-07 DIAGNOSIS — F330 Major depressive disorder, recurrent, mild: Secondary | ICD-10-CM | POA: Diagnosis not present

## 2021-01-07 DIAGNOSIS — G4709 Other insomnia: Secondary | ICD-10-CM

## 2021-01-07 DIAGNOSIS — E8881 Metabolic syndrome: Secondary | ICD-10-CM | POA: Diagnosis not present

## 2021-01-07 DIAGNOSIS — K219 Gastro-esophageal reflux disease without esophagitis: Secondary | ICD-10-CM | POA: Diagnosis not present

## 2021-01-07 DIAGNOSIS — J301 Allergic rhinitis due to pollen: Secondary | ICD-10-CM

## 2021-01-07 DIAGNOSIS — E785 Hyperlipidemia, unspecified: Secondary | ICD-10-CM

## 2021-01-07 DIAGNOSIS — F325 Major depressive disorder, single episode, in full remission: Secondary | ICD-10-CM

## 2021-01-07 MED ORDER — AMPHETAMINE-DEXTROAMPHET ER 15 MG PO CP24: 15.0000 mg | ORAL_CAPSULE | ORAL | 0 refills | Status: DC

## 2021-01-07 MED ORDER — ESCITALOPRAM OXALATE 10 MG PO TABS: 10.0000 mg | ORAL_TABLET | Freq: Every day | ORAL | 1 refills | Status: DC

## 2021-01-07 MED ORDER — QUETIAPINE FUMARATE 25 MG PO TABS: ORAL_TABLET | ORAL | 1 refills | Status: DC

## 2021-01-07 MED ORDER — FLUTICASONE PROPIONATE 50 MCG/ACT NA SUSP: 2.0000 | Freq: Every day | NASAL | 0 refills | Status: DC

## 2021-01-07 MED ORDER — AMPHETAMINE-DEXTROAMPHET ER 15 MG PO CP24: 15.0000 mg | ORAL_CAPSULE | Freq: Every day | ORAL | 0 refills | Status: DC

## 2021-01-07 MED ORDER — EPINEPHRINE 0.3 MG/0.3ML IJ SOAJ: 0.3000 mg | INTRAMUSCULAR | 0 refills | Status: AC | PRN

## 2021-01-07 NOTE — Progress Notes (Signed)
Name: Timothy Fleming   MRN: [id number]    DOB: 14-Nov-1985   Date:01/07/2021       Progress Note  Subjective  Chief Complaint  Chief Complaint  Patient presents with   Medication Refill    HPI  Depression Major:he is very happy at this time, he ran out of Adderal and felt very tired at the end of the day and lack of motivation, taking lexapro as prescribed. 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 In remission    GERD: under control with otc medication, no heartburn or regurgitation. He gets otc medication - PPI. Discussed alternating Pepcid and PPI    Insomnia; taking Seroquel every night, denies side effects. He is able to fall and stay asleep with medication. Denies side effects of medication    Paresthesia : symptoms started earlier this year, saw Ortho and had EMG, diagnosed with carpal tunnel syndrome that is worse on left hand, he was advised to have MRI but he decided not to have it done since EMG showed carpal tunnel and symptoms are mild and intermittent    Pre-diabetes: Denies polyphagia, polyuria, but he feels thirsty at work. He has lost weight since last visit. We will recheck labs during CPE that was moved to 02/23    Weight loss: he lost some weight when he first started Adderal, stopped sodas, more active but weigh is  stable now    Patient Active Problem List   Diagnosis Date Noted   Ankle instability 12/03/2017   Metabolic syndrome 12/13/2016   Moderate major depression (HCC) 11/30/2016   History of vasculitis of skin 05/20/2015   GERD without esophagitis 11/10/2014   Seasonal allergic rhinitis 11/10/2014   History of nephrolithiasis 11/10/2014   Obesity (BMI 30-39.9) 11/10/2014   Tobacco abuse 11/10/2014   Snoring 11/10/2014   Insomnia 11/10/2014    Past Surgical History:  Procedure Laterality Date   FACIAL RECONSTRUCTION SURGERY  01/13/2008   After a car fell on him while working on  it    Family History  Problem Relation Age of Onset   Varicose Veins Mother    Endometriosis Mother    Drug abuse Father    GI Disease Father    Heart attack Maternal Grandmother    Breast cancer Maternal Grandmother    Thyroid cancer Maternal Grandmother    Congestive Heart Failure Maternal Grandfather    Heart attack Maternal Grandfather    Stroke Maternal Grandfather    Breast cancer Paternal Grandmother    Heart attack Paternal Grandfather     Social History   Tobacco Use   Smoking status: Former    Packs/day: 0.25    Years: 15.00    Pack years: 3.75    Types: Cigarettes, E-cigarettes    Start date: 11/10/1999    Quit date: 06/05/2017    Years since quitting: 3.5   Smokeless tobacco: Never   Tobacco comments:    e cig mostly  Substance Use Topics   Alcohol use: No    Alcohol/week: 0.0 standard drinks     Current Outpatient Medications:    amphetamine-dextroamphetamine (ADDERALL XR) 15 MG 24 hr capsule, Take 1 capsule by mouth daily., Disp: 30 capsule, Rfl: 0   amphetamine-dextroamphetamine (ADDERALL XR) 15 MG 24 hr capsule, Take 1 capsule by mouth every morning., Disp: 30 capsule, Rfl: 0   amphetamine-dextroamphetamine (ADDERALL XR) 15 MG 24 hr capsule, Take 1 capsule by  mouth every morning., Disp: 2 capsule, Rfl: 0   EPINEPHrine 0.3 mg/0.3 mL IJ SOAJ injection, Inject 0.3 mLs (0.3 mg total) into the muscle as needed for anaphylaxis., Disp: 2 each, Rfl: 0   escitalopram (LEXAPRO) 10 MG tablet, Take 1 tablet (10 mg total) by mouth daily., Disp: 90 tablet, Rfl: 1   omeprazole (PRILOSEC) 20 MG capsule, Take 20 mg by mouth daily., Disp: , Rfl:    QUEtiapine (SEROQUEL) 25 MG tablet, TAKE 1 TABLET(25 MG) BY MOUTH AT BEDTIME, Disp: 90 tablet, Rfl: 0   ipratropium (ATROVENT) 0.03 % nasal spray, Place 2 sprays into both nostrils every 12 (twelve) hours. (Patient not taking: Reported on 01/07/2021), Disp: 30 mL, Rfl: 12  Allergies  Allergen Reactions   Bee Venom  Anaphylaxis, Shortness Of Breath and Swelling   Penicillins Hives    vasculitis    I personally reviewed active problem list, medication list, allergies, family history, social history with the patient/caregiver today.   ROS  Constitutional: Negative for fever or weight change.  Respiratory: Negative for cough and shortness of breath.   Cardiovascular: Negative for chest pain or palpitations.  Gastrointestinal: Negative for abdominal pain, no bowel changes.  Musculoskeletal: Negative for gait problem or joint swelling.  Skin: Negative for rash.  Neurological: Negative for dizziness or headache.  No other specific complaints in a complete review of systems (except as listed in HPI above).   Objective  Vitals:   01/07/21 1329  BP: 124/72  Pulse: 98  Resp: 16  Temp: 97.8 F (36.6 C)  TempSrc: Oral  SpO2: 98%  Weight: 242 lb 14.4 oz (110.2 kg)  Height: 6\' 2"  (1.88 m)    Body mass index is 31.19 kg/m.  Physical Exam  Constitutional: Patient appears well-developed and well-nourished. Obese  No distress.  HEENT: head atraumatic, normocephalic, pupils equal and reactive to light, neck supple Cardiovascular: Normal rate, regular rhythm and normal heart sounds.  No murmur heard. No BLE edema. Pulmonary/Chest: Effort normal and breath sounds normal. No respiratory distress. Abdominal: Soft.  There is no tenderness. Psychiatric: Patient has a normal mood and affect. behavior is normal. Judgment and thought content normal.   Recent Results (from the past 2160 hour(s))  SARS CORONAVIRUS 2 (TAT 6-24 HRS) Nasopharyngeal Nasopharyngeal Swab     Status: None   Collection Time: 11/22/20 11:51 AM   Specimen: Nasopharyngeal Swab  Result Value Ref Range   SARS Coronavirus 2 NEGATIVE NEGATIVE    Comment: (NOTE) SARS-CoV-2 target nucleic acids are NOT DETECTED.  The SARS-CoV-2 RNA is generally detectable in upper and lower respiratory specimens during the acute phase of infection.  Negative results do not preclude SARS-CoV-2 infection, do not rule out co-infections with other pathogens, and should not be used as the sole basis for treatment or other patient management decisions. Negative results must be combined with clinical observations, patient history, and epidemiological information. The expected result is Negative.  Fact Sheet for Patients: 11/24/20  Fact Sheet for Healthcare Providers: HairSlick.no  This test is not yet approved or cleared by the quierodirigir.com FDA and  has been authorized for detection and/or diagnosis of SARS-CoV-2 by FDA under an Emergency Use Authorization (EUA). This EUA will remain  in effect (meaning this test can be used) for the duration of the COVID-19 declaration under Se ction 564(b)(1) of the Act, 21 U.S.C. section 360bbb-3(b)(1), unless the authorization is terminated or revoked sooner.  Performed at [facility], [address] [place], [place]  PHQ2/9: Depression screen [facility] 2/9 01/07/2021 09/20/2020 06/14/2020 03/16/2020 01/13/2020  Decreased Interest 0 0 0 0 0  Down, Depressed, Hopeless 0 0 0 2 0  PHQ - 2 Score 0 0 0 2 0  Altered sleeping 0 1 0 0 3  Tired, decreased energy 0 0 0 0 0  Change in appetite 0 0 0 0 0  Feeling bad or failure about yourself  0 0 0 1 0  Trouble concentrating 0 1 0 1 0  Moving slowly or fidgety/restless 0 0 0 0 0  Suicidal thoughts 0 0 0 0 0  PHQ-9 Score 0 2 0 4 3  Difficult doing work/chores Not difficult at all - - - -  Some recent data might be hidden    phq 9 is negative   Fall Risk: Fall Risk  01/07/2021 09/20/2020 06/14/2020 03/16/2020 01/13/2020  Falls in the past year? 1 0 0 0 1  Number falls in past yr: 0 0 0 0 0  Injury with Fall? 0 0 0 0 0  Comment - - - - -  Risk for fall due to : Impaired balance/gait No Fall Risks - - -  Follow up Falls prevention discussed Falls prevention discussed - -  -    Functional Status Survey: Is the patient deaf or have difficulty hearing?: No Does the patient have difficulty seeing, even when wearing glasses/contacts?: No Does the patient have difficulty concentrating, remembering, or making decisions?: No Does the patient have difficulty walking or climbing stairs?: No Does the patient have difficulty dressing or bathing?: No Does the patient have difficulty doing errands alone such as visiting a doctor's office or shopping?: No    Assessment & Plan  1. Need for influenza vaccination  - Flu Vaccine QUAD 6+ mos PF IM (Fluarix Quad PF)  2. Major depression in remission (HCC)  - amphetamine-dextroamphetamine (ADDERALL XR) 15 MG 24 hr capsule; Take 1 capsule by mouth daily.  Dispense: 30 capsule; Refill: 0 - amphetamine-dextroamphetamine (ADDERALL XR) 15 MG 24 hr capsule; Take 1 capsule by mouth every morning.  Dispense: 30 capsule; Refill: 0 - amphetamine-dextroamphetamine (ADDERALL XR) 15 MG 24 hr capsule; Take 1 capsule by mouth every morning.  Dispense: 30 capsule; Refill: 0 - escitalopram (LEXAPRO) 10 MG tablet; Take 1 tablet (10 mg total) by mouth daily.  Dispense: 90 tablet; Refill: 1  3. Mild episode of recurrent major depressive disorder (HCC)  - escitalopram (LEXAPRO) 10 MG tablet; Take 1 tablet (10 mg total) by mouth daily.  Dispense: 90 tablet; Refill: 1  4. Other insomnia  - QUEtiapine (SEROQUEL) 25 MG tablet; TAKE 1 TABLET(25 MG) BY MOUTH AT BEDTIME  Dispense: 90 tablet; Refill: 1  5. Dyslipidemia   6. GERD without esophagitis  stable  7. Metabolic syndrome  Recheck labs next visit   8. Obesity (BMI 30-39.9)  Discussed with the patient the risk posed by an increased BMI. Discussed importance of portion control, calorie counting and at least 150 minutes of physical activity weekly. Avoid sweet beverages and drink more water. Eat at least 6 servings of fruit and vegetables daily    9. Seasonal allergic rhinitis due  to pollen  - fluticasone (FLONASE) 50 MCG/ACT nasal spray; Place 2 sprays into both nostrils daily.  Dispense: 18.2 g; Refill: 0

## 2021-02-05 ENCOUNTER — Other Ambulatory Visit: Payer: Self-pay | Admitting: Family Medicine

## 2021-02-05 DIAGNOSIS — J301 Allergic rhinitis due to pollen: Secondary | ICD-10-CM

## 2021-02-05 NOTE — Telephone Encounter (Signed)
Requested Prescriptions  Pending Prescriptions Disp Refills   fluticasone (FLONASE) 50 MCG/ACT nasal spray [Pharmacy Med Name: FLUTICASONE NASAL SP (120) RX] 16 g 0    Sig: SHAKE LIQUID AND USE 2 SPRAYS IN EACH NOSTRIL DAILY     Ear, Nose, and Throat: Nasal Preparations - Corticosteroids Passed - 02/05/2021  3:15 AM      Passed - Valid encounter within last 12 months    Recent Outpatient Visits          4 weeks ago Major depression in remission [facility])   [facility] Alba Cory, MD   4 months ago GERD without esophagitis   [facility], Danna Hefty, MD   7 months ago Mild episode of recurrent major depressive disorder [facility])   [facility] Alba Cory, MD   10 months ago Dyslipidemia   [facility] Alba Cory, MD   1 year ago Well adult health check   [facility] Alba Cory, MD      Future Appointments            In 1 month Alba Cory, MD [facility], [facility]   In 1 month Alba Cory, MD [facility], [facility]   In 4 months Alba Cory, MD [facility], [facility]

## 2021-03-04 ENCOUNTER — Other Ambulatory Visit: Payer: Self-pay | Admitting: Family Medicine

## 2021-03-04 DIAGNOSIS — J301 Allergic rhinitis due to pollen: Secondary | ICD-10-CM

## 2021-03-04 NOTE — Telephone Encounter (Signed)
Requested medications are due for refill today.  yes  Requested medications are on the active medications list.  yes  Last refill. 02/05/2021  Future visit scheduled.   yes  Notes to clinic.  Medication not delegated.    Requested Prescriptions  Pending Prescriptions Disp Refills   fluticasone (FLONASE) 50 MCG/ACT nasal spray [Pharmacy Med Name: FLUTICASONE NASAL SP (120) RX] 16 g 0    Sig: SHAKE LIQUID AND USE 2 SPRAYS IN EACH NOSTRIL DAILY     Not Delegated - Ear, Nose, and Throat: Nasal Preparations - Corticosteroids Failed - 03/04/2021  3:17 AM      Failed - This refill cannot be delegated      Passed - Valid encounter within last 12 months    Recent Outpatient Visits           1 month ago Major depression in remission [facility])   [facility] Alba Cory, MD   5 months ago GERD without esophagitis   [facility] [place], Danna Hefty, MD   8 months ago Mild episode of recurrent major depressive disorder [facility])   [facility] Alba Cory, MD   11 months ago Dyslipidemia   [facility] Alba Cory, MD   1 year ago Well adult health check   [facility] Alba Cory, MD       Future Appointments             In 2 weeks Alba Cory, MD [facility]   In 4 weeks Alba Cory, MD [facility]   In 3 months Alba Cory, MD [facility]

## 2021-03-21 NOTE — Progress Notes (Signed)
Name: Timothy Fleming   MRN: [id number]    DOB: 10-07-1985   Date:03/22/2021       Progress Note  Subjective  Chief Complaint  Annual Exam  HPI  Patient presents for annual CPE.  Diet: eats out for lunch but cooks dinner at home  Exercise: discussed 150 minutes per week   Depression: phq 9 is negative Depression screen [facility] 2/9 03/22/2021 01/07/2021 09/20/2020 06/14/2020 03/16/2020  Decreased Interest 0 0 0 0 0  Down, Depressed, Hopeless 0 0 0 0 2  PHQ - 2 Score 0 0 0 0 2  Altered sleeping 0 0 1 0 0  Tired, decreased energy 0 0 0 0 0  Change in appetite 0 0 0 0 0  Feeling bad or failure about yourself  0 0 0 0 1  Trouble concentrating 0 0 1 0 1  Moving slowly or fidgety/restless 0 0 0 0 0  Suicidal thoughts 0 0 0 0 0  PHQ-9 Score 0 0 2 0 4  Difficult doing work/chores Not difficult at all Not difficult at all - - -  Some recent data might be hidden    Hypertension:  BP Readings from Last 3 Encounters:  03/22/21 106/62  01/07/21 124/72  11/22/20 132/71    Obesity: Wt Readings from Last 3 Encounters:  03/22/21 242 lb (109.8 kg)  01/07/21 242 lb 14.4 oz (110.2 kg)  09/20/20 242 lb (109.8 kg)   BMI Readings from Last 3 Encounters:  03/22/21 31.93 kg/m  01/07/21 31.19 kg/m  09/20/20 31.93 kg/m     Lipids:  Lab Results  Component Value Date   CHOL 215 (H) 01/14/2020   CHOL 196 12/05/2018   CHOL 197 11/27/2017   Lab Results  Component Value Date   HDL 40 01/14/2020   HDL 32 (L) 12/05/2018   HDL 37 (L) 11/27/2017   Lab Results  Component Value Date   LDLCALC 131 (H) 01/14/2020   LDLCALC 113 (H) 12/05/2018   LDLCALC 128 (H) 11/27/2017   Lab Results  Component Value Date   TRIG 310 (H) 01/14/2020   TRIG 360 (H) 12/05/2018   TRIG 182 (H) 11/27/2017   Lab Results  Component Value Date   CHOLHDL 5.4 (H) 01/14/2020   CHOLHDL 6.1 (H) 12/05/2018   CHOLHDL 5.3 (H) 11/27/2017   No results found for: LDLDIRECT Glucose:  Glucose  Date Value Ref Range  Status  11/18/2012 106 (H) 65 - 99 mg/dL Final  04/16/2012 93 65 - 99 mg/dL Final  04/06/2012 130 (H) 65 - 99 mg/dL Final   Glucose, Bld  Date Value Ref Range Status  01/14/2020 105 (H) 65 - 99 mg/dL Final    Comment:    .            Fasting reference interval . For someone without known diabetes, a glucose value between 100 and 125 mg/dL is consistent with prediabetes and should be confirmed with a follow-up test. .   12/05/2018 103 (H) 65 - 99 mg/dL Final    Comment:    .            Fasting reference interval . For someone without known diabetes, a glucose value between 100 and 125 mg/dL is consistent with prediabetes and should be confirmed with a follow-up test. .   10/28/2018 113 (H) 70 - 99 mg/dL Final    [place] Office Visit from 03/22/2021 in [facility]  AUDIT-C Score 0  Married STD testing and prevention (HIV/chl/gon/syphilis):  01/14/20 Hep C Screening: 12/05/18 Skin cancer: Discussed monitoring for atypical lesions Colorectal cancer: N/A Prostate cancer:  not applicable No results found for: PSA   Lung cancer:  Low Dose CT Chest recommended if Age 68-80 years, 30 pack-year currently smoking OR have quit w/in 15years. Patient  not applicable AAA: The USPSTF recommends one-time screening with ultrasonography in men ages 35 to 65 years who have ever smoked. Patient:  not applicable ECG:  [id number]  Vaccines:   HPV: not interested  Tdap: up to date 05/28/2018  Shingrix: N/A Pneumonia: N/A Flu: up to date 12/22  COVID-19: Never- refused   Advanced Care Planning: A voluntary discussion about advance care planning including the explanation and discussion of advance directives.  Discussed health care proxy and Living will, and the patient was able to identify a health care proxy as wife .  Patient does not LW or power of attorney of health care   Patient Active Problem List   Diagnosis Date Noted   Ankle instability  99991111   Metabolic syndrome [id number]   Moderate major depression ([place]) 11/30/2016   History of vasculitis of skin 05/20/2015   GERD without esophagitis 11/10/2014   Seasonal allergic rhinitis 11/10/2014   History of nephrolithiasis 11/10/2014   Obesity (BMI 30-39.9) 11/10/2014   Tobacco abuse 11/10/2014   Snoring 11/10/2014   Insomnia 11/10/2014    Past Surgical History:  Procedure Laterality Date   FACIAL RECONSTRUCTION SURGERY  01/13/2008   After a car fell on him while working on it    Family History  Problem Relation Age of Onset   Varicose Veins Mother    Endometriosis Mother    Drug abuse Father    GI Disease Father    Heart attack Maternal Grandmother    Breast cancer Maternal Grandmother    Thyroid cancer Maternal Grandmother    Congestive Heart Failure Maternal Grandfather    Heart attack Maternal Grandfather    Stroke Maternal Grandfather    Breast cancer Paternal Grandmother    Heart attack Paternal Grandfather     Social History   Socioeconomic History   Marital status: Married    Spouse name: Joelene Millin    Number of children: 1   Years of education: Not on file   Highest education level: Some college, no degree  Occupational History   Not on file  Tobacco Use   Smoking status: Former    Packs/day: 0.25    Years: 15.00    Pack years: 3.75    Types: Cigarettes, E-cigarettes    Start date: 11/10/1999    Quit date: 06/05/2017    Years since quitting: 3.7   Smokeless tobacco: Never   Tobacco comments:    e cig mostly  Vaping Use   Vaping Use: Every day   Start date: 11/28/2011   Substances: Nicotine, Flavoring   Devices: Box Mod  Substance and Sexual Activity   Alcohol use: No    Alcohol/week: 0.0 standard drinks   Drug use: No   Sexual activity: Yes    Partners: Female  Other Topics Concern   Not on file  Social History Narrative   He is married has one son.    He was addicted to video games, but is back playing guitar    Social  Determinants of Health   Financial Resource Strain: Low Risk    Difficulty of Paying Living Expenses: Not hard at all  Food Insecurity: No Food Insecurity  Worried About Charity fundraiser in the Last Year: Never true   [place] in the Last Year: Never true  Transportation Needs: No Transportation Needs   Lack of Transportation (Medical): No   Lack of Transportation (Non-Medical): No  Physical Activity: Insufficiently Active   Days of Exercise per Week: 2 days   Minutes of Exercise per Session: 60 min  Stress: Stress Concern Present   Feeling of Stress : Very much  Social Connections: Moderately Integrated   Frequency of Communication with Friends and Family: Three times a week   Frequency of Social Gatherings with Friends and Family: Once a week   Attends Religious Services: Never   Marine scientist or Organizations: Yes   Attends Music therapist: More than 4 times per year   Marital Status: Married  Human resources officer Violence: Not At Risk   Fear of Current or Ex-Partner: No   Emotionally Abused: No   Physically Abused: No   Sexually Abused: No     Current Outpatient Medications:    amphetamine-dextroamphetamine (ADDERALL XR) 15 MG 24 hr capsule, Take 1 capsule by mouth daily., Disp: 30 capsule, Rfl: 0   amphetamine-dextroamphetamine (ADDERALL XR) 15 MG 24 hr capsule, Take 1 capsule by mouth every morning., Disp: 30 capsule, Rfl: 0   amphetamine-dextroamphetamine (ADDERALL XR) 15 MG 24 hr capsule, Take 1 capsule by mouth every morning., Disp: 30 capsule, Rfl: 0   EPINEPHrine 0.3 mg/0.3 mL IJ SOAJ injection, Inject 0.3 mg into the muscle as needed for anaphylaxis., Disp: 2 each, Rfl: 0   escitalopram (LEXAPRO) 10 MG tablet, Take 1 tablet (10 mg total) by mouth daily., Disp: 90 tablet, Rfl: 1   fluticasone (FLONASE) 50 MCG/ACT nasal spray, SHAKE LIQUID AND USE 2 SPRAYS IN EACH NOSTRIL DAILY, Disp: 16 g, Rfl: 0   omeprazole (PRILOSEC) 20 MG capsule, Take  20 mg by mouth daily., Disp: , Rfl:    QUEtiapine (SEROQUEL) 25 MG tablet, TAKE 1 TABLET(25 MG) BY MOUTH AT BEDTIME, Disp: 90 tablet, Rfl: 1   ipratropium (ATROVENT) 0.03 % nasal spray, Place 2 sprays into both nostrils every 12 (twelve) hours. (Patient not taking: Reported on 03/22/2021), Disp: 30 mL, Rfl: 12  Allergies  Allergen Reactions   Bee Venom Anaphylaxis, Shortness Of Breath and Swelling   Penicillins Hives    vasculitis     ROS  Constitutional: Negative for fever or weight change.  Respiratory: Negative for cough and shortness of breath.   Cardiovascular: Negative for chest pain or palpitations.  Gastrointestinal: Negative for abdominal pain, no bowel changes.  Musculoskeletal: Negative for gait problem or joint swelling.  Skin: Negative for rash.  Neurological: Negative for dizziness or headache.  No other specific complaints in a complete review of systems (except as listed in HPI above).    Objective  Vitals:   03/22/21 1347  BP: 106/62  Pulse: 80  Resp: 16  Temp: 97.8 F (36.6 C)  TempSrc: Oral  SpO2: 98%  Weight: 242 lb (109.8 kg)  Height: 6\' 1"  (1.854 m)    Body mass index is 31.93 kg/m.  Physical Exam  Constitutional: Patient appears well-developed and well-nourished. No distress.  HENT: Head: Normocephalic and atraumatic. Ears: B TMs ok, no erythema or effusion; Nose: Not done  Mouth/Throat: not done  Eyes: Conjunctivae and EOM are normal. Pupils are equal, round, and reactive to light. No scleral icterus.  Neck: Normal range of motion. Neck supple. No JVD present. No thyromegaly present.  Cardiovascular: Normal rate, regular rhythm and normal heart sounds.  No murmur heard. No BLE edema. Pulmonary/Chest: Effort normal and breath sounds normal. No respiratory distress. Abdominal: Soft. Bowel sounds are normal, no distension. There is no tenderness. no masses MALE GENITALIA: Normal descended testes bilaterally, no masses palpated, no hernias, no  lesions, no discharge RECTAL: not done  Musculoskeletal: Normal range of motion, no joint effusions. No gross deformities Neurological: he is alert and oriented to person, place, and time. No cranial nerve deficit. Coordination, balance, strength, speech and gait are normal.  Skin: Skin is warm and dry. No rash noted. No erythema.  Psychiatric: Patient has a normal mood and affect. behavior is normal. Judgment and thought content normal.   Fall Risk: Fall Risk  03/22/2021 01/07/2021 09/20/2020 06/14/2020 03/16/2020  Falls in the past year? 0 1 0 0 0  Number falls in past yr: 0 0 0 0 0  Injury with Fall? 0 0 0 0 0  Comment - - - - -  Risk for fall due to : No Fall Risks Impaired balance/gait No Fall Risks - -  Follow up Falls prevention discussed Falls prevention discussed Falls prevention discussed - -     Functional Status Survey: Is the patient deaf or have difficulty hearing?: No Does the patient have difficulty seeing, even when wearing glasses/contacts?: No Does the patient have difficulty concentrating, remembering, or making decisions?: Yes Does the patient have difficulty walking or climbing stairs?: No Does the patient have difficulty dressing or bathing?: No Does the patient have difficulty doing errands alone such as visiting a doctor's office or shopping?: No    Assessment & Plan  1. Well adult exam  - Lipid panel - CBC with Differential/Platelet - COMPLETE METABOLIC PANEL WITH GFR - Hemoglobin A1c  2. Metabolic syndrome  - Hemoglobin A1c  3. Dyslipidemia  - Lipid panel  4. Long-term use of high-risk medication  - CBC with Differential/Platelet - COMPLETE METABOLIC PANEL WITH GFR    -Prostate cancer screening and PSA options (with potential risks and benefits of testing vs not testing) were discussed along with recent recs/guidelines. -USPSTF grade A and B recommendations reviewed with patient; age-appropriate recommendations, preventive care, screening  tests, etc discussed and encouraged; healthy living encouraged; see AVS for patient education given to patient -Discussed importance of 150 minutes of physical activity weekly, eat two servings of fish weekly, eat one serving of tree nuts ( cashews, pistachios, pecans, almonds.Marland Kitchen) every other day, eat 6 servings of fruit/vegetables daily and drink plenty of water and avoid sweet beverages.  -Reviewed Health Maintenance: yes

## 2021-03-21 NOTE — Patient Instructions (Signed)
Preventive Care 21-37 Years Old, Male ?Preventive care refers to lifestyle choices and visits with your health care provider that can promote health and wellness. Preventive care visits are also called wellness exams. ?What can I expect for my preventive care visit? ?Counseling ?During your preventive care visit, your health care provider may ask about your: ?Medical history, including: ?Past medical problems. ?Family medical history. ?Current health, including: ?Emotional well-being. ?Home life and relationship well-being. ?Sexual activity. ?Lifestyle, including: ?Alcohol, nicotine or tobacco, and drug use. ?Access to firearms. ?Diet, exercise, and sleep habits. ?Safety issues such as seatbelt and bike helmet use. ?Sunscreen use. ?Work and work environment. ?Physical exam ?Your health care provider may check your: ?Height and weight. These may be used to calculate your BMI (body mass index). BMI is a measurement that tells if you are at a healthy weight. ?Waist circumference. This measures the distance around your waistline. This measurement also tells if you are at a healthy weight and may help predict your risk of certain diseases, such as type 2 diabetes and high blood pressure. ?Heart rate and blood pressure. ?Body temperature. ?Skin for abnormal spots. ?What immunizations do I need? ?Vaccines are usually given at various ages, according to a schedule. Your health care provider will recommend vaccines for you based on your age, medical history, and lifestyle or other factors, such as travel or where you work. ?What tests do I need? ?Screening ?Your health care provider may recommend screening tests for certain conditions. This may include: ?Lipid and cholesterol levels. ?Diabetes screening. This is done by checking your blood sugar (glucose) after you have not eaten for a while (fasting). ?Hepatitis B test. ?Hepatitis C test. ?HIV (human immunodeficiency virus) test. ?STI (sexually transmitted infection)  testing, if you are at risk. ?Talk with your health care provider about your test results, treatment options, and if necessary, the need for more tests. ?Follow these instructions at home: ?Eating and drinking ? ?Eat a healthy diet that includes fresh fruits and vegetables, whole grains, lean protein, and low-fat dairy products. ?Drink enough fluid to keep your urine pale yellow. ?Take vitamin and mineral supplements as recommended by your health care provider. ?Do not drink alcohol if your health care provider tells you not to drink. ?If you drink alcohol: ?Limit how much you have to 0-2 drinks a day. ?Know how much alcohol is in your drink. In the [place] one drink equals one 12 oz bottle of beer (355 mL), one 5 oz glass of wine (148 mL), or one 1? oz glass of hard liquor (44 mL). ?Lifestyle ?Brush your teeth every morning and night with fluoride toothpaste. Floss one time each day. ?Exercise for at least 30 minutes 5 or more days each week. ?Do not use any products that contain nicotine or tobacco. These products include cigarettes, chewing tobacco, and vaping devices, such as e-cigarettes. If you need help quitting, ask your health care provider. ?Do not use drugs. ?If you are sexually active, practice safe sex. Use a condom or other form of protection to prevent STIs. ?Find healthy ways to manage stress, such as: ?Meditation, yoga, or listening to music. ?Journaling. ?Talking to a trusted person. ?Spending time with friends and family. ?Minimize exposure to UV radiation to reduce your risk of skin cancer. ?Safety ?Always wear your seat belt while driving or riding in a vehicle. ?Do not drive: ?If you have been drinking alcohol. Do not ride with someone who has been drinking. ?If you have been using any mind-altering substances or   drugs. ?While texting. ?When you are tired or distracted. ?Wear a helmet and other protective equipment during sports activities. ?If you have firearms in your house, make sure you  follow all gun safety procedures. ?Seek help if you have been physically or sexually abused. ?What's next? ?Go to your health care provider once a year for an annual wellness visit. ?Ask your health care provider how often you should have your eyes and teeth checked. ?Stay up to date on all vaccines. ?This information is not intended to replace advice given to you by your health care provider. Make sure you discuss any questions you have with your health care provider. ?Document Revised: 07/07/2020 Document Reviewed: 07/07/2020 ?Elsevier Patient Education ? 2022 [employer] ? ?

## 2021-03-22 ENCOUNTER — Other Ambulatory Visit: Payer: Self-pay

## 2021-03-22 ENCOUNTER — Encounter: Payer: Self-pay | Admitting: Family Medicine

## 2021-03-22 ENCOUNTER — Ambulatory Visit (INDEPENDENT_AMBULATORY_CARE_PROVIDER_SITE_OTHER): Payer: 59 | Admitting: Family Medicine

## 2021-03-22 VITALS — BP 106/62 | HR 80 | Temp 97.8°F | Resp 16 | Ht 73.0 in | Wt 242.0 lb

## 2021-03-22 DIAGNOSIS — E785 Hyperlipidemia, unspecified: Secondary | ICD-10-CM | POA: Diagnosis not present

## 2021-03-22 DIAGNOSIS — Z0000 Encounter for general adult medical examination without abnormal findings: Secondary | ICD-10-CM

## 2021-03-22 DIAGNOSIS — Z79899 Other long term (current) drug therapy: Secondary | ICD-10-CM | POA: Diagnosis not present

## 2021-03-22 DIAGNOSIS — E8881 Metabolic syndrome: Secondary | ICD-10-CM

## 2021-03-23 LAB — CBC WITH DIFFERENTIAL/PLATELET
Absolute Monocytes: 823 cells/uL (ref 200–950)
Basophils Absolute: 50 cells/uL (ref 0–200)
Basophils Relative: 0.6 %
Eosinophils Absolute: 118 cells/uL (ref 15–500)
Eosinophils Relative: 1.4 %
HCT: 44.6 % (ref 38.5–50.0)
Hemoglobin: 15.2 g/dL (ref 13.2–17.1)
Lymphs Abs: 2251 cells/uL (ref 850–3900)
MCH: 29.9 pg (ref 27.0–33.0)
MCHC: 34.1 g/dL (ref 32.0–36.0)
MCV: 87.8 fL (ref 80.0–100.0)
MPV: 9.9 fL (ref 7.5–12.5)
Monocytes Relative: 9.8 %
Neutro Abs: 5158 cells/uL (ref 1500–7800)
Neutrophils Relative %: 61.4 %
Platelets: 329 10*3/uL (ref 140–400)
RBC: 5.08 10*6/uL (ref 4.20–5.80)
RDW: 12.7 % (ref 11.0–15.0)
Total Lymphocyte: 26.8 %
WBC: 8.4 10*3/uL (ref 3.8–10.8)

## 2021-03-23 LAB — COMPLETE METABOLIC PANEL WITH GFR
AG Ratio: 1.7 (calc) (ref 1.0–2.5)
ALT: 14 U/L (ref 9–46)
AST: 15 U/L (ref 10–40)
Albumin: 4.7 g/dL (ref 3.6–5.1)
Alkaline phosphatase (APISO): 64 U/L (ref 36–130)
BUN: 11 mg/dL (ref 7–25)
CO2: 28 mmol/L (ref 20–32)
Calcium: 9.7 mg/dL (ref 8.6–10.3)
Chloride: 103 mmol/L (ref 98–110)
Creat: 1.05 mg/dL (ref 0.60–1.26)
Globulin: 2.8 g/dL (calc) (ref 1.9–3.7)
Glucose, Bld: 84 mg/dL (ref 65–99)
Potassium: 4.7 mmol/L (ref 3.5–5.3)
Sodium: 138 mmol/L (ref 135–146)
Total Bilirubin: 0.7 mg/dL (ref 0.2–1.2)
Total Protein: 7.5 g/dL (ref 6.1–8.1)
eGFR: 94 mL/min/{1.73_m2} (ref >=60)

## 2021-03-23 LAB — HEMOGLOBIN A1C
Hgb A1c MFr Bld: 5.4 % of total Hgb (ref <5.7)
Mean Plasma Glucose: 108 mg/dL
eAG (mmol/L): 6 mmol/L

## 2021-03-23 LAB — LIPID PANEL
Cholesterol: 195 mg/dL (ref <200)
HDL: 48 mg/dL (ref >=40)
LDL Cholesterol (Calc): 120 mg/dL (calc) — ABNORMAL HIGH
Non-HDL Cholesterol (Calc): 147 mg/dL (calc) — ABNORMAL HIGH (ref <130)
Total CHOL/HDL Ratio: 4.1 (calc) (ref <5.0)
Triglycerides: 158 mg/dL — ABNORMAL HIGH (ref <150)

## 2021-04-01 ENCOUNTER — Ambulatory Visit: Payer: 59 | Admitting: Family Medicine

## 2021-04-02 ENCOUNTER — Other Ambulatory Visit: Payer: Self-pay | Admitting: Family Medicine

## 2021-04-02 DIAGNOSIS — J301 Allergic rhinitis due to pollen: Secondary | ICD-10-CM

## 2021-04-18 NOTE — Progress Notes (Signed)
Name: Timothy Fleming   MRN: [id number]    DOB: [birth year]   Date:04/19/2021 ? ?     Progress Note ? ?Subjective ? ?Chief Complaint ? ?Follow Up ? ?HPI ? ?Depression Major:he is very happy at this time, he ran out of Adderal and felt very tired at the end of the day and lack of motivation, taking lexapro as prescribed. Work is good,. He would like to try weaning self off of medications at this time. We will go down on Lexapro to 5 mg and Adderal XR 10 mg daily  ?  ?GERD: under control with otc medication, no heartburn or regurgitation. He is taking PPI every other day now  ?  ?Insomnia; taking Seroquel every night, denies side effects. He is able to fall and stay asleep with medication. Unchanged  ?  ?Paresthesia : symptoms started earlier this year, saw Ortho and had EMG, diagnosed with carpal tunnel syndrome that is worse on left hand, he was advised to have MRI but he decided not to have it done since EMG showed carpal tunnel and symptoms are mild and intermittent Stable and does not want to go back at this time, symptoms are intermittent ?  ?Pre-diabetes: Denies polyphagia, polyuria but feels thirsty. He changed his diet, A1C is back to normal.  ?  ?Dyslipidemia: improved with life style modification  ?  ?Patient Active Problem List  ? Diagnosis Date Noted  ? Ankle instability 12/03/2017  ? Metabolic syndrome 81/44/8185  ? Moderate major depression ([place]) 11/30/2016  ? History of vasculitis of skin 05/20/2015  ? GERD without esophagitis 11/10/2014  ? Seasonal allergic rhinitis 11/10/2014  ? History of nephrolithiasis 11/10/2014  ? Obesity (BMI 30-39.9) 11/10/2014  ? Tobacco abuse 11/10/2014  ? Snoring 11/10/2014  ? Insomnia 11/10/2014  ? ? ?Past Surgical History:  ?Procedure Laterality Date  ? FACIAL RECONSTRUCTION SURGERY  01/13/2008  ? After a car fell on him while working on it  ? ? ?Family History  ?Problem Relation Age of Onset  ? Varicose Veins Mother   ? Endometriosis Mother   ? Drug abuse Father   ? GI  Disease Father   ? Heart attack Maternal Grandmother   ? Breast cancer Maternal Grandmother   ? Thyroid cancer Maternal Grandmother   ? Congestive Heart Failure Maternal Grandfather   ? Heart attack Maternal Grandfather   ? Stroke Maternal Grandfather   ? Breast cancer Paternal Grandmother   ? Heart attack Paternal Grandfather   ? ? ?Social History  ? ?Tobacco Use  ? Smoking status: Former  ?  Packs/day: 0.25  ?  Years: 15.00  ?  Pack years: 3.75  ?  Types: Cigarettes, E-cigarettes  ?  Start date: 11/10/1999  ?  Quit date: 06/05/2017  ?  Years since quitting: 3.8  ? Smokeless tobacco: Never  ? Tobacco comments:  ?  e cig mostly  ?Substance Use Topics  ? Alcohol use: No  ?  Alcohol/week: 0.0 standard drinks  ? ? ? ?Current Outpatient Medications:  ?  amphetamine-dextroamphetamine (ADDERALL XR) 15 MG 24 hr capsule, Take 1 capsule by mouth daily., Disp: 30 capsule, Rfl: 0 ?  amphetamine-dextroamphetamine (ADDERALL XR) 15 MG 24 hr capsule, Take 1 capsule by mouth every morning., Disp: 30 capsule, Rfl: 0 ?  amphetamine-dextroamphetamine (ADDERALL XR) 15 MG 24 hr capsule, Take 1 capsule by mouth every morning., Disp: 30 capsule, Rfl: 0 ?  EPINEPHrine 0.3 mg/0.3 mL IJ SOAJ injection, Inject 0.3 mg into the muscle  as needed for anaphylaxis., Disp: 2 each, Rfl: 0 ?  escitalopram (LEXAPRO) 10 MG tablet, Take 1 tablet (10 mg total) by mouth daily., Disp: 90 tablet, Rfl: 1 ?  fluticasone (FLONASE) 50 MCG/ACT nasal spray, SHAKE LIQUID AND USE 2 SPRAYS IN EACH NOSTRIL DAILY, Disp: 16 g, Rfl: 0 ?  ipratropium (ATROVENT) 0.03 % nasal spray, Place 2 sprays into both nostrils every 12 (twelve) hours., Disp: 30 mL, Rfl: 12 ?  omeprazole (PRILOSEC) 20 MG capsule, Take 20 mg by mouth daily., Disp: , Rfl:  ?  QUEtiapine (SEROQUEL) 25 MG tablet, TAKE 1 TABLET(25 MG) BY MOUTH AT BEDTIME, Disp: 90 tablet, Rfl: 1 ? ?Allergies  ?Allergen Reactions  ? Bee Venom Anaphylaxis, Shortness Of Breath and Swelling  ? Penicillins Hives  ?  vasculitis   ? ? ?I personally reviewed active problem list, medication list, allergies, family history, social history, health maintenance with the patient/caregiver today. ? ? ?ROS ? ?Constitutional: Negative for fever or weight change.  ?Respiratory: Negative for cough and shortness of breath.   ?Cardiovascular: Negative for chest pain or palpitations.  ?Gastrointestinal: Negative for abdominal pain, no bowel changes.  ?Musculoskeletal: Negative for gait problem or joint swelling.  ?Skin: Negative for rash.  ?Neurological: Negative for dizziness or headache.  ?No other specific complaints in a complete review of systems (except as listed in HPI above).  ? ?Objective ? ?Vitals:  ? 04/19/21 1405  ?BP: 120/70  ?Pulse: 90  ?Resp: 16  ?SpO2: 99%  ?Weight: 242 lb (109.8 kg)  ?Height: $RemoveB'6\' 1"'yviZljDf$  (1.854 m)  ? ? ?Body mass index is 31.93 kg/m?. ? ?Physical Exam ? ?Constitutional: Patient appears well-developed and well-nourished. Obese  No distress.  ?HEENT: head atraumatic, normocephalic, pupils equal and reactive to light, neck supple ?Cardiovascular: Normal rate, regular rhythm and normal heart sounds.  No murmur heard. No BLE edema. ?Pulmonary/Chest: Effort normal and breath sounds normal. No respiratory distress. ?Abdominal: Soft.  There is no tenderness. ?Psychiatric: Patient has a normal mood and affect. behavior is normal. Judgment and thought content normal.  ? ?Recent Results (from the past 2160 hour(s))  ?Lipid panel     Status: Abnormal  ? Collection Time: 03/22/21  2:15 PM  ?Result Value Ref Range  ? Cholesterol 195 <200 mg/dL  ? HDL 48 > OR = 40 mg/dL  ? Triglycerides 158 (H) <150 mg/dL  ? LDL Cholesterol (Calc) 120 (H) mg/dL (calc)  ?  Comment: Reference range: <100 ?Marland Kitchen ?Desirable range <100 mg/dL for primary prevention;   ?<70 mg/dL for patients with CHD or diabetic patients  ?with > or = 2 CHD risk factors. ?. ?LDL-C is now calculated using the Martin-Hopkins  ?calculation, which is a validated novel method providing   ?better accuracy than the Friedewald equation in the  ?estimation of LDL-C.  ?Cresenciano Genre et al. Annamaria Helling. 6195;093(26): 2061-2068  ?(http://education.[facility].com/faq/FAQ164) ?  ? Total CHOL/HDL Ratio 4.1 <5.0 (calc)  ? Non-HDL Cholesterol (Calc) 147 (H) <130 mg/dL (calc)  ?  Comment: For patients with diabetes plus 1 major ASCVD risk  ?factor, treating to a non-HDL-C goal of <100 mg/dL  ?(LDL-C of <70 mg/dL) is considered a therapeutic  ?option. ?  ?CBC with Differential/Platelet     Status: None  ? Collection Time: 03/22/21  2:15 PM  ?Result Value Ref Range  ? WBC 8.4 3.8 - 10.8 Thousand/uL  ? RBC 5.08 4.20 - 5.80 Million/uL  ? Hemoglobin 15.2 13.2 - 17.1 g/dL  ? HCT 44.6 38.5 - 50.0 %  ?  MCV 87.8 80.0 - 100.0 fL  ? MCH 29.9 27.0 - 33.0 pg  ? MCHC 34.1 32.0 - 36.0 g/dL  ? RDW 12.7 11.0 - 15.0 %  ? Platelets 329 140 - 400 Thousand/uL  ? MPV 9.9 7.5 - 12.5 fL  ? Neutro Abs 5,158 1,500 - 7,800 cells/uL  ? Lymphs Abs 2,251 850 - 3,900 cells/uL  ? Absolute Monocytes 823 200 - 950 cells/uL  ? Eosinophils Absolute 118 15 - 500 cells/uL  ? Basophils Absolute 50 0 - 200 cells/uL  ? Neutrophils Relative % 61.4 %  ? Total Lymphocyte 26.8 %  ? Monocytes Relative 9.8 %  ? Eosinophils Relative 1.4 %  ? Basophils Relative 0.6 %  ?COMPLETE METABOLIC PANEL WITH GFR     Status: None  ? Collection Time: 03/22/21  2:15 PM  ?Result Value Ref Range  ? Glucose, Bld 84 65 - 99 mg/dL  ?  Comment: . ?           Fasting reference interval ?. ?  ? BUN 11 7 - 25 mg/dL  ? Creat 1.05 0.60 - 1.26 mg/dL  ? eGFR 94 > OR = 60 mL/min/1.29m  ?  Comment: The eGFR is based on the CKD-EPI 2021 equation. To calculate  ?the new eGFR from a previous Creatinine or Cystatin C ?result, go to https://www.kidney.org/professionals/ ?kdoqi/gfr%5Fcalculator ?  ? BUN/Creatinine Ratio NOT APPLICABLE 6 - 22 (calc)  ? Sodium 138 135 - 146 mmol/L  ? Potassium 4.7 3.5 - 5.3 mmol/L  ? Chloride 103 98 - 110 mmol/L  ? CO2 28 20 - 32 mmol/L  ? Calcium 9.7 8.6 - 10.3  mg/dL  ? Total Protein 7.5 6.1 - 8.1 g/dL  ? Albumin 4.7 3.6 - 5.1 g/dL  ? Globulin 2.8 1.9 - 3.7 g/dL (calc)  ? AG Ratio 1.7 1.0 - 2.5 (calc)  ? Total Bilirubin 0.7 0.2 - 1.2 mg/dL  ? Alkaline phosphatase (APISO) 6

## 2021-04-19 ENCOUNTER — Ambulatory Visit: Payer: 59 | Admitting: Family Medicine

## 2021-04-19 ENCOUNTER — Encounter: Payer: Self-pay | Admitting: Family Medicine

## 2021-04-19 VITALS — BP 120/70 | HR 90 | Resp 16 | Ht 73.0 in | Wt 242.0 lb

## 2021-04-19 DIAGNOSIS — E785 Hyperlipidemia, unspecified: Secondary | ICD-10-CM | POA: Diagnosis not present

## 2021-04-19 DIAGNOSIS — K219 Gastro-esophageal reflux disease without esophagitis: Secondary | ICD-10-CM | POA: Diagnosis not present

## 2021-04-19 DIAGNOSIS — R69 Illness, unspecified: Secondary | ICD-10-CM | POA: Diagnosis not present

## 2021-04-19 DIAGNOSIS — F325 Major depressive disorder, single episode, in full remission: Secondary | ICD-10-CM

## 2021-04-19 DIAGNOSIS — E8881 Metabolic syndrome: Secondary | ICD-10-CM

## 2021-04-19 MED ORDER — AMPHETAMINE-DEXTROAMPHET ER 10 MG PO CP24: 10.0000 mg | ORAL_CAPSULE | ORAL | 0 refills | Status: DC

## 2021-04-19 MED ORDER — OMEPRAZOLE 20 MG PO CPDR: 20.0000 mg | DELAYED_RELEASE_CAPSULE | Freq: Every day | ORAL | 0 refills | Status: DC

## 2021-04-19 MED ORDER — AMPHETAMINE-DEXTROAMPHET ER 10 MG PO CP24: 10.0000 mg | ORAL_CAPSULE | Freq: Every day | ORAL | 0 refills | Status: DC

## 2021-04-19 MED ORDER — ESCITALOPRAM OXALATE 5 MG PO TABS: 5.0000 mg | ORAL_TABLET | Freq: Every day | ORAL | 0 refills | Status: DC

## 2021-05-07 ENCOUNTER — Other Ambulatory Visit: Payer: Self-pay | Admitting: Family Medicine

## 2021-05-07 DIAGNOSIS — J301 Allergic rhinitis due to pollen: Secondary | ICD-10-CM

## 2021-05-23 ENCOUNTER — Encounter: Payer: Self-pay | Admitting: Family Medicine

## 2021-05-23 ENCOUNTER — Ambulatory Visit (INDEPENDENT_AMBULATORY_CARE_PROVIDER_SITE_OTHER): Payer: 59 | Admitting: Family Medicine

## 2021-05-23 VITALS — BP 138/76 | HR 94 | Temp 98.2°F | Resp 18 | Ht 74.0 in | Wt 238.1 lb

## 2021-05-23 DIAGNOSIS — F439 Reaction to severe stress, unspecified: Secondary | ICD-10-CM

## 2021-05-23 DIAGNOSIS — R69 Illness, unspecified: Secondary | ICD-10-CM | POA: Diagnosis not present

## 2021-05-23 DIAGNOSIS — F4323 Adjustment disorder with mixed anxiety and depressed mood: Secondary | ICD-10-CM

## 2021-05-23 NOTE — Progress Notes (Deleted)
Name: Timothy Fleming   MRN: [id number]    DOB: 09-02-1985   Date:05/23/2021       Progress Note  Subjective  Chief Complaint  Chief Complaint  Patient presents with   Depression   Anxiety    HPI  *** Patient Active Problem List   Diagnosis Date Noted   Ankle instability 12/03/2017   Metabolic syndrome 12/13/2016   Moderate major depression ([facility]) 11/30/2016   History of vasculitis of skin 05/20/2015   GERD without esophagitis 11/10/2014   Seasonal allergic rhinitis 11/10/2014   History of nephrolithiasis 11/10/2014   Obesity (BMI 30-39.9) 11/10/2014   Tobacco abuse 11/10/2014   Snoring 11/10/2014   Insomnia 11/10/2014    Past Surgical History:  Procedure Laterality Date   FACIAL RECONSTRUCTION SURGERY  01/13/2008   After a car fell on him while working on it    Family History  Problem Relation Age of Onset   Varicose Veins Mother    Endometriosis Mother    Drug abuse Father    GI Disease Father    Heart attack Maternal Grandmother    Breast cancer Maternal Grandmother    Thyroid cancer Maternal Grandmother    Congestive Heart Failure Maternal Grandfather    Heart attack Maternal Grandfather    Stroke Maternal Grandfather    Breast cancer Paternal Grandmother    Heart attack Paternal Grandfather     Social History   Tobacco Use   Smoking status: Former    Packs/day: 0.25    Years: 15.00    Pack years: 3.75    Types: Cigarettes, E-cigarettes    Start date: 11/10/1999    Quit date: 06/05/2017    Years since quitting: 3.9   Smokeless tobacco: Never   Tobacco comments:    e cig mostly  Substance Use Topics   Alcohol use: No    Alcohol/week: 0.0 standard drinks     Current Outpatient Medications:    amphetamine-dextroamphetamine (ADDERALL XR) 10 MG 24 hr capsule, Take 1 capsule (10 mg total) by mouth daily., Disp: 30 capsule, Rfl: 0   amphetamine-dextroamphetamine (ADDERALL XR) 10 MG 24 hr capsule, Take 1 capsule (10 mg total) by mouth every morning.,  Disp: 30 capsule, Rfl: 0   amphetamine-dextroamphetamine (ADDERALL XR) 10 MG 24 hr capsule, Take 1 capsule (10 mg total) by mouth every morning., Disp: 30 capsule, Rfl: 0   EPINEPHrine 0.3 mg/0.3 mL IJ SOAJ injection, Inject 0.3 mg into the muscle as needed for anaphylaxis., Disp: 2 each, Rfl: 0   escitalopram (LEXAPRO) 10 MG tablet, Take 10 mg by mouth daily., Disp: , Rfl:    escitalopram (LEXAPRO) 5 MG tablet, Take 1 tablet (5 mg total) by mouth daily., Disp: 90 tablet, Rfl: 0   fluticasone (FLONASE) 50 MCG/ACT nasal spray, SHAKE LIQUID AND USE 2 SPRAYS IN EACH NOSTRIL DAILY, Disp: 16 g, Rfl: 0   omeprazole (PRILOSEC) 20 MG capsule, Take 1 capsule (20 mg total) by mouth daily., Disp: 90 capsule, Rfl: 0   QUEtiapine (SEROQUEL) 25 MG tablet, TAKE 1 TABLET(25 MG) BY MOUTH AT BEDTIME, Disp: 90 tablet, Rfl: 1  Allergies  Allergen Reactions   Bee Venom Anaphylaxis, Shortness Of Breath and Swelling   Penicillins Hives    vasculitis    I personally reviewed {Reviewed:14835} with the patient/caregiver today.   ROS  ***  Objective  Vitals:   05/23/21 1042  BP: 138/76  Pulse: 94  Resp: 18  Temp: 98.2 F (36.8 C)  TempSrc: Oral  SpO2:  98%  Weight: 238 lb 1.6 oz (108 kg)  Height: 6\' 2"  (1.88 m)    Body mass index is 30.57 kg/m.  Physical Exam ***  Recent Results (from the past 2160 hour(s))  Lipid panel     Status: Abnormal   Collection Time: 03/22/21  2:15 PM  Result Value Ref Range   Cholesterol 195 <200 mg/dL   HDL 48 > OR = 40 mg/dL   Triglycerides 604 (H) <150 mg/dL   LDL Cholesterol (Calc) 120 (H) mg/dL (calc)    Comment: Reference range: <100 . Desirable range <100 mg/dL for primary prevention;   <70 mg/dL for patients with CHD or diabetic patients  with > or = 2 CHD risk factors. Marland Kitchen LDL-C is now calculated using the Martin-Hopkins  calculation, which is a validated novel method providing  better accuracy than the Friedewald equation in the  estimation of  LDL-C.  Horald Pollen et al. Lenox Ahr. 5409;811(91): 2061-2068  (http://education.[facility].com/faq/FAQ164)    Total CHOL/HDL Ratio 4.1 <5.0 (calc)   Non-HDL Cholesterol (Calc) 147 (H) <130 mg/dL (calc)    Comment: For patients with diabetes plus 1 major ASCVD risk  factor, treating to a non-HDL-C goal of <100 mg/dL  (LDL-C of <47 mg/dL) is considered a therapeutic  option.   CBC with Differential/Platelet     Status: None   Collection Time: 03/22/21  2:15 PM  Result Value Ref Range   WBC 8.4 3.8 - 10.8 Thousand/uL   RBC 5.08 4.20 - 5.80 Million/uL   Hemoglobin 15.2 13.2 - 17.1 g/dL   HCT 82.9 56.2 - 13.0 %   MCV 87.8 80.0 - 100.0 fL   MCH 29.9 27.0 - 33.0 pg   MCHC 34.1 32.0 - 36.0 g/dL   RDW 86.5 78.4 - 69.6 %   Platelets 329 140 - 400 Thousand/uL   MPV 9.9 7.5 - 12.5 fL   Neutro Abs 5,158 1,500 - 7,800 cells/uL   Lymphs Abs 2,251 850 - 3,900 cells/uL   Absolute Monocytes 823 200 - 950 cells/uL   Eosinophils Absolute 118 15 - 500 cells/uL   Basophils Absolute 50 0 - 200 cells/uL   Neutrophils Relative % 61.4 %   Total Lymphocyte 26.8 %   Monocytes Relative 9.8 %   Eosinophils Relative 1.4 %   Basophils Relative 0.6 %  COMPLETE METABOLIC PANEL WITH GFR     Status: None   Collection Time: 03/22/21  2:15 PM  Result Value Ref Range   Glucose, Bld 84 65 - 99 mg/dL    Comment: .            Fasting reference interval .    BUN 11 7 - 25 mg/dL   Creat 2.95 2.84 - 1.32 mg/dL   eGFR 94 > OR = 60 GM/WNU/2.72Z3    Comment: The eGFR is based on the CKD-EPI 2021 equation. To calculate  the new eGFR from a previous Creatinine or Cystatin C result, go to https://www.kidney.org/professionals/ kdoqi/gfr%5Fcalculator    BUN/Creatinine Ratio NOT APPLICABLE 6 - 22 (calc)   Sodium 138 135 - 146 mmol/L   Potassium 4.7 3.5 - 5.3 mmol/L   Chloride 103 98 - 110 mmol/L   CO2 28 20 - 32 mmol/L   Calcium 9.7 8.6 - 10.3 mg/dL   Total Protein 7.5 6.1 - 8.1 g/dL   Albumin 4.7 3.6 - 5.1 g/dL    Globulin 2.8 1.9 - 3.7 g/dL (calc)   AG Ratio 1.7 1.0 - 2.5 (calc)   Total Bilirubin  0.7 0.2 - 1.2 mg/dL   Alkaline phosphatase (APISO) 64 36 - 130 U/L   AST 15 10 - 40 U/L   ALT 14 9 - 46 U/L  Hemoglobin A1c     Status: None   Collection Time: 03/22/21  2:15 PM  Result Value Ref Range   Hgb A1c MFr Bld 5.4 <5.7 % of total Hgb    Comment: For the purpose of screening for the presence of diabetes: . <5.7%       Consistent with the absence of diabetes 5.7-6.4%    Consistent with increased risk for diabetes             (prediabetes) > or =6.5%  Consistent with diabetes . This assay result is consistent with a decreased risk of diabetes. . Currently, no consensus exists regarding use of hemoglobin A1c for diagnosis of diabetes in children. . According to American Diabetes Association (ADA) guidelines, hemoglobin A1c <7.0% represents optimal control in non-pregnant diabetic patients. Different metrics may apply to specific patient populations.  Standards of Medical Care in Diabetes(ADA). .    Mean Plasma Glucose 108 mg/dL   eAG (mmol/L) 6.0 mmol/L    Diabetic Foot Exam: Diabetic Foot Exam - Simple   No data filed    ***  PHQ2/9:    04/19/2021    2:04 PM 03/22/2021    1:40 PM 01/07/2021    1:22 PM 09/20/2020    2:56 PM 06/14/2020    3:41 PM  Depression screen PHQ 2/9  Decreased Interest 0 0 0 0 0  Down, Depressed, Hopeless 0 0 0 0 0  PHQ - 2 Score 0 0 0 0 0  Altered sleeping 0 0 0 1 0  Tired, decreased energy 0 0 0 0 0  Change in appetite 0 0 0 0 0  Feeling bad or failure about yourself  0 0 0 0 0  Trouble concentrating 0 0 0 1 0  Moving slowly or fidgety/restless 0 0 0 0 0  Suicidal thoughts 0 0 0 0 0  PHQ-9 Score 0 0 0 2 0  Difficult doing work/chores  Not difficult at all Not difficult at all      phq 9 is {gen pos [id number]} ***  Fall Risk:    05/23/2021   10:41 AM 04/19/2021    2:04 PM 03/22/2021    1:40 PM 01/07/2021    1:22 PM 09/20/2020    2:56  PM  Fall Risk   Falls in the past year? 0 0 0 1 0  Number falls in past yr:  0 0 0 0  Injury with Fall?  0 0 0 0  Risk for fall due to : No Fall Risks No Fall Risks No Fall Risks Impaired balance/gait No Fall Risks  Follow up Falls prevention discussed Falls prevention discussed Falls prevention discussed Falls prevention discussed Falls prevention discussed   ***   Functional Status Survey: Is the patient deaf or have difficulty hearing?: No Does the patient have difficulty seeing, even when wearing glasses/contacts?: No Does the patient have difficulty concentrating, remembering, or making decisions?: Yes Does the patient have difficulty walking or climbing stairs?: No Does the patient have difficulty dressing or bathing?: No Does the patient have difficulty doing errands alone such as visiting a doctor's office or shopping?: No ***   Assessment & Plan  *** There are no diagnoses linked to this encounter.

## 2021-05-23 NOTE — Progress Notes (Signed)
? ? ?Patient ID: Timothy Fleming, male    DOB: 07-29-1985, 36 y.o.   MRN: [id number] ? ?PCP: Steele Sizer, MD ? ?Chief Complaint  ?Patient presents with  ? Depression  ? Anxiety  ? ? ?Subjective:  ? ?Timothy Fleming is a 36 y.o. male, presents to clinic with CC of the following: ? ?HPI  ?Pt presents for increased situational stress, he has been working with his PCP with Dr. Ancil Boozer for getting off meds gradually, but last week things seem to be getting worse, esp with work related stress and conflict ?He left work Friday for personal health reasons needs a work  ?He attempted to work with supervisors to resolve some concerns, but it only seemed to make things worse ?He did start to increase lexapro dose again - on 10 mg ?Has family and friends for support ?Has never talked to a therapist or psychiatry ?Denies SI, HI, AVH ?Having really hard time with anxiety, worry, he can't sleep, never had this problem before working at this place ? ? ? ? ?Patient Active Problem List  ? Diagnosis Date Noted  ? Ankle instability 12/03/2017  ? Metabolic syndrome 76/19/5093  ? Moderate major depression ([place]) 11/30/2016  ? History of vasculitis of skin 05/20/2015  ? GERD without esophagitis 11/10/2014  ? Seasonal allergic rhinitis 11/10/2014  ? History of nephrolithiasis 11/10/2014  ? Obesity (BMI 30-39.9) 11/10/2014  ? Tobacco abuse 11/10/2014  ? Snoring 11/10/2014  ? Insomnia 11/10/2014  ? ? ? ? ?Current Outpatient Medications:  ?  amphetamine-dextroamphetamine (ADDERALL XR) 10 MG 24 hr capsule, Take 1 capsule (10 mg total) by mouth daily., Disp: 30 capsule, Rfl: 0 ?  amphetamine-dextroamphetamine (ADDERALL XR) 10 MG 24 hr capsule, Take 1 capsule (10 mg total) by mouth every morning., Disp: 30 capsule, Rfl: 0 ?  amphetamine-dextroamphetamine (ADDERALL XR) 10 MG 24 hr capsule, Take 1 capsule (10 mg total) by mouth every morning., Disp: 30 capsule, Rfl: 0 ?  EPINEPHrine 0.3 mg/0.3 mL IJ SOAJ injection, Inject 0.3 mg into the muscle as  needed for anaphylaxis., Disp: 2 each, Rfl: 0 ?  escitalopram (LEXAPRO) 10 MG tablet, Take 10 mg by mouth daily., Disp: , Rfl:  ?  escitalopram (LEXAPRO) 5 MG tablet, Take 1 tablet (5 mg total) by mouth daily., Disp: 90 tablet, Rfl: 0 ?  fluticasone (FLONASE) 50 MCG/ACT nasal spray, SHAKE LIQUID AND USE 2 SPRAYS IN EACH NOSTRIL DAILY, Disp: 16 g, Rfl: 0 ?  omeprazole (PRILOSEC) 20 MG capsule, Take 1 capsule (20 mg total) by mouth daily., Disp: 90 capsule, Rfl: 0 ?  QUEtiapine (SEROQUEL) 25 MG tablet, TAKE 1 TABLET(25 MG) BY MOUTH AT BEDTIME, Disp: 90 tablet, Rfl: 1 ? ? ?Allergies  ?Allergen Reactions  ? Bee Venom Anaphylaxis, Shortness Of Breath and Swelling  ? Penicillins Hives  ?  vasculitis  ? ? ? ?Social History  ? ?Tobacco Use  ? Smoking status: Former  ?  Packs/day: 0.25  ?  Years: 15.00  ?  Pack years: 3.75  ?  Types: Cigarettes, E-cigarettes  ?  Start date: 11/10/1999  ?  Quit date: 06/05/2017  ?  Years since quitting: 3.9  ? Smokeless tobacco: Never  ? Tobacco comments:  ?  e cig mostly  ?Vaping Use  ? Vaping Use: Every day  ? Start date: 11/28/2011  ? Substances: Nicotine, Flavoring  ? Devices: Box Mod  ?Substance Use Topics  ? Alcohol use: No  ?  Alcohol/week: 0.0 standard drinks  ?  Drug use: No  ?  ? ? ?Chart Review Today: ?I personally reviewed active problem list, medication list, allergies, family history, social history, health maintenance, notes from last encounter, lab results, imaging with the patient/caregiver today. ? ? ?Review of Systems  ?Constitutional: Negative.   ?HENT: Negative.    ?Eyes: Negative.   ?Respiratory: Negative.    ?Cardiovascular: Negative.   ?Gastrointestinal: Negative.   ?Endocrine: Negative.   ?Genitourinary: Negative.   ?Musculoskeletal: Negative.   ?Skin: Negative.   ?Allergic/Immunologic: Negative.   ?Neurological: Negative.   ?Hematological: Negative.   ?Psychiatric/Behavioral: Negative.    ?All other systems reviewed and are negative. ? ?   ?Objective:  ? ?Vitals:  ?  05/23/21 1042  ?BP: 138/76  ?Pulse: 94  ?Resp: 18  ?Temp: 98.2 ?F (36.8 ?C)  ?TempSrc: Oral  ?SpO2: 98%  ?Weight: 238 lb 1.6 oz (108 kg)  ?Height: $RemoveB'6\' 2"'VOPQGeTH$  (1.88 m)  ?  ?Body mass index is 30.57 kg/m?. ? ?Physical Exam ?Vitals and nursing note reviewed.  ?Constitutional:   ?   General: He is not in acute distress. ?   Appearance: He is well-developed. He is not ill-appearing, toxic-appearing or diaphoretic.  ?HENT:  ?   Head: Normocephalic and atraumatic.  ?   Nose: Nose normal.  ?Eyes:  ?   General:     ?   Right eye: No discharge.     ?   Left eye: No discharge.  ?   Conjunctiva/sclera: Conjunctivae normal.  ?Neck:  ?   Trachea: No tracheal deviation.  ?Cardiovascular:  ?   Rate and Rhythm: Normal rate and regular rhythm.  ?Pulmonary:  ?   Effort: Pulmonary effort is normal. No respiratory distress.  ?   Breath sounds: No stridor.  ?Musculoskeletal:     ?   General: Normal range of motion.  ?Skin: ?   General: Skin is warm and dry.  ?   Findings: No rash.  ?Neurological:  ?   Mental Status: He is alert.  ?   Motor: No abnormal muscle tone.  ?   Coordination: Coordination normal.  ?Psychiatric:     ?   Attention and Perception: Perception normal.     ?   Mood and Affect: Mood is anxious and depressed.     ?   Speech: Speech normal.     ?   Behavior: Behavior normal. Behavior is cooperative.     ?   Thought Content: Thought content does not include homicidal or suicidal ideation. Thought content does not include homicidal or suicidal plan.  ?   Comments: Poor eye contact  ?  ? ?Results for orders placed or performed in visit on 03/22/21  ?Lipid panel  ?Result Value Ref Range  ? Cholesterol 195 <200 mg/dL  ? HDL 48 > OR = 40 mg/dL  ? Triglycerides 158 (H) <150 mg/dL  ? LDL Cholesterol (Calc) 120 (H) mg/dL (calc)  ? Total CHOL/HDL Ratio 4.1 <5.0 (calc)  ? Non-HDL Cholesterol (Calc) 147 (H) <130 mg/dL (calc)  ?CBC with Differential/Platelet  ?Result Value Ref Range  ? WBC 8.4 3.8 - 10.8 Thousand/uL  ? RBC 5.08 4.20 - 5.80  Million/uL  ? Hemoglobin 15.2 13.2 - 17.1 g/dL  ? HCT 44.6 38.5 - 50.0 %  ? MCV 87.8 80.0 - 100.0 fL  ? MCH 29.9 27.0 - 33.0 pg  ? MCHC 34.1 32.0 - 36.0 g/dL  ? RDW 12.7 11.0 - 15.0 %  ? Platelets 329 140 - 400 Thousand/uL  ?  MPV 9.9 7.5 - 12.5 fL  ? Neutro Abs 5,158 1,500 - 7,800 cells/uL  ? Lymphs Abs 2,251 850 - 3,900 cells/uL  ? Absolute Monocytes 823 200 - 950 cells/uL  ? Eosinophils Absolute 118 15 - 500 cells/uL  ? Basophils Absolute 50 0 - 200 cells/uL  ? Neutrophils Relative % 61.4 %  ? Total Lymphocyte 26.8 %  ? Monocytes Relative 9.8 %  ? Eosinophils Relative 1.4 %  ? Basophils Relative 0.6 %  ?COMPLETE METABOLIC PANEL WITH GFR  ?Result Value Ref Range  ? Glucose, Bld 84 65 - 99 mg/dL  ? BUN 11 7 - 25 mg/dL  ? Creat 1.05 0.60 - 1.26 mg/dL  ? eGFR 94 > OR = 60 mL/min/1.51m  ? BUN/Creatinine Ratio NOT APPLICABLE 6 - 22 (calc)  ? Sodium 138 135 - 146 mmol/L  ? Potassium 4.7 3.5 - 5.3 mmol/L  ? Chloride 103 98 - 110 mmol/L  ? CO2 28 20 - 32 mmol/L  ? Calcium 9.7 8.6 - 10.3 mg/dL  ? Total Protein 7.5 6.1 - 8.1 g/dL  ? Albumin 4.7 3.6 - 5.1 g/dL  ? Globulin 2.8 1.9 - 3.7 g/dL (calc)  ? AG Ratio 1.7 1.0 - 2.5 (calc)  ? Total Bilirubin 0.7 0.2 - 1.2 mg/dL  ? Alkaline phosphatase (APISO) 64 36 - 130 U/L  ? AST 15 10 - 40 U/L  ? ALT 14 9 - 46 U/L  ?Hemoglobin A1c  ?Result Value Ref Range  ? Hgb A1c MFr Bld 5.4 <5.7 % of total Hgb  ? Mean Plasma Glucose 108 mg/dL  ? eAG (mmol/L) 6.0 mmol/L  ? ? ?  05/23/2021  ? 10:50 AM 04/19/2021  ?  2:04 PM 03/22/2021  ?  1:40 PM  ?Depression screen PHQ 2/9  ?Decreased Interest 1 0 0  ?Down, Depressed, Hopeless 3 0 0  ?PHQ - 2 Score 4 0 0  ?Altered sleeping 3 0 0  ?Tired, decreased energy 3 0 0  ?Change in appetite 3 0 0  ?Feeling bad or failure about yourself  3 0 0  ?Trouble concentrating 1 0 0  ?Moving slowly or fidgety/restless 3 0 0  ?Suicidal thoughts 0 0 0  ?PHQ-9 Score 20 0 0  ?Difficult doing work/chores Extremely dIfficult  Not difficult at all  ? ? ?  05/23/2021  ? 10:51 AM  10/29/2019  ?  8:13 AM 01/08/2018  ? 11:53 AM 10/17/2017  ? 10:37 AM  ?GAD 7 : Generalized Anxiety Score  ?Nervous, Anxious, on Edge 3 0 0 0  ?Control/stop worrying 3 3 3 3   ?Worry too much - different th

## 2021-05-25 ENCOUNTER — Ambulatory Visit: Payer: 59 | Admitting: Family Medicine

## 2021-05-25 ENCOUNTER — Encounter: Payer: Self-pay | Admitting: Family Medicine

## 2021-05-25 VITALS — BP 118/72 | HR 83 | Temp 98.3°F | Resp 18 | Ht 74.0 in | Wt 237.0 lb

## 2021-05-25 DIAGNOSIS — R69 Illness, unspecified: Secondary | ICD-10-CM | POA: Diagnosis not present

## 2021-05-25 DIAGNOSIS — F339 Major depressive disorder, recurrent, unspecified: Secondary | ICD-10-CM

## 2021-05-25 DIAGNOSIS — G4709 Other insomnia: Secondary | ICD-10-CM | POA: Diagnosis not present

## 2021-05-25 MED ORDER — QUETIAPINE FUMARATE 25 MG PO TABS: ORAL_TABLET | ORAL | 0 refills | Status: DC

## 2021-05-25 NOTE — Progress Notes (Signed)
Name: Timothy Fleming   MRN: [id number]    DOB: August 25, 1985   Date:05/25/2021 ? ?     Progress Note ? ?Subjective ? ?Chief Complaint ? ?Chief Complaint  ?Patient presents with  ? Follow-up  ? ? ?HPI ? ?Depression Major: he saw him in March and was doing well. Work was good and we went down on Lexapro to 5 mg and resumed Adderal XR 10 mg.  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 He felt harassed and tried to talk to his bigger supervisor and was yelled at. He was seen by Delsa Grana and advised to go up on dose of Lexapro . He has been out of work since last week. He wants to change jobs, he has been written up three times in the last month. He does not trust her co-workers, feels like they are spreading rumors about him behind his back. He has been working for the [employer] for the past 7.5 years. He usually gets along well with people. His wife has been supportive about him getting out of his current job. He feels like they are trying to fire him or get him to quit  ? ?He is able to shower ?Staying busy to not think about work ?Appetite is poor and lost 5 lbs since March ?He is trying to apply for another jobs ?Crying spells ?Feeling hopeless ? ?  ? ?Patient Active Problem List  ? Diagnosis Date Noted  ? Ankle instability 12/03/2017  ? Metabolic syndrome 38/25/0539  ? Moderate major depression ([place]) 11/30/2016  ? History of vasculitis of skin 05/20/2015  ? GERD without esophagitis 11/10/2014  ? Seasonal allergic rhinitis 11/10/2014  ? History of nephrolithiasis 11/10/2014  ? Obesity (BMI 30-39.9) 11/10/2014  ? Tobacco abuse 11/10/2014  ? Snoring 11/10/2014  ? Insomnia 11/10/2014  ? ? ?Past Surgical History:  ?Procedure Laterality Date  ? FACIAL RECONSTRUCTION SURGERY  01/13/2008  ? After a car fell on him while working on it  ? ? ?Family History  ?Problem Relation Age of Onset  ? Varicose Veins Mother   ? Endometriosis Mother   ?  Drug abuse Father   ? GI Disease Father   ? Heart attack Maternal Grandmother   ? Breast cancer Maternal Grandmother   ? Thyroid cancer Maternal Grandmother   ? Congestive Heart Failure Maternal Grandfather   ? Heart attack Maternal Grandfather   ? Stroke Maternal Grandfather   ? Breast cancer Paternal Grandmother   ? Heart attack Paternal Grandfather   ? ? ?Social History  ? ?Tobacco Use  ? Smoking status: Former  ?  Packs/day: 0.25  ?  Years: 15.00  ?  Pack years: 3.75  ?  Types: Cigarettes, E-cigarettes  ?  Start date: 11/10/1999  ?  Quit date: 06/05/2017  ?  Years since quitting: 3.9  ? Smokeless tobacco: Never  ? Tobacco comments:  ?  e cig mostly  ?Substance Use Topics  ? Alcohol use: No  ?  Alcohol/week: 0.0 standard drinks  ? ? ? ?Current Outpatient Medications:  ?  EPINEPHrine 0.3 mg/0.3 mL IJ SOAJ injection, Inject 0.3 mg into the muscle as needed for anaphylaxis., Disp: 2 each, Rfl: 0 ?  escitalopram (LEXAPRO) 10 MG tablet, Take 10 mg by mouth daily., Disp: , Rfl:  ?  fluticasone (FLONASE) 50 MCG/ACT nasal spray, SHAKE LIQUID AND USE 2  SPRAYS IN EACH NOSTRIL DAILY, Disp: 16 g, Rfl: 0 ?  omeprazole (PRILOSEC) 20 MG capsule, Take 1 capsule (20 mg total) by mouth daily., Disp: 90 capsule, Rfl: 0 ?  QUEtiapine (SEROQUEL) 25 MG tablet, TAKE 1 TABLET(25 MG) BY MOUTH AT BEDTIME, Disp: 90 tablet, Rfl: 0 ? ?Allergies  ?Allergen Reactions  ? Bee Venom Anaphylaxis, Shortness Of Breath and Swelling  ? Penicillins Hives  ?  vasculitis  ? ? ?I personally reviewed active problem list, medication list, allergies, family history, social history with the patient/caregiver today. ? ? ?ROS ? ?Ten systems reviewed and is negative except as mentioned in HPI  ? ?Objective ? ?Vitals:  ? 05/25/21 1338  ?BP: 118/72  ?Pulse: 83  ?Resp: 18  ?Temp: 98.3 ?F (36.8 ?C)  ?TempSrc: Oral  ?SpO2: 99%  ?Weight: 237 lb (107.5 kg)  ?Height: $RemoveB'6\' 2"'TlNohgSV$  (1.88 m)  ? ? ?Body mass index is 30.43 kg/m?. ? ?Physical Exam ? ?Constitutional: Patient appears  well-developed and well-nourished. Obese  No distress.  ?HEENT: head atraumatic, normocephalic, pupils equal and reactive to light, neck supple ?Cardiovascular: Normal rate, regular rhythm and normal heart sounds.  No murmur heard. No BLE edema. ?Pulmonary/Chest: Effort normal and breath sounds normal. No respiratory distress. ?Abdominal: Soft.  There is no tenderness. ?Psychiatric: Patient appears depression, crying during the visit. Normal speech patterned, well dressed and clean  ? ?Recent Results (from the past 2160 hour(s))  ?Lipid panel     Status: Abnormal  ? Collection Time: 03/22/21  2:15 PM  ?Result Value Ref Range  ? Cholesterol 195 <200 mg/dL  ? HDL 48 > OR = 40 mg/dL  ? Triglycerides 158 (H) <150 mg/dL  ? LDL Cholesterol (Calc) 120 (H) mg/dL (calc)  ?  Comment: Reference range: <100 ?Marland Kitchen ?Desirable range <100 mg/dL for primary prevention;   ?<70 mg/dL for patients with CHD or diabetic patients  ?with > or = 2 CHD risk factors. ?. ?LDL-C is now calculated using the Martin-Hopkins  ?calculation, which is a validated novel method providing  ?better accuracy than the Friedewald equation in the  ?estimation of LDL-C.  ?Cresenciano Genre et al. Annamaria Helling. 1027;253(66): 2061-2068  ?(http://education.[facility].com/faq/FAQ164) ?  ? Total CHOL/HDL Ratio 4.1 <5.0 (calc)  ? Non-HDL Cholesterol (Calc) 147 (H) <130 mg/dL (calc)  ?  Comment: For patients with diabetes plus 1 major ASCVD risk  ?factor, treating to a non-HDL-C goal of <100 mg/dL  ?(LDL-C of <70 mg/dL) is considered a therapeutic  ?option. ?  ?CBC with Differential/Platelet     Status: None  ? Collection Time: 03/22/21  2:15 PM  ?Result Value Ref Range  ? WBC 8.4 3.8 - 10.8 Thousand/uL  ? RBC 5.08 4.20 - 5.80 Million/uL  ? Hemoglobin 15.2 13.2 - 17.1 g/dL  ? HCT 44.6 38.5 - 50.0 %  ? MCV 87.8 80.0 - 100.0 fL  ? MCH 29.9 27.0 - 33.0 pg  ? MCHC 34.1 32.0 - 36.0 g/dL  ? RDW 12.7 11.0 - 15.0 %  ? Platelets 329 140 - 400 Thousand/uL  ? MPV 9.9 7.5 - 12.5 fL  ? Neutro  Abs 5,158 1,500 - 7,800 cells/uL  ? Lymphs Abs 2,251 850 - 3,900 cells/uL  ? Absolute Monocytes 823 200 - 950 cells/uL  ? Eosinophils Absolute 118 15 - 500 cells/uL  ? Basophils Absolute 50 0 - 200 cells/uL  ? Neutrophils Relative % 61.4 %  ? Total Lymphocyte 26.8 %  ? Monocytes Relative 9.8 %  ? Eosinophils Relative 1.4 %  ?  Basophils Relative 0.6 %  ?COMPLETE METABOLIC PANEL WITH GFR     Status: None  ? Collection Time: 03/22/21  2:15 PM  ?Result Value Ref Range  ? Glucose, Bld 84 65 - 99 mg/dL  ?  Comment: . ?           Fasting reference interval ?. ?  ? BUN 11 7 - 25 mg/dL  ? Creat 1.05 0.60 - 1.26 mg/dL  ? eGFR 94 > OR = 60 mL/min/1.71m  ?  Comment: The eGFR is based on the CKD-EPI 2021 equation. To calculate  ?the new eGFR from a previous Creatinine or Cystatin C ?result, go to https://www.kidney.org/professionals/ ?kdoqi/gfr%5Fcalculator ?  ? BUN/Creatinine Ratio NOT APPLICABLE 6 - 22 (calc)  ? Sodium 138 135 - 146 mmol/L  ? Potassium 4.7 3.5 - 5.3 mmol/L  ? Chloride 103 98 - 110 mmol/L  ? CO2 28 20 - 32 mmol/L  ? Calcium 9.7 8.6 - 10.3 mg/dL  ? Total Protein 7.5 6.1 - 8.1 g/dL  ? Albumin 4.7 3.6 - 5.1 g/dL  ? Globulin 2.8 1.9 - 3.7 g/dL (calc)  ? AG Ratio 1.7 1.0 - 2.5 (calc)  ? Total Bilirubin 0.7 0.2 - 1.2 mg/dL  ? Alkaline phosphatase (APISO) 64 36 - 130 U/L  ? AST 15 10 - 40 U/L  ? ALT 14 9 - 46 U/L  ?Hemoglobin A1c     Status: None  ? Collection Time: 03/22/21  2:15 PM  ?Result Value Ref Range  ? Hgb A1c MFr Bld 5.4 <5.7 % of total Hgb  ?  Comment: For the purpose of screening for the presence of ?diabetes: ?. ?<5.7%       Consistent with the absence of diabetes ?5.7-6.4%    Consistent with increased risk for diabetes ?            (prediabetes) ?> or =6.5%  Consistent with diabetes ?.Marland Kitchen?This assay result is consistent with a decreased risk ?of diabetes. ?. ?Currently, no consensus exists regarding use of ?hemoglobin A1c for diagnosis of diabetes in children. ?. ?According to American Diabetes  Association (ADA) ?guidelines, hemoglobin A1c <7.0% represents optimal ?control in non-pregnant diabetic patients. Different ?metrics may apply to specific patient populations.  ?Standards of [place]

## 2021-05-26 ENCOUNTER — Encounter: Payer: Self-pay | Admitting: Family Medicine

## 2021-06-10 ENCOUNTER — Other Ambulatory Visit: Payer: Self-pay | Admitting: Family Medicine

## 2021-06-10 DIAGNOSIS — J301 Allergic rhinitis due to pollen: Secondary | ICD-10-CM

## 2021-06-15 ENCOUNTER — Ambulatory Visit: Payer: 59 | Admitting: Family Medicine

## 2021-06-27 NOTE — Progress Notes (Deleted)
Psychiatric Initial Adult Assessment   Patient Identification: Timothy Fleming MRN:  [id number] Date of Evaluation:  06/27/2021 Referral Source: *** Chief Complaint:  No chief complaint on file.  Visit Diagnosis: No diagnosis found.  History of Present Illness:   Timothy Fleming is a 36 y.o. year old male with a history of depression, GERD, who is referred for depression.     Medication- lexapro 10 mg daily, quetiapine 25 mg at night   Daily routine: Diet:  Exercise: Support: Household:  Marital status: Number of children: Employment:  Education:   Last PCP / ongoing medical evaluation:         Associated Signs/Symptoms: Depression Symptoms:  {DEPRESSION SYMPTOMS:20000} (Hypo) Manic Symptoms:  {BHH MANIC SYMPTOMS:22872} Anxiety Symptoms:  {BHH ANXIETY SYMPTOMS:22873} Psychotic Symptoms:  {BHH PSYCHOTIC SYMPTOMS:22874} PTSD Symptoms: {BHH PTSD SYMPTOMS:22875}  Past Psychiatric History:  Outpatient:  Psychiatry admission:  Previous suicide attempt:  Past trials of medication:  History of violence:    Previous Psychotropic Medications: {YES/NO:21197}  Substance Abuse History in the last 12 months:  {yes no:314532}  Consequences of Substance Abuse: {BHH CONSEQUENCES OF SUBSTANCE ABUSE:22880}  Past Medical History:  Past Medical History:  Diagnosis Date   Allergy    Seasonal   GERD (gastroesophageal reflux disease)    History of kidney stones    x4   Insomnia     Past Surgical History:  Procedure Laterality Date   FACIAL RECONSTRUCTION SURGERY  01/13/2008   After a car fell on him while working on it    Family Psychiatric History: ***  Family History:  Family History  Problem Relation Age of Onset   Varicose Veins Mother    Endometriosis Mother    Drug abuse Father    GI Disease Father    Heart attack Maternal Grandmother    Breast cancer Maternal Grandmother    Thyroid cancer Maternal Grandmother    Congestive Heart Failure Maternal  Grandfather    Heart attack Maternal Grandfather    Stroke Maternal Grandfather    Breast cancer Paternal Grandmother    Heart attack Paternal Grandfather     Social History:   Social History   Socioeconomic History   Marital status: Married    Spouse name: Cala BradfordKimberly    Number of children: 1   Years of education: Not on file   Highest education level: Some college, no degree  Occupational History   Not on file  Tobacco Use   Smoking status: Former    Packs/day: 0.25    Years: 15.00    Pack years: 3.75    Types: Cigarettes, E-cigarettes    Start date: 11/10/1999    Quit date: 06/05/2017    Years since quitting: 4.0   Smokeless tobacco: Never   Tobacco comments:    e cig mostly  Vaping Use   Vaping Use: Every day   Start date: 11/28/2011   Substances: Nicotine, Flavoring   Devices: Box Mod  Substance and Sexual Activity   Alcohol use: No    Alcohol/week: 0.0 standard drinks   Drug use: No   Sexual activity: Yes    Partners: Female  Other Topics Concern   Not on file  Social History Narrative   He is married has one son.    He was addicted to video games, but is back playing guitar    Social Determinants of Health   Financial Resource Strain: Low Risk    Difficulty of Paying Living Expenses: Not hard at all  Food  Insecurity: No Food Insecurity   Worried About Programme researcher, broadcasting/film/video in the Last Year: Never true   Ran Out of Food in the Last Year: Never true  Transportation Needs: No Transportation Needs   Lack of Transportation (Medical): No   Lack of Transportation (Non-Medical): No  Physical Activity: Insufficiently Active   Days of Exercise per Week: 2 days   Minutes of Exercise per Session: 60 min  Stress: Stress Concern Present   Feeling of Stress : Very much  Social Connections: Moderately Integrated   Frequency of Communication with Friends and Family: Three times a week   Frequency of Social Gatherings with Friends and Family: Once a week   Attends  Religious Services: Never   Database administrator or Organizations: Yes   Attends Engineer, structural: More than 4 times per year   Marital Status: Married    Additional Social History: ***  Allergies:   Allergies  Allergen Reactions   Bee Venom Anaphylaxis, Shortness Of Breath and Swelling   Penicillins Hives    vasculitis    Metabolic Disorder Labs: Lab Results  Component Value Date   HGBA1C 5.4 03/22/2021   MPG 108 03/22/2021   MPG 117 01/14/2020   No results found for: PROLACTIN Lab Results  Component Value Date   CHOL 195 03/22/2021   TRIG 158 (H) 03/22/2021   HDL 48 03/22/2021   CHOLHDL 4.1 03/22/2021   VLDL 26 09/15/2016   LDLCALC 120 (H) 03/22/2021   LDLCALC 131 (H) 01/14/2020   Lab Results  Component Value Date   TSH 1.33 09/15/2016    Therapeutic Level Labs: No results found for: LITHIUM No results found for: CBMZ No results found for: VALPROATE  Current Medications: Current Outpatient Medications  Medication Sig Dispense Refill   EPINEPHrine 0.3 mg/0.3 mL IJ SOAJ injection Inject 0.3 mg into the muscle as needed for anaphylaxis. 2 each 0   escitalopram (LEXAPRO) 10 MG tablet Take 10 mg by mouth daily.     fluticasone (FLONASE) 50 MCG/ACT nasal spray SHAKE LIQUID AND USE 2 SPRAYS IN EACH NOSTRIL DAILY 16 g 0   omeprazole (PRILOSEC) 20 MG capsule Take 1 capsule (20 mg total) by mouth daily. 90 capsule 0   QUEtiapine (SEROQUEL) 25 MG tablet TAKE 1 TABLET(25 MG) BY MOUTH AT BEDTIME 90 tablet 0   No current facility-administered medications for this visit.    Musculoskeletal: Strength & Muscle Tone:  N/A Gait & Station:  N/A Patient leans: N/A  Psychiatric Specialty Exam: Review of Systems  There were no vitals taken for this visit.There is no height or weight on file to calculate BMI.  General Appearance: {Appearance:22683}  Eye Contact:  {BHH EYE CONTACT:22684}  Speech:  Clear and Coherent  Volume:  Normal  Mood:  {BHH MOOD:22306}   Affect:  {Affect (PAA):22687}  Thought Process:  Coherent  Orientation:  Full (Time, Place, and Person)  Thought Content:  Logical  Suicidal Thoughts:  {ST/HT (PAA):22692}  Homicidal Thoughts:  {ST/HT (PAA):22692}  Memory:  Immediate;   Good  Judgement:  {Judgement (PAA):22694}  Insight:  {Insight (PAA):22695}  Psychomotor Activity:  Normal  Concentration:  Concentration: Good and Attention Span: Good  Recall:  Good  Fund of Knowledge:Good  Language: Good  Akathisia:  No  Handed:  Right  AIMS (if indicated):  not done  Assets:  Communication Skills Desire for Improvement  ADL's:  Intact  Cognition: WNL  Sleep:  {BHH GOOD/FAIR/POOR:22877}   Screenings: GAD-7  Flowsheet Row Office Visit from 05/25/2021 in [facility] Office Visit from 05/23/2021 in [facility] Office Visit from 10/29/2019 in [facility] Office Visit from 01/08/2018 in [facility] Office Visit from 10/17/2017 in [facility]  Total GAD-7 Score 19 21 10 6 6       PHQ2-9    Flowsheet Row Office Visit from 05/25/2021 in [facility] Office Visit from 05/23/2021 in [facility] Office Visit from 04/19/2021 in [facility] Office Visit from 03/22/2021 in [facility] Office Visit from 01/07/2021 in [facility]  PHQ-2 Total Score 5 4 0 0 0  PHQ-9 Total Score 17 20 0 0 0      Flowsheet Row ED from 11/22/2020 in [facility]  ED from 02/15/2020 in [facility]  ED from 02/11/2020 in [facility]   C-SSRS RISK CATEGORY No Risk No Risk Low Risk       Assessment and Plan:  Assessment  Plan   The patient demonstrates the following risk factors for suicide: Chronic risk factors for suicide include: {Chronic Risk Factors for [facility]. Acute risk factors for suicide  include: {Acute Risk Factors for ZOXWRUE:45409811}. Protective factors for this patient include: {Protective Factors for Suicide BJYNWGN:56213086}. Considering these factors, the overall suicide risk at this point appears to be {Desc; low/moderate/high:110033}. Patient {ACTION; IS/IS VHQI:69629528} appropriate for outpatient follow up.       Collaboration of Care: {BH OP Collaboration of Care:21014065}  Patient/Guardian was advised Release of Information must be obtained prior to any record release in order to collaborate their care with an outside provider. Patient/Guardian was advised if they have not already done so to contact the registration department to sign all necessary forms in order for UXL:24401027} to release information regarding their care.   Consent: Patient/Guardian gives verbal consent for treatment and assignment of benefits for services provided during this visit. Patient/Guardian expressed understanding and agreed to proceed.   [place], MD 6/5/20239:13 AM

## 2021-06-28 ENCOUNTER — Telehealth: Payer: Self-pay | Admitting: Psychiatry

## 2021-06-28 NOTE — Telephone Encounter (Signed)
Contacted patient to inquire about receipt/completion of NPPW. He stated he meant to call and cancel the appointment scheduled for 06/29/2021. He declined to reschedule or provide a reason for cancelling.

## 2021-06-29 ENCOUNTER — Ambulatory Visit: Payer: Self-pay | Admitting: Psychiatry

## 2021-07-08 ENCOUNTER — Other Ambulatory Visit: Payer: Self-pay | Admitting: Family Medicine

## 2021-07-08 DIAGNOSIS — J301 Allergic rhinitis due to pollen: Secondary | ICD-10-CM

## 2021-07-17 ENCOUNTER — Other Ambulatory Visit: Payer: Self-pay | Admitting: Family Medicine

## 2021-07-17 DIAGNOSIS — K219 Gastro-esophageal reflux disease without esophagitis: Secondary | ICD-10-CM

## 2021-11-02 ENCOUNTER — Ambulatory Visit (INDEPENDENT_AMBULATORY_CARE_PROVIDER_SITE_OTHER): Payer: BC Managed Care – PPO | Admitting: Nurse Practitioner

## 2021-11-02 ENCOUNTER — Other Ambulatory Visit: Payer: Self-pay

## 2021-11-02 ENCOUNTER — Encounter: Payer: Self-pay | Admitting: Nurse Practitioner

## 2021-11-02 VITALS — BP 128/76 | HR 110 | Temp 97.7°F | Resp 16 | Ht 74.0 in | Wt 245.8 lb

## 2021-11-02 DIAGNOSIS — R42 Dizziness and giddiness: Secondary | ICD-10-CM | POA: Diagnosis not present

## 2021-11-02 DIAGNOSIS — R5383 Other fatigue: Secondary | ICD-10-CM | POA: Diagnosis not present

## 2021-11-02 DIAGNOSIS — R52 Pain, unspecified: Secondary | ICD-10-CM | POA: Diagnosis not present

## 2021-11-02 LAB — POCT INFLUENZA A/B
Influenza A, POC: NEGATIVE
Influenza B, POC: NEGATIVE

## 2021-11-02 MED ORDER — MECLIZINE HCL 25 MG PO TABS
25.0000 mg | ORAL_TABLET | Freq: Three times a day (TID) | ORAL | 0 refills | Status: DC | PRN
Start: 2021-11-02 — End: 2022-09-21

## 2021-11-02 NOTE — Progress Notes (Signed)
BP 128/76   Pulse (!) 110   Temp 97.7 F (36.5 C) (Oral)   Resp 16   Ht 6\' 2"  (1.88 m)   Wt 245 lb 12.8 oz (111.5 kg)   SpO2 98%   BMI 31.56 kg/m    Subjective:    Patient ID: Timothy Fleming, male    DOB: [birth year], 36 y.o.   MRN: [id number]  HPI: Timothy Fleming is a 36 y.o. male  Chief Complaint  Patient presents with   Dizziness   Fatigue    Bodyaches for 4 days   Dizziness/fatigue/body aches:  He reports symptoms started on Sunday.  He says it started right ear pain but then that resolved he started having dizziness, body aches and fatigue.  He denies any fever, nasal congestion or cough.  Denies any shortness of breath or abdominal pain.  He says he has tried pepto and xyzal with no improvement.  Discussed with patient to start taking Flonase.  We will get COVID PCR and rapid flu.  We will also get labs.  Discussed with patient antiviral treatment if positive.  Patient is interested in treatment.  We will send in meclizine for dizziness.  Relevant past medical, surgical, family and social history reviewed and updated as indicated. Interim medical history since our last visit reviewed. Allergies and medications reviewed and updated.  Review of Systems  Constitutional: Negative for fever or weight change.  Positive for body aches and fatigue Respiratory: Negative for cough and shortness of breath.   Cardiovascular: Negative for chest pain or palpitations.  Gastrointestinal: Negative for abdominal pain, no bowel changes.  Musculoskeletal: Negative for gait problem or joint swelling.  Skin: Negative for rash.  Neurological: Positive for dizziness or negative headache.  No other specific complaints in a complete review of systems (except as listed in HPI above).      Objective:    BP 128/76   Pulse (!) 110   Temp 97.7 F (36.5 C) (Oral)   Resp 16   Ht 6\' 2"  (1.88 m)   Wt 245 lb 12.8 oz (111.5 kg)   SpO2 98%   BMI 31.56 kg/m   Wt Readings from Last 3 Encounters:   11/02/21 245 lb 12.8 oz (111.5 kg)  05/25/21 237 lb (107.5 kg)  05/23/21 238 lb 1.6 oz (108 kg)    Physical Exam  Constitutional: Patient appears ill, well-developed and well-nourished. Obese  No distress.  HEENT: head atraumatic, normocephalic, pupils equal and reactive to light, ears TMs effusions, neck supple, throat within normal limits Cardiovascular: Tachycardic rate, regular rhythm and normal heart sounds.  No murmur heard. No BLE edema. Pulmonary/Chest: Effort normal and breath sounds normal. No respiratory distress. Abdominal: Soft.  There is no tenderness. Psychiatric: Patient has a normal mood and affect. behavior is normal. Judgment and thought content normal.  Results for orders placed or performed in visit on 11/02/21  POCT Influenza A/B  Result Value Ref Range   Influenza A, POC Negative Negative   Influenza B, POC Negative Negative      Assessment & Plan:   Problem List Items Addressed This Visit   None Visit Diagnoses     Dizziness    -  Primary   Can take meclizine for dizziness, push fluids.  Start Flonase.  We will also be getting labs, COVID PCR and flu test   Relevant Medications   meclizine (ANTIVERT) 25 MG tablet   Other Relevant Orders   CBC with Differential/Platelet  COMPLETE METABOLIC PANEL WITH GFR   TSH   POCT Influenza A/B (Completed)   Novel Coronavirus, NAA ([facility])   Other fatigue        push fluids.  Start Flonase.  We will also be getting labs, COVID PCR and flu test   Relevant Orders   CBC with Differential/Platelet   COMPLETE METABOLIC PANEL WITH GFR   TSH   POCT Influenza A/B (Completed)   Novel Coronavirus, NAA ([facility])   Body aches        push fluids.  Start Flonase.  We will also be getting labs, COVID PCR and flu test   Relevant Orders   CBC with Differential/Platelet   COMPLETE METABOLIC PANEL WITH GFR   TSH   POCT Influenza A/B (Completed)   Novel Coronavirus, NAA ([facility])        Follow up plan: Return if  symptoms worsen or fail to improve.

## 2021-11-03 ENCOUNTER — Encounter: Payer: Self-pay | Admitting: Nurse Practitioner

## 2021-11-03 LAB — COMPLETE METABOLIC PANEL WITH GFR
AG Ratio: 1.7 (calc) (ref 1.0–2.5)
ALT: 16 U/L (ref 9–46)
AST: 16 U/L (ref 10–40)
Albumin: 4.8 g/dL (ref 3.6–5.1)
Alkaline phosphatase (APISO): 69 U/L (ref 36–130)
BUN: 13 mg/dL (ref 7–25)
CO2: 29 mmol/L (ref 20–32)
Calcium: 10 mg/dL (ref 8.6–10.3)
Chloride: 103 mmol/L (ref 98–110)
Creat: 1.06 mg/dL (ref 0.60–1.26)
Globulin: 2.8 g/dL (calc) (ref 1.9–3.7)
Glucose, Bld: 80 mg/dL (ref 65–99)
Potassium: 4.1 mmol/L (ref 3.5–5.3)
Sodium: 140 mmol/L (ref 135–146)
Total Bilirubin: 0.8 mg/dL (ref 0.2–1.2)
Total Protein: 7.6 g/dL (ref 6.1–8.1)
eGFR: 93 mL/min/{1.73_m2} (ref >=60)

## 2021-11-03 LAB — CBC WITH DIFFERENTIAL/PLATELET
Absolute Monocytes: 1007 cells/uL — ABNORMAL HIGH (ref 200–950)
Basophils Absolute: 67 cells/uL (ref 0–200)
Basophils Relative: 0.7 %
Eosinophils Absolute: 247 cells/uL (ref 15–500)
Eosinophils Relative: 2.6 %
HCT: 47.2 % (ref 38.5–50.0)
Hemoglobin: 16.2 g/dL (ref 13.2–17.1)
Lymphs Abs: 2499 cells/uL (ref 850–3900)
MCH: 29.8 pg (ref 27.0–33.0)
MCHC: 34.3 g/dL (ref 32.0–36.0)
MCV: 86.9 fL (ref 80.0–100.0)
MPV: 9.7 fL (ref 7.5–12.5)
Monocytes Relative: 10.6 %
Neutro Abs: 5681 cells/uL (ref 1500–7800)
Neutrophils Relative %: 59.8 %
Platelets: 355 10*3/uL (ref 140–400)
RBC: 5.43 10*6/uL (ref 4.20–5.80)
RDW: 12.2 % (ref 11.0–15.0)
Total Lymphocyte: 26.3 %
WBC: 9.5 10*3/uL (ref 3.8–10.8)

## 2021-11-03 LAB — TSH: TSH: 1.2 mIU/L (ref 0.40–4.50)

## 2021-11-03 LAB — SPECIMEN STATUS REPORT

## 2021-11-03 LAB — NOVEL CORONAVIRUS, NAA: SARS-CoV-2, NAA: NOT DETECTED

## 2021-11-06 ENCOUNTER — Encounter: Payer: Self-pay | Admitting: Nurse Practitioner

## 2021-11-07 ENCOUNTER — Telehealth: Payer: Self-pay

## 2021-11-07 NOTE — Telephone Encounter (Signed)
Copied from [place] [phone]. Topic: General - Other >> Nov 07, 2021 10:15 AM [place] B wrote: Reason for CRM: Patient called in for Dr's note he ask to be put in mychart for Wednesday Thursday and Friday of last week.    Made patient aware that letter was already made 11/03/21, and that he can pick-up in office if he can't see on his MyChart.

## 2021-12-13 ENCOUNTER — Encounter: Payer: Self-pay | Admitting: Emergency Medicine

## 2021-12-13 ENCOUNTER — Ambulatory Visit
Admission: EM | Admit: 2021-12-13 | Discharge: 2021-12-13 | Disposition: A | Discharge status: Home / Self Care | Payer: BC Managed Care – PPO | Attending: Physician Assistant | Admitting: Physician Assistant

## 2021-12-13 ENCOUNTER — Ambulatory Visit: Payer: Self-pay

## 2021-12-13 ENCOUNTER — Ambulatory Visit (INDEPENDENT_AMBULATORY_CARE_PROVIDER_SITE_OTHER): Payer: BC Managed Care – PPO

## 2021-12-13 DIAGNOSIS — M25872 Other specified joint disorders, left ankle and foot: Secondary | ICD-10-CM | POA: Diagnosis not present

## 2021-12-13 DIAGNOSIS — M79672 Pain in left foot: Secondary | ICD-10-CM

## 2021-12-13 MED ORDER — PREDNISONE 10 MG PO TABS: ORAL_TABLET | ORAL | 0 refills | Status: DC

## 2021-12-13 NOTE — Discharge Instructions (Addendum)
-  The x-ray performed today shows a condition called sesamoiditis.  You may research this condition online.  And it is due to inflammation of the sesamoid bones.  The treatment is anti-inflammatory medication but since the NSAIDs have not helped, we can try a prednisone.  Continue to ice and elevate the foot.  I placed a referral to podiatry in case you are not improving he can follow-up with them.

## 2021-12-13 NOTE — Telephone Encounter (Signed)
  Chief Complaint: Foot pain  Symptoms: left foot pain  - joint in foot below great toe.  Frequency: a couple of months Pertinent Negatives: Patient denies fever, redness Disposition: [] ED /[x] Urgent Care (no appt availability in office) / [] Appointment(In office/virtual)/ []  [facility]/ [] Home Care/ [] Refused Recommended Disposition /[] [facility]/ []  Follow-up with PCP Additional Notes: PT has had foot pain for a few months. Pt has taken NSAIDs and "rice" without relief. Pt wanted to be seen today. PT will go to UC.     Reason for Disposition  [1] MODERATE pain (e.g., interferes with normal activities, limping) AND [2] present > 3 days  Answer Assessment - Initial Assessment Questions 1. ONSET: "When did the pain start?"      A couple of months 2. LOCATION: "Where is the pain located?"      Left foot below great toe 3. PAIN: "How bad is the pain?"    (Scale 1-10; or mild, moderate, severe)  - MILD (1-3): doesn't interfere with normal activities.   - MODERATE (4-7): interferes with normal activities (e.g., work or school) or awakens from sleep, limping.   - SEVERE (8-10): excruciating pain, unable to do any normal activities, unable to walk.      6-7/10 4. WORK OR EXERCISE: "Has there been any recent work or exercise that involved this part of the body?"      Helped move boss 5. CAUSE: "What do you think is causing the foot pain?"     gout 6. OTHER SYMPTOMS: "Do you have any other symptoms?" (e.g., leg pain, rash, fever, numbness)     no 7. PREGNANCY: "Is there any chance you are pregnant?" "When was your last menstrual period?"     na  Protocols used: Foot Pain-A-AH  -

## 2021-12-13 NOTE — ED Provider Notes (Signed)
[facility]    CSN: [id number] Arrival date & time: 12/13/21  0844      History   Chief Complaint Chief Complaint  Patient presents with   Foot Pain    HPI Timothy Fleming is a 36 y.o. male presenting for 12-day history of pain of the plantar/lateral aspect of the foot around the great toe/MTP joint.  Patient denies any injury.  States symptoms started after he helped someone move.  He reports that he has been following RICE guidelines and taking ibuprofen and has tried Aleve as well.  He says nothing has helped.  He reports a lot of pain when he puts weight on his foot.  He has not had any numbness, weakness or tingling.  He denies any swelling, bruising, lacerations or abrasions.  He has never had symptoms like this before.  No worsening of symptoms but no improvement either.  Patient says he does have some arthritis.  He does not have any known history of gout.  No other complaints.  HPI  Past Medical History:  Diagnosis Date   Allergy    Seasonal   GERD (gastroesophageal reflux disease)    History of kidney stones    x4   Insomnia     Patient Active Problem List   Diagnosis Date Noted   Ankle instability 12/03/2017   Metabolic syndrome 12/13/2016   Moderate major depression (HCC) 11/30/2016   History of vasculitis of skin 05/20/2015   GERD without esophagitis 11/10/2014   Seasonal allergic rhinitis 11/10/2014   History of nephrolithiasis 11/10/2014   Obesity (BMI 30-39.9) 11/10/2014   Tobacco abuse 11/10/2014   Snoring 11/10/2014   Insomnia 11/10/2014    Past Surgical History:  Procedure Laterality Date   FACIAL RECONSTRUCTION SURGERY  01/13/2008   After a car fell on him while working on it       Home Medications    Prior to Admission medications   Medication Sig Start Date End Date Taking? Authorizing Provider  EPINEPHrine 0.3 mg/0.3 mL IJ SOAJ injection Inject 0.3 mg into the muscle as needed for anaphylaxis. 01/07/21  Yes Sowles, Danna Hefty,  MD  omeprazole (PRILOSEC) 20 MG capsule TAKE 1 CAPSULE(20 MG) BY MOUTH DAILY 07/17/21  Yes Sowles, Danna Hefty, MD  predniSONE (DELTASONE) 10 MG tablet Take 6 tabs p.o. on day 1 and decrease by 1 tablet daily until complete 12/13/21  Yes Eusebio Friendly B, PA-C  escitalopram (LEXAPRO) 10 MG tablet Take 10 mg by mouth daily. 05/08/21   [provider]  fluticasone (FLONASE) 50 MCG/ACT nasal spray SHAKE LIQUID AND USE 2 SPRAYS IN EACH NOSTRIL DAILY 07/08/21   Alba Cory, MD  meclizine (ANTIVERT) 25 MG tablet Take 1 tablet (25 mg total) by mouth 3 (three) times daily as needed for dizziness. 11/02/21   Berniece Salines, FNP  QUEtiapine (SEROQUEL) 25 MG tablet TAKE 1 TABLET(25 MG) BY MOUTH AT BEDTIME 05/25/21   Alba Cory, MD    Family History Family History  Problem Relation Age of Onset   Varicose Veins Mother    Endometriosis Mother    Drug abuse Father    GI Disease Father    Heart attack Maternal Grandmother    Breast cancer Maternal Grandmother    Thyroid cancer Maternal Grandmother    Congestive Heart Failure Maternal Grandfather    Heart attack Maternal Grandfather    Stroke Maternal Grandfather    Breast cancer Paternal Grandmother    Heart attack Paternal Grandfather  Social History Social History   Tobacco Use   Smoking status: Former    Packs/day: 0.25    Years: 15.00    Total pack years: 3.75    Types: Cigarettes, E-cigarettes    Start date: 11/10/1999    Quit date: 06/05/2017    Years since quitting: 4.5   Smokeless tobacco: Never   Tobacco comments:    e cig mostly  Vaping Use   Vaping Use: Every day   Start date: 11/28/2011   Substances: Nicotine, Flavoring   Devices: Box Mod  Substance Use Topics   Alcohol use: No    Alcohol/week: 0.0 standard drinks of alcohol   Drug use: No     Allergies   Bee venom and Penicillins   Review of Systems Review of Systems  Musculoskeletal:  Positive for arthralgias. Negative for gait problem and joint  swelling.  Skin:  Negative for color change and wound.  Neurological:  Negative for weakness and numbness.     Physical Exam Triage Vital Signs ED Triage Vitals  Enc Vitals Group     BP 12/13/21 0913 131/77     Pulse Rate 12/13/21 0913 74     Resp 12/13/21 0913 16     Temp 12/13/21 0913 97.9 F (36.6 C)     Temp Source 12/13/21 0913 Oral     SpO2 12/13/21 0913 98 %     Weight --      Height --      Head Circumference --      Peak Flow --      Pain Score 12/13/21 0911 6     Pain Loc --      Pain Edu? --      Excl. in GC? --    No data found.  Updated Vital Signs BP 131/77 (BP Location: Right Arm)   Pulse 74   Temp 97.9 F (36.6 C) (Oral)   Resp 16   SpO2 98%      Physical Exam Vitals and nursing note reviewed.  Constitutional:      General: He is not in acute distress.    Appearance: Normal appearance. He is well-developed. He is not ill-appearing.  HENT:     Head: Normocephalic and atraumatic.  Eyes:     General: No scleral icterus.    Conjunctiva/sclera: Conjunctivae normal.  Cardiovascular:     Rate and Rhythm: Normal rate.     Pulses: Normal pulses.  Pulmonary:     Effort: Pulmonary effort is normal. No respiratory distress.  Musculoskeletal:     Cervical back: Neck supple.     Left foot: Normal range of motion. Bony tenderness (TTP 1st MTP and plantar 1st metatarsal) present. No swelling. Normal pulse.  Skin:    General: Skin is warm and dry.     Capillary Refill: Capillary refill takes less than 2 seconds.  Neurological:     General: No focal deficit present.     Mental Status: He is alert. Mental status is at baseline.     Motor: No weakness.     Coordination: Coordination normal.     Gait: Gait normal.  Psychiatric:        Mood and Affect: Mood normal.        Behavior: Behavior normal.      UC Treatments / Results  Labs (all labs ordered are listed, but only abnormal results are displayed) Labs Reviewed - No data to  display  EKG   Radiology DG Foot Complete Left  Result Date: 12/13/2021 CLINICAL DATA:  Two-week history of left foot pain near the great toe metatarsophalangeal joint EXAM: LEFT FOOT - COMPLETE 3 VIEW COMPARISON:  None Available. FINDINGS: There is no evidence of fracture or dislocation. Subjective sclerosis of the hallux sesamoids. Soft tissues are unremarkable. IMPRESSION: 1. No acute fracture or dislocation. 2. Subjective sclerosis of the hallux sesamoids, which can be seen in the setting of sesamoiditis. Electronically Signed   By: Agustin Cree M.D.   On: 12/13/2021 09:45    Procedures Procedures (including critical care time)  Medications Ordered in UC Medications - No data to display  Initial Impression / Assessment and Plan / UC Course  I have reviewed the triage vital signs and the nursing notes.  Pertinent labs & imaging results that were available during my care of the patient were reviewed by me and considered in my medical decision making (see chart for details).   36 year old male presents for atraumatic pain of the left foot for the past 12 days.  No improvement with following RICE guidelines and taking anti-inflammatory medication.  An x-ray performed today shows suspected sesamoiditis of the great toe.  Since he has failed NSAIDs, will step up to prednisone.  Also advised to continue RICE guidelines.  I placed a referral to podiatry in case he is not starting to improve with the corticosteroids.  Follow-up here as needed.  Work note given.   Final Clinical Impressions(s) / UC Diagnoses   Final diagnoses:  Sesamoiditis of left foot  Foot pain, left     Discharge Instructions      -The x-ray performed today shows a condition called sesamoiditis.  You may research this condition online.  And it is due to inflammation of the sesamoid bones.  The treatment is anti-inflammatory medication but since the NSAIDs have not helped, we can try a prednisone.  Continue to ice and  elevate the foot.  I placed a referral to podiatry in case you are not improving he can follow-up with them.   ED Prescriptions     Medication Sig Dispense Auth. Provider   predniSONE (DELTASONE) 10 MG tablet Take 6 tabs p.o. on day 1 and decrease by 1 tablet daily until complete 21 tablet Shirlee Latch, PA-C      PDMP not reviewed this encounter.   Shirlee Latch, PA-C 12/13/21 1023

## 2021-12-13 NOTE — ED Triage Notes (Signed)
Pt presents with left foot pain near his big toe x 2 weeks. Pt denies any injury.

## 2021-12-27 NOTE — Progress Notes (Deleted)
Name: Timothy Fleming   MRN: [id number]    DOB: 21-Apr-1985   Date:12/27/2021       Progress Note  Subjective  Chief Complaint  Foot Pain  HPI  *** Patient Active Problem List   Diagnosis Date Noted   Ankle instability 21/97/5883   Metabolic syndrome 25/49/8264   Moderate major depression ([place]) 11/30/2016   History of vasculitis of skin 05/20/2015   GERD without esophagitis 11/10/2014   Seasonal allergic rhinitis 11/10/2014   History of nephrolithiasis 11/10/2014   Obesity (BMI 30-39.9) 11/10/2014   Tobacco abuse 11/10/2014   Snoring 11/10/2014   Insomnia 11/10/2014    Past Surgical History:  Procedure Laterality Date   FACIAL RECONSTRUCTION SURGERY  01/13/2008   After a car fell on him while working on it    Family History  Problem Relation Age of Onset   Varicose Veins Mother    Endometriosis Mother    Drug abuse Father    GI Disease Father    Heart attack Maternal Grandmother    Breast cancer Maternal Grandmother    Thyroid cancer Maternal Grandmother    Congestive Heart Failure Maternal Grandfather    Heart attack Maternal Grandfather    Stroke Maternal Grandfather    Breast cancer Paternal Grandmother    Heart attack Paternal Grandfather     Social History   Tobacco Use   Smoking status: Former    Packs/day: 0.25    Years: 15.00    Total pack years: 3.75    Types: Cigarettes, E-cigarettes    Start date: 11/10/1999    Quit date: 06/05/2017    Years since quitting: 4.5   Smokeless tobacco: Never   Tobacco comments:    e cig mostly  Substance Use Topics   Alcohol use: No    Alcohol/week: 0.0 standard drinks of alcohol     Current Outpatient Medications:    EPINEPHrine 0.3 mg/0.3 mL IJ SOAJ injection, Inject 0.3 mg into the muscle as needed for anaphylaxis., Disp: 2 each, Rfl: 0   escitalopram (LEXAPRO) 10 MG tablet, Take 10 mg by mouth daily., Disp: , Rfl:    fluticasone (FLONASE) 50 MCG/ACT nasal spray, SHAKE LIQUID AND USE 2 SPRAYS IN EACH  NOSTRIL DAILY, Disp: 16 g, Rfl: 0   meclizine (ANTIVERT) 25 MG tablet, Take 1 tablet (25 mg total) by mouth 3 (three) times daily as needed for dizziness., Disp: 30 tablet, Rfl: 0   omeprazole (PRILOSEC) 20 MG capsule, TAKE 1 CAPSULE(20 MG) BY MOUTH DAILY, Disp: 90 capsule, Rfl: 0   predniSONE (DELTASONE) 10 MG tablet, Take 6 tabs p.o. on day 1 and decrease by 1 tablet daily until complete, Disp: 21 tablet, Rfl: 0   QUEtiapine (SEROQUEL) 25 MG tablet, TAKE 1 TABLET(25 MG) BY MOUTH AT BEDTIME, Disp: 90 tablet, Rfl: 0  Allergies  Allergen Reactions   Bee Venom Anaphylaxis, Shortness Of Breath and Swelling   Penicillins Hives and Other (See Comments)    vasculitis    I personally reviewed active problem list, medication list, allergies, family history, social history, health maintenance with the patient/caregiver today.   ROS  ***  Objective  There were no vitals filed for this visit.  There is no height or weight on file to calculate BMI.  Physical Exam ***  Recent Results (from the past 2160 hour(s))  Novel Coronavirus, NAA ([facility])     Status: None   Collection Time: 11/02/21 12:00 AM   Specimen: Nasopharyngeal(NP) swabs in vial transport medium  Nasopharynge  Previous  Result Value Ref Range   SARS-CoV-2, NAA Not Detected Not Detected    Comment: This nucleic acid amplification test was developed and its performance characteristics determined by [employer]. Nucleic acid amplification tests include RT-PCR and TMA. This test has not been FDA cleared or approved. This test has been authorized by FDA under an Emergency Use Authorization (EUA). This test is only authorized for the duration of time the declaration that circumstances exist justifying the authorization of the emergency use of in vitro diagnostic tests for detection of SARS-CoV-2 virus and/or diagnosis of COVID-19 infection under section 564(b)(1) of the Act, 21 U.S.C. 800LKJ-1(P) (1), unless the  authorization is terminated or revoked sooner. When diagnostic testing is negative, the possibility of a false negative result should be considered in the context of a patient's recent exposures and the presence of clinical signs and symptoms consistent with COVID-19. An individual without symptoms of COVID-19 and who is not shedding SARS-CoV-2 virus wo uld expect to have a negative (not detected) result in this assay.   Specimen status report     Status: None   Collection Time: 11/02/21 12:00 AM  Result Value Ref Range   specimen status report Comment     Comment: Please note Please note The date and/or time of collection was not indicated on the requisition as required by state and federal law.  The date of receipt of the specimen was used as the collection date if not supplied.   POCT Influenza A/B     Status: None   Collection Time: 11/02/21  2:05 PM  Result Value Ref Range   Influenza A, POC Negative Negative   Influenza B, POC Negative Negative  CBC with Differential/Platelet     Status: Abnormal   Collection Time: 11/02/21  2:07 PM  Result Value Ref Range   WBC 9.5 3.8 - 10.8 Thousand/uL   RBC 5.43 4.20 - 5.80 Million/uL   Hemoglobin 16.2 13.2 - 17.1 g/dL   HCT 47.2 38.5 - 50.0 %   MCV 86.9 80.0 - 100.0 fL   MCH 29.8 27.0 - 33.0 pg   MCHC 34.3 32.0 - 36.0 g/dL   RDW 12.2 11.0 - 15.0 %   Platelets 355 140 - 400 Thousand/uL   MPV 9.7 7.5 - 12.5 fL   Neutro Abs 5,681 1,500 - 7,800 cells/uL   Lymphs Abs 2,499 850 - 3,900 cells/uL   Absolute Monocytes 1,007 (H) 200 - 950 cells/uL   Eosinophils Absolute 247 15 - 500 cells/uL   Basophils Absolute 67 0 - 200 cells/uL   Neutrophils Relative % 59.8 %   Total Lymphocyte 26.3 %   Monocytes Relative 10.6 %   Eosinophils Relative 2.6 %   Basophils Relative 0.7 %  COMPLETE METABOLIC PANEL WITH GFR     Status: None   Collection Time: 11/02/21  2:07 PM  Result Value Ref Range   Glucose, Bld 80 65 - 99 mg/dL    Comment: .             Fasting reference interval .    BUN 13 7 - 25 mg/dL   Creat 1.06 0.60 - 1.26 mg/dL   eGFR 93 > OR = 60 mL/min/1.80m   BUN/Creatinine Ratio SEE NOTE: 6 - 22 (calc)    Comment:    Not Reported: BUN and Creatinine are within    reference range. .    Sodium 140 135 - 146 mmol/L   Potassium 4.1 3.5 - 5.3  mmol/L   Chloride 103 98 - 110 mmol/L   CO2 29 20 - 32 mmol/L   Calcium 10.0 8.6 - 10.3 mg/dL   Total Protein 7.6 6.1 - 8.1 g/dL   Albumin 4.8 3.6 - 5.1 g/dL   Globulin 2.8 1.9 - 3.7 g/dL (calc)   AG Ratio 1.7 1.0 - 2.5 (calc)   Total Bilirubin 0.8 0.2 - 1.2 mg/dL   Alkaline phosphatase (APISO) 69 36 - 130 U/L   AST 16 10 - 40 U/L   ALT 16 9 - 46 U/L  TSH     Status: None   Collection Time: 11/02/21  2:07 PM  Result Value Ref Range   TSH 1.20 0.40 - 4.50 mIU/L    PHQ2/9:    11/02/2021    1:36 PM 05/25/2021    1:48 PM 05/23/2021   10:50 AM 04/19/2021    2:04 PM 03/22/2021    1:40 PM  Depression screen PHQ 2/9  Decreased Interest 0 2 1 0 0  Down, Depressed, Hopeless 0 3 3 0 0  PHQ - 2 Score 0 5 4 0 0  Altered sleeping  3 3 0 0  Tired, decreased energy  2 3 0 0  Change in appetite  3 3 0 0  Feeling bad or failure about yourself   2 3 0 0  Trouble concentrating  1 1 0 0  Moving slowly or fidgety/restless  1 3 0 0  Suicidal thoughts  0 0 0 0  PHQ-9 Score  17 20 0 0  Difficult doing work/chores  Very difficult Extremely dIfficult  Not difficult at all    phq 9 is {gen pos [id number]}   Fall Risk:    11/02/2021    1:36 PM 05/25/2021    1:48 PM 05/23/2021   10:41 AM 04/19/2021    2:04 PM 03/22/2021    1:40 PM  Fall Risk   Falls in the past year? 0 0 0 0 0  Number falls in past yr: 0   0 0  Injury with Fall? 0   0 0  Risk for fall due to :  No Fall Risks No Fall Risks No Fall Risks No Fall Risks  Follow up Falls evaluation completed Falls prevention discussed Falls prevention discussed Falls prevention discussed Falls prevention discussed      Functional Status  Survey:      Assessment & Plan  *** There are no diagnoses linked to this encounter.

## 2021-12-28 ENCOUNTER — Ambulatory Visit: Payer: BC Managed Care – PPO | Admitting: Family Medicine

## 2022-02-17 ENCOUNTER — Ambulatory Visit: Payer: Self-pay | Admitting: *Deleted

## 2022-02-17 ENCOUNTER — Encounter: Payer: Self-pay | Admitting: Nurse Practitioner

## 2022-02-17 ENCOUNTER — Ambulatory Visit: Payer: BC Managed Care – PPO | Admitting: Nurse Practitioner

## 2022-02-17 VITALS — BP 122/84 | HR 95 | Temp 97.7°F | Resp 16 | Ht 74.0 in | Wt 258.9 lb

## 2022-02-17 DIAGNOSIS — R21 Rash and other nonspecific skin eruption: Secondary | ICD-10-CM

## 2022-02-17 MED ORDER — DOXYCYCLINE HYCLATE 100 MG PO TABS: 100.0000 mg | ORAL_TABLET | Freq: Two times a day (BID) | ORAL | 0 refills | Status: AC

## 2022-02-17 MED ORDER — HYDROXYZINE PAMOATE 25 MG PO CAPS: 25.0000 mg | ORAL_CAPSULE | Freq: Three times a day (TID) | ORAL | 0 refills | Status: DC | PRN

## 2022-02-17 MED ORDER — LORATADINE 10 MG PO TABS: 10.0000 mg | ORAL_TABLET | Freq: Every day | ORAL | 0 refills | Status: DC

## 2022-02-17 NOTE — Telephone Encounter (Signed)
  Chief Complaint: Rash Symptoms: Pin point size (some bigger) red raised fluid filled rash on hands and arms. Itchy, not painful, scattered. Frequency: Yesterday Pertinent Negatives: Patient denies SOB, swelling, fever, joint pain, sore throat. No new meds except took [place] last few days for diarrhea which is resolved presently.  Disposition: [] ED /[] Urgent Care (no appt availability in office) / [x] Appointment(In office/virtual)/ []  [facility]/ [] Home Care/ [] Refused Recommended Disposition /[] [facility]/ []  Follow-up with PCP Additional Notes: Appt secured for today. Care advise provided, pt verbalizes understanding.  Reason for Disposition  Large or small blisters on skin (i.e., fluid filled bubbles or sacs)  Answer Assessment - Initial Assessment Questions 1. APPEARANCE of RASH: "Describe the rash." (e.g., spots, blisters, raised areas, skin peeling, scaly)     Fluid filled bumps, pin heads 2. SIZE: "How big are the spots?" (e.g., tip of pen, eraser, coin; inches, centimeters)     Pin head, some bigger 3. LOCATION: "Where is the rash located?"    Hands and arms 4. COLOR: "What color is the rash?" (Note: It is difficult to assess rash color in people with darker-colored skin. When this situation occurs, simply ask the caller to describe what they see.)     red 5. ONSET: "When did the rash begin?"     Yesterday 6. FEVER: "Do you have a fever?" If Yes, ask: "What is your temperature, how was it measured, and when did it start?"     no 7. ITCHING: "Does the rash itch?" If Yes, ask: "How bad is the itch?" (Scale 1-10; or mild, moderate, severe)     Yes 8. CAUSE: "What do you think is causing the rash?"     Unsure 9. MEDICINE FACTORS: "Have you started any new medicines within the last 2 weeks?" (e.g., antibiotics)      No took Pepto bismol a few days 10. OTHER SYMPTOMS: "Do you have any other symptoms?" (e.g., dizziness, headache, sore throat, joint  pain)       No, diarrhea since Wednesday.Not as bad today.  Protocols used: Rash or Redness - [facility]

## 2022-02-17 NOTE — Progress Notes (Signed)
BP 122/84   Pulse 95   Temp 97.7 F (36.5 C) (Oral)   Resp 16   Ht 6\' 2"  (1.88 m)   Wt 258 lb 14.4 oz (117.4 kg)   SpO2 98%   BMI 33.24 kg/m    Subjective:    Patient ID: Timothy Fleming, male    DOB: July 03, 1985, 37 y.o.   MRN: [id number]  HPI: Timothy Fleming is a 37 y.o. male  Chief Complaint  Patient presents with   Allergic Reaction    Arms onset 2 days   Rash: he has a rash on bilateral arms.  He denies any new medications or soaps. He says he has not been outside and has not been around chemicals.  They appear to be pustular  in nature.  He reports it is pruritic.  It is not in the webs of the fingers. Discussed case with Dr. Ancil Boozer will do doxycycline, Claritin and hydroxyzine. Discussed he can also [place] hydrocortisone cream.    Relevant past medical, surgical, family and social history reviewed and updated as indicated. Interim medical history since our last visit reviewed. Allergies and medications reviewed and updated.  Review of Systems  Constitutional: Negative for fever or weight change.  Respiratory: Negative for cough and shortness of breath.   Cardiovascular: Negative for chest pain or palpitations.  Gastrointestinal: Negative for abdominal pain, no bowel changes.  Musculoskeletal: Negative for gait problem or joint swelling.  Skin: positive for rash.  Neurological: Negative for dizziness or headache.  No other specific complaints in a complete review of systems (except as listed in HPI above).      Objective:    BP 122/84   Pulse 95   Temp 97.7 F (36.5 C) (Oral)   Resp 16   Ht 6\' 2"  (1.88 m)   Wt 258 lb 14.4 oz (117.4 kg)   SpO2 98%   BMI 33.24 kg/m   Wt Readings from Last 3 Encounters:  02/17/22 258 lb 14.4 oz (117.4 kg)  11/02/21 245 lb 12.8 oz (111.5 kg)  05/25/21 237 lb (107.5 kg)    Physical Exam  Constitutional: Patient appears well-developed and well-nourished. Obese  No distress.  HEENT: head atraumatic, normocephalic, pupils equal  and reactive to light, neck supple Cardiovascular: Normal rate, regular rhythm and normal heart sounds.  No murmur heard. No BLE edema. Pulmonary/Chest: Effort normal and breath sounds normal. No respiratory distress. Abdominal: Soft.  There is no tenderness. SKIN: pustula rash on bilateral arms, not in webs of fingers.  Psychiatric: Patient has a normal mood and affect. behavior is normal. Judgment and thought content normal.  Results for orders placed or performed in visit on 11/02/21  Novel Coronavirus, NAA ([facility])   Specimen: Nasopharyngeal(NP) swabs in vial transport medium   Nasopharynge  Previous  Result Value Ref Range   SARS-CoV-2, NAA Not Detected Not Detected  CBC with Differential/Platelet  Result Value Ref Range   WBC 9.5 3.8 - 10.8 Thousand/uL   RBC 5.43 4.20 - 5.80 Million/uL   Hemoglobin 16.2 13.2 - 17.1 g/dL   HCT 47.2 38.5 - 50.0 %   MCV 86.9 80.0 - 100.0 fL   MCH 29.8 27.0 - 33.0 pg   MCHC 34.3 32.0 - 36.0 g/dL   RDW 12.2 11.0 - 15.0 %   Platelets 355 140 - 400 Thousand/uL   MPV 9.7 7.5 - 12.5 fL   Neutro Abs 5,681 1,500 - 7,800 cells/uL   Lymphs Abs 2,499 850 - 3,900 cells/uL  Absolute Monocytes 1,007 (H) 200 - 950 cells/uL   Eosinophils Absolute 247 15 - 500 cells/uL   Basophils Absolute 67 0 - 200 cells/uL   Neutrophils Relative % 59.8 %   Total Lymphocyte 26.3 %   Monocytes Relative 10.6 %   Eosinophils Relative 2.6 %   Basophils Relative 0.7 %  COMPLETE METABOLIC PANEL WITH GFR  Result Value Ref Range   Glucose, Bld 80 65 - 99 mg/dL   BUN 13 7 - 25 mg/dL   Creat 1.06 0.60 - 1.26 mg/dL   eGFR 93 > OR = 60 mL/min/1.87m2   BUN/Creatinine Ratio SEE NOTE: 6 - 22 (calc)   Sodium 140 135 - 146 mmol/L   Potassium 4.1 3.5 - 5.3 mmol/L   Chloride 103 98 - 110 mmol/L   CO2 29 20 - 32 mmol/L   Calcium 10.0 8.6 - 10.3 mg/dL   Total Protein 7.6 6.1 - 8.1 g/dL   Albumin 4.8 3.6 - 5.1 g/dL   Globulin 2.8 1.9 - 3.7 g/dL (calc)   AG Ratio 1.7 1.0 - 2.5 (calc)    Total Bilirubin 0.8 0.2 - 1.2 mg/dL   Alkaline phosphatase (APISO) 69 36 - 130 U/L   AST 16 10 - 40 U/L   ALT 16 9 - 46 U/L  TSH  Result Value Ref Range   TSH 1.20 0.40 - 4.50 mIU/L  Specimen status report  Result Value Ref Range   specimen status report Comment   POCT Influenza A/B  Result Value Ref Range   Influenza A, POC Negative Negative   Influenza B, POC Negative Negative      Assessment & Plan:   Problem List Items Addressed This Visit   None Visit Diagnoses     Rash    -  Primary   can use hydrocortisone cream,  start claritin, hydroxyzine and doxycycline   Relevant Medications   hydrOXYzine (VISTARIL) 25 MG capsule   doxycycline (VIBRA-TABS) 100 MG tablet   loratadine (CLARITIN) 10 MG tablet        Follow up plan: Return if symptoms worsen or fail to improve.

## 2022-09-20 NOTE — Progress Notes (Unsigned)
Name: Timothy Fleming   MRN: [id number]    DOB: 05-23-1985   Date:09/21/2022       Progress Note  Subjective  Chief Complaint  Annual Exam  HPI  Patient presents for annual CPE .  IPSS Questionnaire (AUA-7): Over the past month.   1)  How often have you had a sensation of not emptying your bladder completely after you finish urinating?  0 - Not at all  2)  How often have you had to urinate again less than two hours after you finished urinating? 0 - Not at all  3)  How often have you found you stopped and started again several times when you urinated?  0 - Not at all  4) How difficult have you found it to postpone urination?  0 - Not at all  5) How often have you had a weak urinary stream?  0 - Not at all  6) How often have you had to push or strain to begin urination?  0 - Not at all  7) How many times did you most typically get up to urinate from the time you went to bed until the time you got up in the morning?  1 - 1 time  Total score:  0-7 mildly symptomatic   8-19 moderately symptomatic   20-35 severely symptomatic     Diet: cooks at home almost every night Exercise: he has a physical job  Last Dental Exam: he is due for dental exam  Last Eye Exam: up to date   Depression: phq 9 is negative    09/21/2022    8:05 AM 02/17/2022    1:40 PM 11/02/2021    1:36 PM 05/25/2021    1:48 PM 05/23/2021   10:50 AM  Depression screen PHQ 2/9  Decreased Interest 0 0 0 2 1  Down, Depressed, Hopeless 0 0 0 3 3  PHQ - 2 Score 0 0 0 5 4  Altered sleeping 0 0  3 3  Tired, decreased energy 0 0  2 3  Change in appetite 0 0  3 3  Feeling bad or failure about yourself  0 0  2 3  Trouble concentrating 0 0  1 1  Moving slowly or fidgety/restless 0 0  1 3  Suicidal thoughts 0 0  0 0  PHQ-9 Score 0 0  17 20  Difficult doing work/chores Not difficult at all Not difficult at all  Very difficult Extremely dIfficult    Hypertension:  BP Readings from Last 3 Encounters:  09/21/22 116/72   02/17/22 122/84  12/13/21 131/77    Obesity: Wt Readings from Last 3 Encounters:  09/21/22 237 lb 12.8 oz (107.9 kg)  02/17/22 258 lb 14.4 oz (117.4 kg)  11/02/21 245 lb 12.8 oz (111.5 kg)   BMI Readings from Last 3 Encounters:  09/21/22 31.16 kg/m  02/17/22 33.24 kg/m  11/02/21 31.56 kg/m     Lipids:  Lab Results  Component Value Date   CHOL 195 03/22/2021   CHOL 215 (H) 01/14/2020   CHOL 196 12/05/2018   Lab Results  Component Value Date   HDL 48 03/22/2021   HDL 40 01/14/2020   HDL 32 (L) 12/05/2018   Lab Results  Component Value Date   LDLCALC 120 (H) 03/22/2021   LDLCALC 131 (H) 01/14/2020   LDLCALC 113 (H) 12/05/2018   Lab Results  Component Value Date   TRIG 158 (H) 03/22/2021   TRIG 310 (H) 01/14/2020   TRIG  360 (H) 12/05/2018   Lab Results  Component Value Date   CHOLHDL 4.1 03/22/2021   CHOLHDL 5.4 (H) 01/14/2020   CHOLHDL 6.1 (H) 12/05/2018   No results found for: "LDLDIRECT" Glucose:  Glucose  Date Value Ref Range Status  11/18/2012 106 (H) 65 - 99 mg/dL Final  02/72/5366 93 65 - 99 mg/dL Final  44/03/4740 595 (H) 65 - 99 mg/dL Final   Glucose, Bld  Date Value Ref Range Status  11/02/2021 80 65 - 99 mg/dL Final    Comment:    .            Fasting reference interval .   03/22/2021 84 65 - 99 mg/dL Final    Comment:    .            Fasting reference interval .   01/14/2020 105 (H) 65 - 99 mg/dL Final    Comment:    .            Fasting reference interval . For someone without known diabetes, a glucose value between 100 and 125 mg/dL is consistent with prediabetes and should be confirmed with a follow-up test. .     Flowsheet Row Office Visit from 09/21/2022 in [facility]  AUDIT-C Score 2        Married STD testing and prevention (HIV/chl/gon/syphilis): Not interested  Sexual history: one partner, wife  Hep C Screening: 12/05/2018 Skin cancer: Discussed monitoring for atypical  lesions Colorectal cancer: N/A Prostate cancer: N/A  Lung cancer:  Low Dose CT Chest recommended if Age 27-80 years, 30 pack-year currently smoking OR have quit w/in 15years. Patient is not  a candidate for screening   AAA: The USPSTF recommends one-time screening with ultrasonography in men ages 25 to 75 years who have ever smoked. Patient  is not  a candidate for screening  ECG:  04/16/2015  Vaccines:   GLO:VFIE Out  Tdap: 05/28/2018 Shingrix: N/A Pneumonia: N/A Flu: today  COVID-19:N/A  Advanced Care Planning: A voluntary discussion about advance care planning including the explanation and discussion of advance directives.  Discussed health care proxy and Living will, and the patient was able to identify a health care proxy as wife .  Patient does not have a living will and power of attorney of health care   Patient Active Problem List   Diagnosis Date Noted   Ankle instability 12/03/2017   Metabolic syndrome 12/13/2016   Moderate major depression (HCC) 11/30/2016   History of vasculitis of skin 05/20/2015   GERD without esophagitis 11/10/2014   Seasonal allergic rhinitis 11/10/2014   History of nephrolithiasis 11/10/2014   Obesity (BMI 30-39.9) 11/10/2014   Tobacco abuse 11/10/2014   Snoring 11/10/2014   Insomnia 11/10/2014    Past Surgical History:  Procedure Laterality Date   FACIAL RECONSTRUCTION SURGERY  01/13/2008   After a car fell on him while working on it    Family History  Problem Relation Age of Onset   Varicose Veins Mother    Endometriosis Mother    Drug abuse Father    GI Disease Father    Heart attack Maternal Grandmother    Breast cancer Maternal Grandmother    Thyroid cancer Maternal Grandmother    Congestive Heart Failure Maternal Grandfather    Heart attack Maternal Grandfather    Stroke Maternal Grandfather    Breast cancer Paternal Grandmother    Heart attack Paternal Grandfather     Social History   Socioeconomic History  Marital  status: Married    Spouse name: Cala Bradford    Number of children: 1   Years of education: Not on file   Highest education level: Some college, no degree  Occupational History   Not on file  Tobacco Use   Smoking status: Former    Current packs/day: 0.00    Average packs/day: 0.3 packs/day for 17.6 years (4.4 ttl pk-yrs)    Types: Cigarettes, E-cigarettes    Start date: 11/10/1999    Quit date: 06/05/2017    Years since quitting: 5.2   Smokeless tobacco: Never   Tobacco comments:    e cig mostly  Vaping Use   Vaping status: Every Day   Start date: 11/28/2011   Substances: Nicotine, Flavoring   Devices: Box Mod  Substance and Sexual Activity   Alcohol use: Yes    Alcohol/week: 3.0 standard drinks of alcohol    Types: 3 Standard drinks or equivalent per week   Drug use: No   Sexual activity: Yes    Partners: Female  Other Topics Concern   Not on file  Social History Narrative   He is married has one son.    He was addicted to video games, but is back playing guitar    Social Determinants of Health   Financial Resource Strain: Low Risk  (09/21/2022)   Overall Financial Resource Strain (CARDIA)    Difficulty of Paying Living Expenses: Not hard at all  Food Insecurity: No Food Insecurity (09/21/2022)   Hunger Vital Sign    Worried About Running Out of Food in the Last Year: Never true    Ran Out of Food in the Last Year: Never true  Transportation Needs: No Transportation Needs (09/21/2022)   PRAPARE - Administrator, Civil Service (Medical): No    Lack of Transportation (Non-Medical): No  Physical Activity: Inactive (09/21/2022)   Exercise Vital Sign    Days of Exercise per Week: 0 days    Minutes of Exercise per Session: 0 min  Stress: No Stress Concern Present (09/21/2022)   [employer] of Occupational Health - Occupational Stress Questionnaire    Feeling of Stress : Not at all  Social Connections: Moderately Isolated (09/21/2022)   Social Connection and  Isolation Panel [NHANES]    Frequency of Communication with Friends and Family: More than three times a week    Frequency of Social Gatherings with Friends and Family: More than three times a week    Attends Religious Services: Never    Database administrator or Organizations: No    Attends Banker Meetings: Never    Marital Status: Married  Catering manager Violence: Not At Risk (09/21/2022)   Humiliation, Afraid, Rape, and Kick questionnaire    Fear of Current or Ex-Partner: No    Emotionally Abused: No    Physically Abused: No    Sexually Abused: No     Current Outpatient Medications:    EPINEPHrine 0.3 mg/0.3 mL IJ SOAJ injection, Inject 0.3 mg into the muscle as needed for anaphylaxis., Disp: 2 each, Rfl: 0   fluticasone (FLONASE) 50 MCG/ACT nasal spray, SHAKE LIQUID AND USE 2 SPRAYS IN EACH NOSTRIL DAILY, Disp: 16 g, Rfl: 0   omeprazole (PRILOSEC) 20 MG capsule, TAKE 1 CAPSULE(20 MG) BY MOUTH DAILY, Disp: 90 capsule, Rfl: 0   loratadine (CLARITIN) 10 MG tablet, Take 1 tablet (10 mg total) by mouth daily. (Patient not taking: Reported on 09/21/2022), Disp: 30 tablet, Rfl: 0  Allergies  Allergen Reactions   Bee Venom Anaphylaxis, Shortness Of Breath and Swelling   Penicillins Hives and Other (See Comments)    vasculitis     ROS  Constitutional: Negative for fever , positive for weight change.  Respiratory: Negative for cough and shortness of breath.   Cardiovascular: Negative for chest pain or palpitations.  Gastrointestinal: Negative for abdominal pain, no bowel changes.  Musculoskeletal: Negative for gait problem or joint swelling.  Skin: Negative for rash.  Neurological: Negative for dizziness or headache.  No other specific complaints in a complete review of systems (except as listed in HPI above).    Objective  Vitals:   09/21/22 0812  BP: 116/72  Pulse: 78  Resp: 16  Temp: 97.9 F (36.6 C)  TempSrc: Oral  SpO2: 98%  Weight: 237 lb 12.8 oz  (107.9 kg)  Height: 6' 1.25" (1.861 m)    Body mass index is 31.16 kg/m.  Physical Exam  Constitutional: Patient appears well-developed and well-nourished. No distress.  HENT: Head: Normocephalic and atraumatic. Ears: B TMs ok, no erythema or effusion; Nose: Nose normal. Mouth/Throat: Oropharynx is clear and moist. No oropharyngeal exudate.  Eyes: Conjunctivae and EOM are normal. Pupils are equal, round, and reactive to light. No scleral icterus.  Neck: Normal range of motion. Neck supple. No JVD present. No thyromegaly present.  Cardiovascular: Normal rate, regular rhythm and normal heart sounds.  No murmur heard. No BLE edema. Pulmonary/Chest: Effort normal and breath sounds normal. No respiratory distress. Abdominal: Soft. Bowel sounds are normal, no distension. There is no tenderness. no masses MALE GENITALIA: Normal descended testes bilaterally, no masses palpated, no hernias, no lesions, no discharge RECTAL:not done  Musculoskeletal: Normal range of motion, no joint effusions. No gross deformities Neurological: he is alert and oriented to person, place, and time. No cranial nerve deficit. Coordination, balance, strength, speech and gait are normal.  Skin: Skin is warm and dry. No rash noted. No erythema.  Psychiatric: Patient has a normal mood and affect. behavior is normal. Judgment and thought content normal.    Fall Risk:    09/21/2022    8:05 AM 02/17/2022    1:40 PM 11/02/2021    1:36 PM 05/25/2021    1:48 PM 05/23/2021   10:41 AM  Fall Risk   Falls in the past year? 0 0 0 0 0  Number falls in past yr: 0 0 0    Injury with Fall? 0 0 0    Risk for fall due to : No Fall Risks No Fall Risks  No Fall Risks No Fall Risks  Follow up Falls prevention discussed;Education provided;Falls evaluation completed Falls prevention discussed;Education provided;Falls evaluation completed Falls evaluation completed Falls prevention discussed Falls prevention discussed     Functional Status  Survey: Is the patient deaf or have difficulty hearing?: No Does the patient have difficulty seeing, even when wearing glasses/contacts?: No Does the patient have difficulty concentrating, remembering, or making decisions?: No Does the patient have difficulty walking or climbing stairs?: No Does the patient have difficulty dressing or bathing?: No Does the patient have difficulty doing errands alone such as visiting a doctor's office or shopping?: No    Assessment & Plan  1. Well adult exam  - CBC with Differential - COMPLETE METABOLIC PANEL WITH GFR - Lipid panel - Hemoglobin A1c  2. Dyslipidemia  - Lipid panel  3. Diabetes mellitus screening  - Hemoglobin A1c  4. Needs flu shot  Today     -Prostate cancer screening  and PSA options (with potential risks and benefits of testing vs not testing) were discussed along with recent recs/guidelines. -USPSTF grade A and B recommendations reviewed with patient; age-appropriate recommendations, preventive care, screening tests, etc discussed and encouraged; healthy living encouraged; see AVS for patient education given to patient -Discussed importance of 150 minutes of physical activity weekly, eat two servings of fish weekly, eat one serving of tree nuts ( cashews, pistachios, pecans, almonds.Marland Kitchen) every other day, eat 6 servings of fruit/vegetables daily and drink plenty of water and avoid sweet beverages.  -Reviewed Health Maintenance: yes

## 2022-09-21 ENCOUNTER — Ambulatory Visit (INDEPENDENT_AMBULATORY_CARE_PROVIDER_SITE_OTHER): Payer: BC Managed Care – PPO | Admitting: Family Medicine

## 2022-09-21 ENCOUNTER — Encounter: Payer: Self-pay | Admitting: Family Medicine

## 2022-09-21 VITALS — BP 116/72 | HR 78 | Temp 97.9°F | Resp 16 | Ht 73.25 in | Wt 237.8 lb

## 2022-09-21 DIAGNOSIS — Z0001 Encounter for general adult medical examination with abnormal findings: Secondary | ICD-10-CM | POA: Diagnosis not present

## 2022-09-21 DIAGNOSIS — Z23 Encounter for immunization: Secondary | ICD-10-CM | POA: Diagnosis not present

## 2022-09-21 DIAGNOSIS — Z131 Encounter for screening for diabetes mellitus: Secondary | ICD-10-CM | POA: Diagnosis not present

## 2022-09-21 DIAGNOSIS — Z0000 Encounter for general adult medical examination without abnormal findings: Secondary | ICD-10-CM | POA: Diagnosis not present

## 2022-09-21 DIAGNOSIS — E785 Hyperlipidemia, unspecified: Secondary | ICD-10-CM

## 2022-09-22 LAB — CBC WITH DIFFERENTIAL/PLATELET
Absolute Monocytes: 863 {cells}/uL (ref 200–950)
Basophils Absolute: 50 {cells}/uL (ref 0–200)
Basophils Relative: 0.6 %
Eosinophils Absolute: 232 {cells}/uL (ref 15–500)
Eosinophils Relative: 2.8 %
HCT: 48.1 % (ref 38.5–50.0)
Hemoglobin: 15.9 g/dL (ref 13.2–17.1)
Lymphs Abs: 2191 {cells}/uL (ref 850–3900)
MCH: 29.4 pg (ref 27.0–33.0)
MCHC: 33.1 g/dL (ref 32.0–36.0)
MCV: 89.1 fL (ref 80.0–100.0)
MPV: 9.5 fL (ref 7.5–12.5)
Monocytes Relative: 10.4 %
Neutro Abs: 4963 cells/uL (ref 1500–7800)
Neutrophils Relative %: 59.8 %
Platelets: 341 10*3/uL (ref 140–400)
RBC: 5.4 10*6/uL (ref 4.20–5.80)
RDW: 12.6 % (ref 11.0–15.0)
Total Lymphocyte: 26.4 %
WBC: 8.3 10*3/uL (ref 3.8–10.8)

## 2022-09-22 LAB — COMPLETE METABOLIC PANEL WITH GFR
AG Ratio: 1.6 (calc) (ref 1.0–2.5)
ALT: 14 U/L (ref 9–46)
AST: 14 U/L (ref 10–40)
Albumin: 4.8 g/dL (ref 3.6–5.1)
Alkaline phosphatase (APISO): 65 U/L (ref 36–130)
BUN: 12 mg/dL (ref 7–25)
CO2: 26 mmol/L (ref 20–32)
Calcium: 10.1 mg/dL (ref 8.6–10.3)
Chloride: 102 mmol/L (ref 98–110)
Creat: 1.06 mg/dL (ref 0.60–1.26)
Globulin: 3 g/dL (ref 1.9–3.7)
Glucose, Bld: 102 mg/dL — ABNORMAL HIGH (ref 65–99)
Potassium: 5.2 mmol/L (ref 3.5–5.3)
Sodium: 138 mmol/L (ref 135–146)
Total Bilirubin: 1.1 mg/dL (ref 0.2–1.2)
Total Protein: 7.8 g/dL (ref 6.1–8.1)
eGFR: 93 mL/min/{1.73_m2} (ref >=60)

## 2022-09-22 LAB — LIPID PANEL
Cholesterol: 192 mg/dL (ref <200)
HDL: 46 mg/dL (ref >=40)
LDL Cholesterol (Calc): 116 mg/dL — ABNORMAL HIGH
Non-HDL Cholesterol (Calc): 146 mg/dL — ABNORMAL HIGH (ref <130)
Total CHOL/HDL Ratio: 4.2 (calc) (ref <5.0)
Triglycerides: 181 mg/dL — ABNORMAL HIGH (ref <150)

## 2022-09-22 LAB — HEMOGLOBIN A1C
Hgb A1c MFr Bld: 5.7 %{Hb} — ABNORMAL HIGH (ref <5.7)
Mean Plasma Glucose: 117 mg/dL
eAG (mmol/L): 6.5 mmol/L

## 2023-04-09 ENCOUNTER — Ambulatory Visit
Admission: EM | Admit: 2023-04-09 | Discharge: 2023-04-09 | Disposition: A | Discharge status: Home / Self Care | Attending: Emergency Medicine | Admitting: Emergency Medicine

## 2023-04-09 DIAGNOSIS — R21 Rash and other nonspecific skin eruption: Secondary | ICD-10-CM

## 2023-04-09 LAB — COMPREHENSIVE METABOLIC PANEL
ALT: 16 U/L (ref 0–44)
AST: 18 U/L (ref 15–41)
Albumin: 4.6 g/dL (ref 3.5–5.0)
Alkaline Phosphatase: 63 U/L (ref 38–126)
Anion gap: 9 (ref 5–15)
BUN: 11 mg/dL (ref 6–20)
CO2: 23 mmol/L (ref 22–32)
Calcium: 9.2 mg/dL (ref 8.9–10.3)
Chloride: 103 mmol/L (ref 98–111)
Creatinine, Ser: 1.05 mg/dL (ref 0.61–1.24)
GFR, Estimated: >60 mL/min (ref >60)
Glucose, Bld: 109 mg/dL — ABNORMAL HIGH (ref 70–99)
Potassium: 4.1 mmol/L (ref 3.5–5.1)
Sodium: 135 mmol/L (ref 135–145)
Total Bilirubin: 0.7 mg/dL (ref 0.0–1.2)
Total Protein: 8.2 g/dL — ABNORMAL HIGH (ref 6.5–8.1)

## 2023-04-09 LAB — CBC WITH DIFFERENTIAL/PLATELET
Abs Immature Granulocytes: 0.03 10*3/uL (ref 0.00–0.07)
Basophils Absolute: 0.1 10*3/uL (ref 0.0–0.1)
Basophils Relative: 1 %
Eosinophils Absolute: 0.2 10*3/uL (ref 0.0–0.5)
Eosinophils Relative: 3 %
HCT: 46.6 % (ref 39.0–52.0)
Hemoglobin: 15.9 g/dL (ref 13.0–17.0)
Immature Granulocytes: 0 %
Lymphocytes Relative: 25 %
Lymphs Abs: 2 10*3/uL (ref 0.7–4.0)
MCH: 29.6 pg (ref 26.0–34.0)
MCHC: 34.1 g/dL (ref 30.0–36.0)
MCV: 86.6 fL (ref 80.0–100.0)
Monocytes Absolute: 0.8 10*3/uL (ref 0.1–1.0)
Monocytes Relative: 10 %
Neutro Abs: 4.9 10*3/uL (ref 1.7–7.7)
Neutrophils Relative %: 61 %
Platelets: 285 10*3/uL (ref 150–400)
RBC: 5.38 MIL/uL (ref 4.22–5.81)
RDW: 12.5 % (ref 11.5–15.5)
WBC: 7.9 10*3/uL (ref 4.0–10.5)
nRBC: 0 % (ref 0.0–0.2)

## 2023-04-09 LAB — RESP PANEL BY RT-PCR (FLU A&B, COVID) ARPGX2
Influenza A by PCR: NEGATIVE
Influenza B by PCR: NEGATIVE
SARS Coronavirus 2 by RT PCR: NEGATIVE

## 2023-04-09 LAB — GROUP A STREP BY PCR: Group A Strep by PCR: NOT DETECTED

## 2023-04-09 MED ORDER — PREDNISONE 10 MG (21) PO TBPK: ORAL_TABLET | ORAL | 0 refills | Status: DC

## 2023-04-09 MED ORDER — DEXAMETHASONE SODIUM PHOSPHATE 10 MG/ML IJ SOLN
10.0000 mg | Freq: Once | INTRAMUSCULAR | Status: AC
  Administered 2023-04-09: 10 mg via INTRAMUSCULAR

## 2023-04-09 NOTE — ED Provider Notes (Signed)
 [facility]    CSN: [id number] Arrival date & time: 04/09/23  0827      History   Chief Complaint Chief Complaint  Patient presents with   Rash    HPI Timothy Fleming is a 38 y.o. male.   HPI  38 year old male with past medical history significant for GERD, kidney stones, insomnia, and history of vasculitis presents for evaluation of rash in his face and arms that he noticed 2 days ago.  He states that the rash is not itchy or other than on his right elbow.  He is also complaining of achiness in both elbows.  He denies any sore throat, runny nose, nasal congestion.  No new personal hygiene products, laundry detergents, medications, supplements, or other environmental exposures that he is aware of.  He does have pets but none of them are scratching.  He has tried Benadryl and reports that the rash became worse after he took Benadryl.  Past Medical History:  Diagnosis Date   Allergy    Seasonal   GERD (gastroesophageal reflux disease)    History of kidney stones    x4   Insomnia     Patient Active Problem List   Diagnosis Date Noted   Ankle instability 12/03/2017   Metabolic syndrome 12/13/2016   Moderate major depression (HCC) 11/30/2016   History of vasculitis of skin 05/20/2015   GERD without esophagitis 11/10/2014   Seasonal allergic rhinitis 11/10/2014   History of nephrolithiasis 11/10/2014   Obesity (BMI 30-39.9) 11/10/2014   Tobacco abuse 11/10/2014   Snoring 11/10/2014   Insomnia 11/10/2014    Past Surgical History:  Procedure Laterality Date   FACIAL RECONSTRUCTION SURGERY  01/13/2008   After a car fell on him while working on it       Home Medications    Prior to Admission medications   Medication Sig Start Date End Date Taking? Authorizing Provider  predniSONE (STERAPRED UNI-PAK 21 TAB) 10 MG (21) TBPK tablet Take 6 tablets on day 1, 5 tablets day 2, 4 tablets day 3, 3 tablets day 4, 2 tablets day 5, 1 tablet day 6 04/09/23  Yes Becky Augusta, NP  EPINEPHrine 0.3 mg/0.3 mL IJ SOAJ injection Inject 0.3 mg into the muscle as needed for anaphylaxis. 01/07/21   Alba Cory, MD  fluticasone (FLONASE) 50 MCG/ACT nasal spray SHAKE LIQUID AND USE 2 SPRAYS IN EACH NOSTRIL DAILY 07/08/21   Carlynn Purl, Danna Hefty, MD  loratadine (CLARITIN) 10 MG tablet Take 1 tablet (10 mg total) by mouth daily. Patient not taking: Reported on 09/21/2022 02/17/22   Berniece Salines, FNP  omeprazole (PRILOSEC) 20 MG capsule TAKE 1 CAPSULE(20 MG) BY MOUTH DAILY 07/17/21   Alba Cory, MD    Family History Family History  Problem Relation Age of Onset   Varicose Veins Mother    Endometriosis Mother    Drug abuse Father    GI Disease Father    Heart attack Maternal Grandmother    Breast cancer Maternal Grandmother    Thyroid cancer Maternal Grandmother    Congestive Heart Failure Maternal Grandfather    Heart attack Maternal Grandfather    Stroke Maternal Grandfather    Breast cancer Paternal Grandmother    Heart attack Paternal Grandfather     Social History Social History   Tobacco Use   Smoking status: Former    Current packs/day: 0.00    Average packs/day: 0.3 packs/day for 17.6 years (4.4 ttl pk-yrs)    Types: Cigarettes, E-cigarettes  Start date: 11/10/1999    Quit date: 06/05/2017    Years since quitting: 5.8   Smokeless tobacco: Never   Tobacco comments:    e cig mostly  Vaping Use   Vaping status: Every Day   Start date: 11/28/2011   Substances: Nicotine, Flavoring   Devices: Box Mod  Substance Use Topics   Alcohol use: Yes    Alcohol/week: 3.0 standard drinks of alcohol    Types: 3 Standard drinks or equivalent per week   Drug use: No     Allergies   Bee venom and Penicillins   Review of Systems Review of Systems  Constitutional:  Negative for fever.  HENT:  Negative for congestion, rhinorrhea and sore throat.   Skin:  Positive for rash.     Physical Exam Triage Vital Signs ED Triage Vitals  Encounter  Vitals Group     BP      Systolic BP Percentile      Diastolic BP Percentile      Pulse      Resp      Temp      Temp src      SpO2      Weight      Height      Head Circumference      Peak Flow      Pain Score      Pain Loc      Pain Education      Exclude from Growth Chart    No data found.  Updated Vital Signs BP 122/76 (BP Location: Left Arm)   Pulse 98   Temp 98.8 F (37.1 C) (Oral)   Resp 16   SpO2 98%   Visual Acuity Right Eye Distance:   Left Eye Distance:   Bilateral Distance:    Right Eye Near:   Left Eye Near:    Bilateral Near:     Physical Exam Vitals and nursing note reviewed.  Constitutional:      Appearance: Normal appearance. He is not ill-appearing.  HENT:     Head: Normocephalic and atraumatic.     Nose: Nose normal.     Mouth/Throat:     Mouth: Mucous membranes are moist.     Pharynx: Oropharynx is clear. No oropharyngeal exudate or posterior oropharyngeal erythema.  Musculoskeletal:     Cervical back: Normal range of motion and neck supple. No tenderness.  Lymphadenopathy:     Cervical: No cervical adenopathy.  Skin:    General: Skin is warm and dry.     Capillary Refill: Capillary refill takes less than 2 seconds.     Findings: Erythema and rash present.  Neurological:     General: No focal deficit present.     Mental Status: He is alert and oriented to person, place, and time.      UC Treatments / Results  Labs (all labs ordered are listed, but only abnormal results are displayed) Labs Reviewed  COMPREHENSIVE METABOLIC PANEL - Abnormal; Notable for the following components:      Result Value   Glucose, Bld 109 (*)    Total Protein 8.2 (*)    All other components within normal limits  GROUP A STREP BY PCR  RESP PANEL BY RT-PCR (FLU A&B, COVID) [id number]  CBC WITH DIFFERENTIAL/PLATELET    EKG   Radiology No results found.  Procedures Procedures (including critical care time)  Medications Ordered in UC Medications  - No data to display  Initial Impression / Assessment and  Plan / UC Course  I have reviewed the triage vital signs and the nursing notes.  Pertinent labs & imaging results that were available during my care of the patient were reviewed by me and considered in my medical decision making (see chart for details).   Patient is a pleasant, nontoxic-appearing 38 year old male presenting for evaluation of a rash on his face and arms that he first noticed 2 days ago.  No associated fever, runny nose, nasal congestion, or sore throat.  He does have a history of vasculitis in the past that was a result of hypersensitivity reaction to penicillin.  He is not taking any new medications or supplements and he denies any new personal hygiene products or environmental exposures.  He has not been outdoors.  He does have pets but states that none of them are itching.      As you can see in images above, the rash consists of erythematous maculopapular lesions that are blanchable.  Closer inspection of his chest, abdomen, and back also reveal similar lesions present there.  He denies any presence on his lower extremities.  Differential diagnosis include hypersensitivity vasculitis, viral exanthem, atypical presentation of strep, COVID, or influenza.  I will order a CBC, CMP, strep PCR, and COVID and flu PCR to rule out potential infectious sources.  If patient rules out for any infectious source I will treat him for potential allergic reaction/hypersensitivity vasculitis with course of corticosteroids and antihistamines.   CBC shows normal white count of 7.9, normal H&H of 15.9 and 46.6 respectively, normal platelet count of 25.  Normalities to the differential.  CMP shows normal electrolytes with a mildly elevated glucose of 109.  Normal renal function and calcium.  Mild elevation of total protein at 8.2.  Transaminases unremarkable.  Strep PCR is negative.  Respiratory panel is negative for COVID or  influenza.  I will discharge patient home with diagnosis of skin rash and I will treat it as if it is an allergic reaction with 6 day taper of prednisone and dual antihistamine therapy.  I will have him follow-up this week with his primary care provider to ensure rash is improving and/or resolving.  Given the late hour of the day I will have staff administer 10 mg of IM Decadron prior to discharge and patient can begin prednisone tomorrow morning.   Final Clinical Impressions(s) / UC Diagnoses   Final diagnoses:  Rash and nonspecific skin eruption     Discharge Instructions      Take over-the-counter Allegra 180 mg daily or Zyrtec or Claritin 10 mg daily to help with any itching.  You can take over-the-counter Benadryl, 50 mg at bedtime, as needed for itching and sleep.  Take the prednisone pack according to the package instructions.  You will taken on tapering dose over a period of 6 days.  Take it with food and always take it first in the morning with breakfast.  Take over-the-counter Pepcid 20 mg twice daily to help with itching as well.  If you develop any swelling of your lips or tongue, tightness in your throat, or difficulty breathing you need to go to the ER for evaluation.   You need to make an appointment with your primary care provider to follow-up later this week to ensure that your rash is improving or resolving.     ED Prescriptions     Medication Sig Dispense Auth. Provider   predniSONE (STERAPRED UNI-PAK 21 TAB) 10 MG (21) TBPK tablet Take 6 tablets on  day 1, 5 tablets day 2, 4 tablets day 3, 3 tablets day 4, 2 tablets day 5, 1 tablet day 6 21 tablet Becky Augusta, NP      PDMP not reviewed this encounter.   Becky Augusta, NP 04/09/23 1035

## 2023-04-09 NOTE — Discharge Instructions (Addendum)
 Take over-the-counter Allegra 180 mg daily or Zyrtec or Claritin 10 mg daily to help with any itching.  You can take over-the-counter Benadryl, 50 mg at bedtime, as needed for itching and sleep.  Take the prednisone pack according to the package instructions.  You will taken on tapering dose over a period of 6 days.  Take it with food and always take it first in the morning with breakfast.  Take over-the-counter Pepcid 20 mg twice daily to help with itching as well.  If you develop any swelling of your lips or tongue, tightness in your throat, or difficulty breathing you need to go to the ER for evaluation.   You need to make an appointment with your primary care provider to follow-up later this week to ensure that your rash is improving or resolving.

## 2023-04-09 NOTE — ED Triage Notes (Signed)
 Rash on face and arms x 2 days. Hasn't gotten in to anything.

## 2023-04-11 ENCOUNTER — Encounter: Payer: Self-pay | Admitting: Family Medicine

## 2023-04-11 ENCOUNTER — Ambulatory Visit: Admitting: Family Medicine

## 2023-04-11 VITALS — BP 122/68 | HR 63 | Resp 16 | Ht 73.25 in | Wt 236.8 lb

## 2023-04-11 DIAGNOSIS — R21 Rash and other nonspecific skin eruption: Secondary | ICD-10-CM | POA: Diagnosis not present

## 2023-04-11 DIAGNOSIS — R771 Abnormality of globulin: Secondary | ICD-10-CM | POA: Diagnosis not present

## 2023-04-11 NOTE — Progress Notes (Signed)
 Name: Timothy Fleming   MRN: [id number]    DOB: 30-Jul-1985   Date:04/11/2023       Progress Note  Subjective  Chief Complaint  Chief Complaint  Patient presents with   Follow-up    UC-rash along with joint pain/swelling, over the weekend started. Arms/ shoulder and face    Discussed the use of AI scribe software for clinical note transcription with the patient, who gave verbal consent to proceed.  History of Present Illness   Timothy Fleming is a 38 year old male with vasculitis who presents with a rash and joint pain.  He has developed a rash that began on his face and spread to his arms, including the armpits, elbows, and down to his fingertips. The rash is non-blanching, red, and sometimes bumpy, with intermittent itching. It has improved significantly with a prednisone taper and allergy medications given by urgent care two days ago .  He experiences significant joint pain, particularly in the knees and elbows, which makes movement difficult. He also reports fatigue. He is currently taking Claritin and Pepcid twice a day to help with itching and inflammation. He has beeb taking Aleve with  prednisone, advised him to switch to Tylenol instead .  He has a history of vasculitis diagnosed over a decade ago, which previously affected his legs. He recalls seeing a rheumatologist at that time, but no specific type of vasculitis was identified, and no biopsy was performed to confirm the diagnosis. Recent blood work showed a slightly elevated total protein level, but no signs of infection were present.  No shortness of breath, wheezing, recent viral illness, or tick bites. He has not been working outside or exposed to potential allergens. There is no family history of autoimmune diseases such as lupus or rheumatoid arthritis        Patient Active Problem List   Diagnosis Date Noted   Ankle instability 12/03/2017   Metabolic syndrome 12/13/2016   Moderate major depression (HCC) 11/30/2016    History of vasculitis of skin 05/20/2015   GERD without esophagitis 11/10/2014   Seasonal allergic rhinitis 11/10/2014   History of nephrolithiasis 11/10/2014   Obesity (BMI 30-39.9) 11/10/2014   Tobacco abuse 11/10/2014   Snoring 11/10/2014   Insomnia 11/10/2014    Social History   Tobacco Use   Smoking status: Former    Current packs/day: 0.00    Average packs/day: 0.3 packs/day for 17.6 years (4.4 ttl pk-yrs)    Types: Cigarettes, E-cigarettes    Start date: 11/10/1999    Quit date: 06/05/2017    Years since quitting: 5.8   Smokeless tobacco: Never   Tobacco comments:    e cig mostly  Substance Use Topics   Alcohol use: Yes    Alcohol/week: 3.0 standard drinks of alcohol    Types: 3 Standard drinks or equivalent per week     Current Outpatient Medications:    EPINEPHrine 0.3 mg/0.3 mL IJ SOAJ injection, Inject 0.3 mg into the muscle as needed for anaphylaxis., Disp: 2 each, Rfl: 0   fluticasone (FLONASE) 50 MCG/ACT nasal spray, SHAKE LIQUID AND USE 2 SPRAYS IN EACH NOSTRIL DAILY, Disp: 16 g, Rfl: 0   omeprazole (PRILOSEC) 20 MG capsule, TAKE 1 CAPSULE(20 MG) BY MOUTH DAILY, Disp: 90 capsule, Rfl: 0   predniSONE (STERAPRED UNI-PAK 21 TAB) 10 MG (21) TBPK tablet, Take 6 tablets on day 1, 5 tablets day 2, 4 tablets day 3, 3 tablets day 4, 2 tablets day 5, 1 tablet day 6,  Disp: 21 tablet, Rfl: 0   loratadine (CLARITIN) 10 MG tablet, Take 1 tablet (10 mg total) by mouth daily. (Patient not taking: Reported on 04/11/2023), Disp: 30 tablet, Rfl: 0  Allergies  Allergen Reactions   Bee Venom Anaphylaxis, Shortness Of Breath and Swelling   Penicillins Hives and Other (See Comments)    vasculitis    ROS  Ten systems reviewed and is negative except as mentioned in HPI    Objective  Vitals:   04/11/23 1342  BP: 122/68  Pulse: 63  Resp: 16  SpO2: 96%  Weight: 236 lb 12.8 oz (107.4 kg)  Height: 6' 1.25" (1.861 m)    Body mass index is 31.03 kg/m.    Physical  Exam  Constitutional: Patient appears well-developed and well-nourished. Obese  No distress.  HEENT: head atraumatic, normocephalic, pupils equal and reactive to light, neck supple Cardiovascular: Normal rate, regular rhythm and normal heart sounds.  No murmur heard. No BLE edema. Pulmonary/Chest: Effort normal and breath sounds normal. No respiratory distress. Abdominal: Soft.  There is no tenderness. Skin: rash , does not blench, small in size mostly pin point, erythematous some are macules and some papules  Psychiatric: Patient has a normal mood and affect. behavior is normal. Judgment and thought content normal.   Recent Results (from the past 2160 hours)  CBC with Differential     Status: None   Collection Time: 04/09/23  9:42 AM  Result Value Ref Range   WBC 7.9 4.0 - 10.5 K/uL   RBC 5.38 4.22 - 5.81 MIL/uL   Hemoglobin 15.9 13.0 - 17.0 g/dL   HCT 16.1 09.6 - 04.5 %   MCV 86.6 80.0 - 100.0 fL   MCH 29.6 26.0 - 34.0 pg   MCHC 34.1 30.0 - 36.0 g/dL   RDW 40.9 81.1 - 91.4 %   Platelets 285 150 - 400 K/uL   nRBC 0.0 0.0 - 0.2 %   Neutrophils Relative % 61 %   Neutro Abs 4.9 1.7 - 7.7 K/uL   Lymphocytes Relative 25 %   Lymphs Abs 2.0 0.7 - 4.0 K/uL   Monocytes Relative 10 %   Monocytes Absolute 0.8 0.1 - 1.0 K/uL   Eosinophils Relative 3 %   Eosinophils Absolute 0.2 0.0 - 0.5 K/uL   Basophils Relative 1 %   Basophils Absolute 0.1 0.0 - 0.1 K/uL   Immature Granulocytes 0 %   Abs Immature Granulocytes 0.03 0.00 - 0.07 K/uL    Comment: Performed at [facility], [address] [place], [place] [address]  Comprehensive metabolic panel     Status: Abnormal   Collection Time: 04/09/23  9:42 AM  Result Value Ref Range   Sodium 135 135 - 145 mmol/L   Potassium 4.1 3.5 - 5.1 mmol/L   Chloride 103 98 - 111 mmol/L   CO2 23 22 - 32 mmol/L   Glucose, Bld 109 (H) 70 - 99 mg/dL    Comment: Glucose reference range applies only to samples taken after fasting for at least 8  hours.   BUN 11 6 - 20 mg/dL   Creatinine, Ser 6.21 0.61 - 1.24 mg/dL   Calcium 9.2 8.9 - 30.8 mg/dL   Total Protein 8.2 (H) 6.5 - 8.1 g/dL   Albumin 4.6 3.5 - 5.0 g/dL   AST 18 15 - 41 U/L   ALT 16 0 - 44 U/L   Alkaline Phosphatase 63 38 - 126 U/L   Total Bilirubin 0.7 0.0 -  1.2 mg/dL   GFR, Estimated >91 >47 mL/min    Comment: (NOTE) Calculated using the CKD-EPI Creatinine Equation (2021)    Anion gap 9 5 - 15    Comment: Performed at [facility], [address] [place], [place] [address]  Group A Strep by PCR     Status: None   Collection Time: 04/09/23  9:42 AM   Specimen: Throat; Sterile Swab  Result Value Ref Range   Group A Strep by PCR NOT DETECTED NOT DETECTED    Comment: Performed at [facility], [address], [place], [place] [address]  Resp Panel by RT-PCR (Flu A&B, Covid) Anterior Nasal Swab     Status: None   Collection Time: 04/09/23  9:42 AM   Specimen: Anterior Nasal Swab  Result Value Ref Range   SARS Coronavirus 2 by RT PCR NEGATIVE NEGATIVE    Comment: (NOTE) SARS-CoV-2 target nucleic acids are NOT DETECTED.  The SARS-CoV-2 RNA is generally detectable in upper respiratory specimens during the acute phase of infection. The lowest concentration of SARS-CoV-2 viral copies this assay can detect is 138 copies/mL. A negative result does not preclude SARS-Cov-2 infection and should not be used as the sole basis for treatment or other patient management decisions. A negative result may occur with  improper specimen collection/handling, submission of specimen other than nasopharyngeal swab, presence of viral mutation(s) within the areas targeted by this assay, and inadequate number of viral copies(<138 copies/mL). A negative result must be combined with clinical observations, patient history, and epidemiological information. The expected result is Negative.  Fact Sheet for Patients:   BloggerCourse.com  Fact Sheet for Healthcare Providers:  SeriousBroker.it  This test is no t yet approved or cleared by the [place] FDA and  has been authorized for detection and/or diagnosis of SARS-CoV-2 by FDA under an Emergency Use Authorization (EUA). This EUA will remain  in effect (meaning this test can be used) for the duration of the COVID-19 declaration under Section 564(b)(1) of the Act, 21 U.S.C.section 360bbb-3(b)(1), unless the authorization is terminated  or revoked sooner.       Influenza A by PCR NEGATIVE NEGATIVE   Influenza B by PCR NEGATIVE NEGATIVE    Comment: (NOTE) The Xpert Xpress SARS-CoV-2/FLU/RSV plus assay is intended as an aid in the diagnosis of influenza from Nasopharyngeal swab specimens and should not be used as a sole basis for treatment. Nasal washings and aspirates are unacceptable for Xpert Xpress SARS-CoV-2/FLU/RSV testing.  Fact Sheet for Patients: BloggerCourse.com  Fact Sheet for Healthcare Providers: SeriousBroker.it  This test is not yet approved or cleared by the [place] FDA and has been authorized for detection and/or diagnosis of SARS-CoV-2 by FDA under an Emergency Use Authorization (EUA). This EUA will remain in effect (meaning this test can be used) for the duration of the COVID-19 declaration under Section 564(b)(1) of the Act, 21 U.S.C. section 360bbb-3(b)(1), unless the authorization is terminated or revoked.  Performed at [facility], [address] [place], [place] [address]      Assessment and Plan    Vasculitis Recurrent vasculitis with rash and arthralgia. Rash improved with prednisone. Elevated total protein, no infection. Differential includes autoimmune conditions. - Order lupus panel and serum protein electrophoresis. - Order iron panel. - Continue prednisone taper. - Administer  Claritin or other antihistamines for pruritus. - Administer Pepcid twice a day for pruritus. - Administer Tylenol 500 mg up to six times a day for analgesia. -  Avoid NSAIDs while on prednisone. - Consider referral to rheumatologist if symptoms persist. - Consider punch biopsy if rash recurs.

## 2023-04-13 LAB — C-REACTIVE PROTEIN: CRP: <3 mg/L (ref <8.0)

## 2023-04-13 LAB — ANA: Anti Nuclear Antibody (ANA): POSITIVE — AB

## 2023-04-13 LAB — PROTEIN ELECTROPHORESIS, SERUM
Albumin ELP: 4.7 g/dL (ref 3.8–4.8)
Alpha 1: 0.3 g/dL (ref 0.2–0.3)
Alpha 2: 0.7 g/dL (ref 0.5–0.9)
Beta 2: 0.4 g/dL (ref 0.2–0.5)
Beta Globulin: 0.5 g/dL (ref 0.4–0.6)
Gamma Globulin: 1.1 g/dL (ref 0.8–1.7)
Total Protein: 7.7 g/dL (ref 6.1–8.1)

## 2023-04-13 LAB — CYCLIC CITRUL PEPTIDE ANTIBODY, IGG: Cyclic Citrullin Peptide Ab: <16 U

## 2023-04-13 LAB — ANTI-NUCLEAR AB-TITER (ANA TITER): ANA Titer 1: 1:80 {titer} — ABNORMAL HIGH

## 2023-04-13 LAB — SEDIMENTATION RATE: Sed Rate: 6 mm/h (ref 0–15)

## 2023-04-13 LAB — RHEUMATOID FACTOR: Rheumatoid fact SerPl-aCnc: <10 [IU]/mL (ref <14)

## 2023-04-16 ENCOUNTER — Encounter: Payer: Self-pay | Admitting: Family Medicine

## 2023-04-16 ENCOUNTER — Other Ambulatory Visit: Payer: Self-pay | Admitting: Family Medicine

## 2023-04-16 DIAGNOSIS — R768 Other specified abnormal immunological findings in serum: Secondary | ICD-10-CM

## 2023-04-16 DIAGNOSIS — R21 Rash and other nonspecific skin eruption: Secondary | ICD-10-CM

## 2023-04-16 DIAGNOSIS — R771 Abnormality of globulin: Secondary | ICD-10-CM

## 2023-05-30 ENCOUNTER — Ambulatory Visit
Admission: EM | Admit: 2023-05-30 | Discharge: 2023-05-30 | Disposition: A | Discharge status: Home / Self Care | Attending: Emergency Medicine | Admitting: Emergency Medicine

## 2023-05-30 ENCOUNTER — Encounter: Payer: Self-pay | Admitting: Emergency Medicine

## 2023-05-30 DIAGNOSIS — H5712 Ocular pain, left eye: Secondary | ICD-10-CM | POA: Diagnosis not present

## 2023-05-30 DIAGNOSIS — T1502XD Foreign body in cornea, left eye, subsequent encounter: Secondary | ICD-10-CM | POA: Diagnosis not present

## 2023-05-30 DIAGNOSIS — S0552XA Penetrating wound with foreign body of left eyeball, initial encounter: Secondary | ICD-10-CM

## 2023-05-30 DIAGNOSIS — T1502XA Foreign body in cornea, left eye, initial encounter: Secondary | ICD-10-CM | POA: Diagnosis not present

## 2023-05-30 NOTE — Discharge Instructions (Addendum)
 Go to [facility] in [place] for treatment of the metallic foreign body in your left eye.  Their address is [address], [place], [place].  [address].  Phone number [phone].

## 2023-05-30 NOTE — ED Provider Notes (Signed)
 [facility]    CSN: [id number] Arrival date & time: 05/30/23  0102      History   Chief Complaint Chief Complaint  Patient presents with   Foreign Body in Eye    HPI Timothy Fleming is a 38 y.o. male.   HPI  38 year old male with past medical history significant for insomnia, kidney stones, and GERD presents for evaluation of a metallic foreign body in his left eye.  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  When he got home he noticed a dark spot in his eye that he thought was an eyelash.  He attempted to irrigate his eye without any improvement of symptoms.  He states that his eye feels irritated but he does not have any pain and he denies changes in vision.  He describes his eyes being crusty this morning and he also states that his eye has been watering but no mucopurulent discharge.  Past Medical History:  Diagnosis Date   Allergy    Seasonal   GERD (gastroesophageal reflux disease)    History of kidney stones    x4   Insomnia     Patient Active Problem List   Diagnosis Date Noted   Ankle instability 12/03/2017   Metabolic syndrome 12/13/2016   Moderate major depression (HCC) 11/30/2016   History of vasculitis of skin 05/20/2015   GERD without esophagitis 11/10/2014   Seasonal allergic rhinitis 11/10/2014   History of nephrolithiasis 11/10/2014   Obesity (BMI 30-39.9) 11/10/2014   Tobacco abuse 11/10/2014   Snoring 11/10/2014   Insomnia 11/10/2014    Past Surgical History:  Procedure Laterality Date   FACIAL RECONSTRUCTION SURGERY  01/13/2008   After a car fell on him while working on it       Home Medications    Prior to Admission medications   Medication Sig Start Date End Date Taking? Authorizing Provider  EPINEPHrine  0.3 mg/0.3 mL IJ SOAJ injection Inject 0.3 mg into the muscle as needed for anaphylaxis. 01/07/21  Yes Sowles, Krichna, MD  fluticasone  (FLONASE ) 50 MCG/ACT nasal spray SHAKE  LIQUID AND USE 2 SPRAYS IN EACH NOSTRIL DAILY 07/08/21  Yes Sowles, Krichna, MD  omeprazole  (PRILOSEC) 20 MG capsule TAKE 1 CAPSULE(20 MG) BY MOUTH DAILY 07/17/21  Yes Sowles, Krichna, MD  loratadine  (CLARITIN ) 10 MG tablet Take 1 tablet (10 mg total) by mouth daily. Patient not taking: Reported on 09/21/2022 02/17/22   Quinton Buckler, FNP  predniSONE  (STERAPRED UNI-PAK 21 TAB) 10 MG (21) TBPK tablet Take 6 tablets on day 1, 5 tablets day 2, 4 tablets day 3, 3 tablets day 4, 2 tablets day 5, 1 tablet day 6 04/09/23   Kent Pear, NP    Family History Family History  Problem Relation Age of Onset   Varicose Veins Mother    Endometriosis Mother    Drug abuse Father    GI Disease Father    Heart attack Maternal Grandmother    Breast cancer Maternal Grandmother    Thyroid  cancer Maternal Grandmother    Congestive Heart Failure Maternal Grandfather    Heart attack Maternal Grandfather    Stroke Maternal Grandfather    Breast cancer Paternal Grandmother    Heart attack Paternal Grandfather     Social History Social History   Tobacco Use   Smoking status: Former    Current packs/day: 0.00    Average packs/day: 0.3 packs/day for 17.6 years (4.4 ttl pk-yrs)  Types: Cigarettes, E-cigarettes    Start date: 11/10/1999    Quit date: 06/05/2017    Years since quitting: 5.9   Smokeless tobacco: Never   Tobacco comments:    e cig mostly  Vaping Use   Vaping status: Every Day   Start date: 11/28/2011   Substances: Nicotine, Flavoring   Devices: Box Mod  Substance Use Topics   Alcohol use: Yes    Alcohol/week: 3.0 standard drinks of alcohol    Types: 3 Standard drinks or equivalent per week   Drug use: No     Allergies   Bee venom and Penicillins   Review of Systems Review of Systems  Eyes:  Positive for pain and discharge. Negative for photophobia, redness and itching.     Physical Exam Triage Vital Signs ED Triage Vitals  Encounter Vitals Group     BP 05/30/23 0908  129/82     Systolic BP Percentile --      Diastolic BP Percentile --      Pulse Rate 05/30/23 0908 (!) 59     Resp 05/30/23 0908 16     Temp 05/30/23 0908 98 F (36.7 C)     Temp Source 05/30/23 0908 Oral     SpO2 05/30/23 0908 97 %     Weight --      Height --      Head Circumference --      Peak Flow --      Pain Score 05/30/23 0907 0     Pain Loc --      Pain Education --      Exclude from Growth Chart --    No data found.  Updated Vital Signs BP 129/82 (BP Location: Right Arm)   Pulse (!) 59   Temp 98 F (36.7 C) (Oral)   Resp 16   SpO2 97%   Visual Acuity Right Eye Distance: 20/30 Left Eye Distance: 20/30 Bilateral Distance: 20/30 (w/o correction)  Right Eye Near:   Left Eye Near:    Bilateral Near:     Physical Exam Vitals and nursing note reviewed.  Constitutional:      Appearance: Normal appearance. He is not ill-appearing.  HENT:     Head: Normocephalic and atraumatic.  Eyes:     General: No scleral icterus.       Left eye: No discharge.     Extraocular Movements: Extraocular movements intact.     Conjunctiva/sclera: Conjunctivae normal.     Pupils: Pupils are equal, round, and reactive to light.  Skin:    General: Skin is warm and dry.     Capillary Refill: Capillary refill takes less than 2 seconds.  Neurological:     General: No focal deficit present.     Mental Status: He is alert and oriented to person, place, and time.      UC Treatments / Results  Labs (all labs ordered are listed, but only abnormal results are displayed) Labs Reviewed - No data to display  EKG   Radiology No results found.  Procedures Procedures (including critical care time)  Medications Ordered in UC Medications - No data to display  Initial Impression / Assessment and Plan / UC Course  I have reviewed the triage vital signs and the nursing notes.  Pertinent labs & imaging results that were available during my care of the patient were reviewed by me and  considered in my medical decision making (see chart for details).   Patient is a pleasant, nontoxic-appearing  38 year old male presenting for evaluation of metallic foreign body in his left eye as outlined in the HPI above.  As you can see in the image above, there is a dark, metallic object embedded in the eye just lateral of the iris at the 3 o'clock position.  This does not change with blinking.  Bulbar and labral conjunctiva are unremarkable and pupils equal round and reactive.  Normal red light reflex in the left eye.  Visual acuity OU 20/30, OD 20/30, OS 20/30.  I will contact [facility] in [place] to see if they can see this patient as he is going to need a slit-lamp exam and or [place] for removal of the metallic foreign body as well as to remove the rust ring.  I spoke with Crystal, the receptionist at [facility] in their [place] office.  They have asked that the patient come right over so that they can evaluate and treat the patient.   Final Clinical Impressions(s) / UC Diagnoses   Final diagnoses:  Intraocular foreign body of left eye, initial encounter     Discharge Instructions      Go to [facility] in [place] for treatment of the metallic foreign body in your left eye.  Their address is [address], [place], [place].  [address].  Phone number [phone].     ED Prescriptions   None    PDMP not reviewed this encounter.   Kent Pear, NP 05/30/23 [phone]

## 2023-05-30 NOTE — ED Triage Notes (Signed)
 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 When he got home he noticed something in his eye and tried to flush it out.

## 2023-06-01 DIAGNOSIS — H5712 Ocular pain, left eye: Secondary | ICD-10-CM | POA: Diagnosis not present

## 2023-06-01 DIAGNOSIS — T1502XD Foreign body in cornea, left eye, subsequent encounter: Secondary | ICD-10-CM | POA: Diagnosis not present

## 2023-06-13 DIAGNOSIS — R21 Rash and other nonspecific skin eruption: Secondary | ICD-10-CM | POA: Diagnosis not present

## 2023-06-13 DIAGNOSIS — Z8679 Personal history of other diseases of the circulatory system: Secondary | ICD-10-CM | POA: Diagnosis not present

## 2023-07-04 DIAGNOSIS — L309 Dermatitis, unspecified: Secondary | ICD-10-CM | POA: Diagnosis not present

## 2023-07-04 DIAGNOSIS — L564 Polymorphous light eruption: Secondary | ICD-10-CM | POA: Diagnosis not present

## 2023-08-06 DIAGNOSIS — L409 Psoriasis, unspecified: Secondary | ICD-10-CM | POA: Diagnosis not present

## 2023-08-06 DIAGNOSIS — M2550 Pain in unspecified joint: Secondary | ICD-10-CM | POA: Diagnosis not present

## 2023-08-28 ENCOUNTER — Other Ambulatory Visit: Payer: Self-pay

## 2023-08-28 NOTE — Progress Notes (Signed)
 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  [facility] Acct #:  [id number] [facility] Specimen #:  [id number]  Rapid drug screen results =  Negative

## 2023-09-10 ENCOUNTER — Other Ambulatory Visit: Payer: Self-pay

## 2023-09-10 NOTE — Progress Notes (Unsigned)
 Reconciled med list & allergies

## 2023-09-20 ENCOUNTER — Ambulatory Visit: Payer: Self-pay

## 2023-09-20 DIAGNOSIS — Z0000 Encounter for general adult medical examination without abnormal findings: Secondary | ICD-10-CM

## 2023-09-20 LAB — POCT URINALYSIS DIPSTICK
Bilirubin, UA: NEGATIVE
Blood, UA: NEGATIVE
Glucose, UA: NEGATIVE
Ketones, UA: NEGATIVE
Leukocytes, UA: NEGATIVE
Nitrite, UA: NEGATIVE
Protein, UA: NEGATIVE
Spec Grav, UA: 1.015 (ref 1.010–1.025)
Urobilinogen, UA: 0.2 U/dL
pH, UA: 6.5 (ref 5.0–8.0)

## 2023-09-21 LAB — CMP12+LP+TP+TSH+6AC+CBC/D/PLT
ALT: 17 IU/L (ref 0–44)
AST: 17 IU/L (ref 0–40)
Albumin: 5 g/dL (ref 4.1–5.1)
Alkaline Phosphatase: 75 IU/L (ref 44–121)
BUN/Creatinine Ratio: 15 (ref 9–20)
BUN: 16 mg/dL (ref 6–20)
Basophils Absolute: 0.1 x10E3/uL (ref 0.0–0.2)
Basos: 1 %
Bilirubin Total: 0.7 mg/dL (ref 0.0–1.2)
Calcium: 9.7 mg/dL (ref 8.7–10.2)
Chloride: 100 mmol/L (ref 96–106)
Chol/HDL Ratio: 5.5 ratio — ABNORMAL HIGH (ref 0.0–5.0)
Cholesterol, Total: 218 mg/dL — ABNORMAL HIGH (ref 100–199)
Creatinine, Ser: 1.07 mg/dL (ref 0.76–1.27)
EOS (ABSOLUTE): 0.2 x10E3/uL (ref 0.0–0.4)
Eos: 3 %
Estimated CHD Risk: 1.1 times avg. — ABNORMAL HIGH (ref 0.0–1.0)
Free Thyroxine Index: 1.9 (ref 1.2–4.9)
GGT: 14 IU/L (ref 0–65)
Globulin, Total: 2.7 g/dL (ref 1.5–4.5)
Glucose: 92 mg/dL (ref 70–99)
HDL: 40 mg/dL (ref >39)
Hematocrit: 48.7 % (ref 37.5–51.0)
Hemoglobin: 16.1 g/dL (ref 13.0–17.7)
Immature Grans (Abs): 0.1 x10E3/uL (ref 0.0–0.1)
Immature Granulocytes: 1 %
Iron: 127 ug/dL (ref 38–169)
LDH: 132 IU/L (ref 121–224)
LDL Chol Calc (NIH): 147 mg/dL — ABNORMAL HIGH (ref 0–99)
Lymphocytes Absolute: 1.8 x10E3/uL (ref 0.7–3.1)
Lymphs: 23 %
MCH: 30.3 pg (ref 26.6–33.0)
MCHC: 33.1 g/dL (ref 31.5–35.7)
MCV: 92 fL (ref 79–97)
Monocytes Absolute: 0.8 x10E3/uL (ref 0.1–0.9)
Monocytes: 10 %
Neutrophils Absolute: 5.1 x10E3/uL (ref 1.4–7.0)
Neutrophils: 62 %
Phosphorus: 2.9 mg/dL (ref 2.8–4.1)
Platelets: 320 x10E3/uL (ref 150–450)
Potassium: 5 mmol/L (ref 3.5–5.2)
RBC: 5.31 x10E6/uL (ref 4.14–5.80)
RDW: 12.4 % (ref 11.6–15.4)
Sodium: 139 mmol/L (ref 134–144)
T3 Uptake Ratio: 25 % (ref 24–39)
T4, Total: 7.6 ug/dL (ref 4.5–12.0)
TSH: 1.45 u[IU]/mL (ref 0.450–4.500)
Total Protein: 7.7 g/dL (ref 6.0–8.5)
Triglycerides: 171 mg/dL — ABNORMAL HIGH (ref 0–149)
Uric Acid: 7.6 mg/dL (ref 3.8–8.4)
VLDL Cholesterol Cal: 31 mg/dL (ref 5–40)
WBC: 8.1 x10E3/uL (ref 3.4–10.8)
eGFR: 91 mL/min/1.73 (ref >59)

## 2023-09-27 ENCOUNTER — Encounter: Payer: Self-pay | Admitting: Family Medicine

## 2023-09-27 ENCOUNTER — Encounter: Payer: Self-pay | Admitting: Physician Assistant

## 2023-09-27 ENCOUNTER — Ambulatory Visit: Payer: Self-pay | Admitting: Physician Assistant

## 2023-09-27 VITALS — BP 121/76 | HR 62 | Temp 98.4°F | Resp 12 | Ht 73.5 in | Wt 242.0 lb

## 2023-09-27 DIAGNOSIS — E782 Mixed hyperlipidemia: Secondary | ICD-10-CM

## 2023-09-27 DIAGNOSIS — Z0000 Encounter for general adult medical examination without abnormal findings: Secondary | ICD-10-CM

## 2023-09-27 MED ORDER — ROSUVASTATIN CALCIUM 20 MG PO TABS: 20.0000 mg | ORAL_TABLET | Freq: Every day | ORAL | 3 refills | Status: AC

## 2023-09-27 NOTE — Progress Notes (Signed)
 [facility]  ____________________________________________   None    (approximate)  I have reviewed the triage vital signs and the nursing notes.   HISTORY  Chief Complaint Annual Exam    HPI Timothy Fleming is a 39 y.o. male patient presents for annual physical exam.  Patient voices no concerns or complaints.         Past Medical History:  Diagnosis Date   Allergy    Seasonal   GERD (gastroesophageal reflux disease)    History of kidney stones    x4   Insomnia     Patient Active Problem List   Diagnosis Date Noted   Ankle instability 12/03/2017   Metabolic syndrome 12/13/2016   Moderate major depression (HCC) 11/30/2016   History of vasculitis of skin 05/20/2015   GERD without esophagitis 11/10/2014   Seasonal allergic rhinitis 11/10/2014   History of nephrolithiasis 11/10/2014   Obesity (BMI 30-39.9) 11/10/2014   Tobacco abuse 11/10/2014   Snoring 11/10/2014   Insomnia 11/10/2014    Past Surgical History:  Procedure Laterality Date   FACIAL RECONSTRUCTION SURGERY  01/13/2008   After a car fell on him while working on it    Prior to Admission medications   Medication Sig Start Date End Date Taking? Authorizing Provider  EPINEPHrine  0.3 mg/0.3 mL IJ SOAJ injection Inject 0.3 mg into the muscle as needed for anaphylaxis. 01/07/21  Yes Sowles, Krichna, MD  mometasone (ELOCON) 0.1 % ointment  07/04/23  Yes [provider]  omeprazole  (PRILOSEC) 20 MG capsule TAKE 1 CAPSULE(20 MG) BY MOUTH DAILY 07/17/21  Yes Sowles, Krichna, MD  tacrolimus (PROTOPIC) 0.1 % ointment APPLY TO THE AFFECTED FACIAL AREAS TWICE DAILY AS NEEDED 07/04/23  Yes [provider]    Allergies Bee venom and Penicillins  Family History  Problem Relation Age of Onset   Varicose Veins Mother    Endometriosis Mother    Drug abuse Father    GI Disease Father    Heart attack Maternal Grandmother    Breast cancer Maternal Grandmother     Thyroid  cancer Maternal Grandmother    Congestive Heart Failure Maternal Grandfather    Heart attack Maternal Grandfather    Stroke Maternal Grandfather    Breast cancer Paternal Grandmother    Heart attack Paternal Grandfather     Social History Social History   Tobacco Use   Smoking status: Former    Current packs/day: 0.00    Average packs/day: 0.3 packs/day for 17.6 years (4.4 ttl pk-yrs)    Types: Cigarettes, E-cigarettes    Start date: 11/10/1999    Quit date: 06/05/2017    Years since quitting: 6.3   Smokeless tobacco: Never   Tobacco comments:    e cig mostly  Vaping Use   Vaping status: Every Day   Start date: 11/28/2011   Substances: Nicotine, Flavoring   Devices: Box Mod  Substance Use Topics   Alcohol use: Yes    Alcohol/week: 3.0 standard drinks of alcohol    Types: 3 Standard drinks or equivalent per week   Drug use: No    Review of Systems Constitutional: No fever/chills. Eyes: No visual changes. ENT: No sore throat. Cardiovascular: Denies chest pain. Respiratory: Denies shortness of breath. Gastrointestinal: No abdominal pain.  No nausea, no vomiting.  No diarrhea.  No constipation. Genitourinary: Negative for dysuria. Musculoskeletal: Negative for back pain. Skin: Negative for rash. Neurological: Negative for headaches, focal weakness or numbness. Allergic/Immunilogical: Bee venom and penicillin  ____________________________________________   PHYSICAL EXAM:  VITAL SIGNS:  09/27/2023   8:42 AM    BP 121/76  BP Location Left Arm  Patient Position Sitting  Cuff Size Large  Pulse Rate 62  Temp 98.4 F (36.9 C)  Temp Source Temporal  Weight 242 lb (109.8 kg)  Height 6' 1.5 (1.867 m)  Resp 12  SpO2 99 %   BMI: 31.50 kg/m2  BSA: 2.39 m2   Constitutional: Alert and oriented. Well appearing and in no acute distress. Eyes: Conjunctivae are normal. PERRL. EOMI. Head: Atraumatic. Nose: No congestion/rhinnorhea. Mouth/Throat: Mucous  membranes are moist.  Oropharynx non-erythematous. Neck: No stridor.  No cervical spine tenderness to palpation. Hematological/Lymphatic/Immunilogical: No cervical lymphadenopathy. Cardiovascular: Normal rate, regular rhythm. Grossly normal heart sounds.  Good peripheral circulation. Respiratory: Normal respiratory effort.  No retractions. Lungs CTAB. Gastrointestinal: Soft and nontender. No distention. No abdominal bruits. No CVA tenderness. Genitourinary: Deferred Musculoskeletal: No lower extremity tenderness nor edema.  No joint effusions. Neurologic:  Normal speech and language. No gross focal neurologic deficits are appreciated. No gait instability. Skin:  Skin is warm, dry and intact. No rash noted. Psychiatric: Mood and affect are normal. Speech and behavior are normal.  ____________________________________________   LABS         Component Ref Range & Units (hover) 7 d ago (09/20/23) 5 yr ago (03/18/18) 6 yr ago (11/30/16) 6 yr ago (10/17/16)  Color, UA yellow yellow yellow   Clarity, UA clear clear clear   Glucose, UA Negative Negative negative R   Bilirubin, UA neg negative negative   Ketones, UA neg negative negative   Spec Grav, UA 1.015 <=1.005 Abnormal  1.010   Blood, UA neg trace negative   pH, UA 6.5 5.0 5.0   Protein, UA Negative Positive Abnormal  negative R   Urobilinogen, UA 0.2 negative Abnormal  0.2   Nitrite, UA neg negative negative   Leukocytes, UA Negative Trace Abnormal  Negative NEGATIVE R  Appearance  clear  CLEAR R  Odor  none                        Component Ref Range & Units (hover) 7 d ago (09/20/23) 5 mo ago (04/11/23) 5 mo ago (04/09/23) 5 mo ago (04/09/23) 1 yr ago (09/21/22) 1 yr ago (09/21/22) 1 yr ago (09/21/22)  Glucose 92  109 High  CM   102 High  R, CM   Uric Acid 7.6        Comment:            Therapeutic target for gout patients: <6.0  BUN 16  11   12  R   Creatinine, Ser 1.07  1.05 R   1.06 R   eGFR 91     93 R    BUN/Creatinine Ratio 15     SEE NOTE: R, CM   Sodium 139  135 R   138 R   Potassium 5.0  4.1 R   5.2 R   Chloride 100  103 R   102 R   Calcium  9.7  9.2 R   10.1 R   Phosphorus 2.9        Total Protein 7.7 7.7 R 8.2 High  R   7.8 R   Albumin 5.0  4.6 R      Globulin, Total 2.7        Bilirubin Total 0.7  0.7   1.1 R   Alkaline Phosphatase 75  63 R      LDH 132        AST 17  18 R   14 R   ALT 17  16 R   14 R   GGT 14        Iron 127        Cholesterol, Total 218 High         Triglycerides 171 High     181 High  R    HDL 40    46 R    VLDL Cholesterol Cal 31        LDL Chol Calc (NIH) 147 High         Chol/HDL Ratio 5.5 High     4.2 R    Comment:                                   T. Chol/HDL Ratio                                             Men  Women                               1/2 Avg.Risk  3.4    3.3                                   Avg.Risk  5.0    4.4                                2X Avg.Risk  9.6    7.1                                3X Avg.Risk 23.4   11.0  Estimated CHD Risk 1.1 High         Comment: The CHD Risk is based on the T. Chol/HDL ratio. Other factors affect CHD Risk such as hypertension, smoking, diabetes, severe obesity, and family history of premature CHD.  TSH 1.450        T4, Total 7.6        T3 Uptake Ratio 25        Free Thyroxine Index 1.9        WBC 8.1   7.9 R   8.3 R  RBC 5.31   5.38 R   5.40 R  Hemoglobin 16.1   15.9 R   15.9 R  Hematocrit 48.7   46.6 R   48.1 R  MCV 92   86.6 R   89.1 R  MCH 30.3   29.6 R   29.4 R  MCHC 33.1   34.1 R   33.1 R  RDW 12.4   12.5 R   12.6 R  Platelets 320   285 R   341 R  Neutrophils 62   61 R   59.8 R  Lymphs 23        Monocytes 10        Eos 3        Basos 1  Neutrophils Absolute 5.1   4.9 R   4,963 R  Lymphocytes Absolute 1.8   2.0 R   2,191 R  Monocytes Absolute 0.8        EOS (ABSOLUTE) 0.2        Basophils Absolute 0.1   0.1 R   50 R  Immature Granulocytes 1   0 R     Immature Grans  (Abs) 0.1                   ____________________________________________    ____________________________________________   INITIAL IMPRESSION / ASSESSMENT AND PLAN  As part of my medical decision making, I reviewed the following data within the electronic MEDICAL RECORD NUMBER      No acute findings on physical exam.  Labs reveal mixed hyperlipidemia.  Patient is amenable to a low-dose statin follow-up in 3 months.        ____________________________________________   FINAL CLINICAL IMPRESSION   Well exam   ED Discharge Orders     None        Note:  This document was prepared using Dragon voice recognition software and may include unintentional dictation errors.

## 2023-09-28 ENCOUNTER — Encounter: Payer: Self-pay | Admitting: Family Medicine

## 2023-12-27 ENCOUNTER — Other Ambulatory Visit: Payer: Self-pay
